# Patient Record
Sex: Male | Born: 1940
Health system: Southern US, Community
[De-identification: ages and names within clinical notes are randomized; demographics above are authoritative.]

## PROBLEM LIST (undated history)

## (undated) DIAGNOSIS — I4510 Unspecified right bundle-branch block: Secondary | ICD-10-CM

## (undated) DIAGNOSIS — J189 Pneumonia, unspecified organism: Secondary | ICD-10-CM

## (undated) DIAGNOSIS — K219 Gastro-esophageal reflux disease without esophagitis: Secondary | ICD-10-CM

## (undated) DIAGNOSIS — C9000 Multiple myeloma not having achieved remission: Secondary | ICD-10-CM

## (undated) DIAGNOSIS — Z8719 Personal history of other diseases of the digestive system: Secondary | ICD-10-CM

## (undated) DIAGNOSIS — J449 Chronic obstructive pulmonary disease, unspecified: Secondary | ICD-10-CM

## (undated) DIAGNOSIS — R29898 Other symptoms and signs involving the musculoskeletal system: Secondary | ICD-10-CM

## (undated) DIAGNOSIS — R7303 Prediabetes: Secondary | ICD-10-CM

## (undated) DIAGNOSIS — Z974 Presence of external hearing-aid: Secondary | ICD-10-CM

## (undated) DIAGNOSIS — I10 Essential (primary) hypertension: Secondary | ICD-10-CM

## (undated) DIAGNOSIS — E039 Hypothyroidism, unspecified: Secondary | ICD-10-CM

## (undated) DIAGNOSIS — Z87442 Personal history of urinary calculi: Secondary | ICD-10-CM

## (undated) DIAGNOSIS — W3400XA Accidental discharge from unspecified firearms or gun, initial encounter: Secondary | ICD-10-CM

## (undated) DIAGNOSIS — H409 Unspecified glaucoma: Secondary | ICD-10-CM

## (undated) DIAGNOSIS — I714 Abdominal aortic aneurysm, without rupture, unspecified: Secondary | ICD-10-CM

## (undated) DIAGNOSIS — M1990 Unspecified osteoarthritis, unspecified site: Secondary | ICD-10-CM

## (undated) DIAGNOSIS — R0600 Dyspnea, unspecified: Secondary | ICD-10-CM

## (undated) DIAGNOSIS — M75100 Unspecified rotator cuff tear or rupture of unspecified shoulder, not specified as traumatic: Secondary | ICD-10-CM

## (undated) DIAGNOSIS — I700 Atherosclerosis of aorta: Secondary | ICD-10-CM

## (undated) DIAGNOSIS — Y249XXA Unspecified firearm discharge, undetermined intent, initial encounter: Secondary | ICD-10-CM

## (undated) DIAGNOSIS — E119 Type 2 diabetes mellitus without complications: Secondary | ICD-10-CM

## (undated) HISTORY — PX: THYROIDECTOMY: SHX17

## (undated) HISTORY — PX: CARPAL TUNNEL RELEASE: SHX101

## (undated) HISTORY — PX: BONE MARROW TRANSPLANT: SHX200

## (undated) HISTORY — PX: ABDOMINAL SURGERY: SHX537

## (undated) HISTORY — DX: Multiple myeloma not having achieved remission: C90.00

## (undated) HISTORY — PX: HERNIA REPAIR: SHX51

## (undated) HISTORY — DX: Chronic obstructive pulmonary disease, unspecified: J44.9

## (undated) HISTORY — DX: Abdominal aortic aneurysm, without rupture: I71.4

## (undated) HISTORY — PX: MIDDLE EAR SURGERY: SHX713

## (undated) HISTORY — DX: Unspecified rotator cuff tear or rupture of unspecified shoulder, not specified as traumatic: M75.100

## (undated) HISTORY — DX: Abdominal aortic aneurysm, without rupture, unspecified: I71.40

---

## 1998-03-25 DIAGNOSIS — E890 Postprocedural hypothyroidism: Secondary | ICD-10-CM | POA: Insufficient documentation

## 1998-03-25 DIAGNOSIS — J449 Chronic obstructive pulmonary disease, unspecified: Secondary | ICD-10-CM | POA: Insufficient documentation

## 1998-03-25 DIAGNOSIS — K219 Gastro-esophageal reflux disease without esophagitis: Secondary | ICD-10-CM | POA: Insufficient documentation

## 2003-03-26 DIAGNOSIS — C9001 Multiple myeloma in remission: Secondary | ICD-10-CM | POA: Insufficient documentation

## 2006-03-07 ENCOUNTER — Emergency Department: Payer: Self-pay | Admitting: Unknown Physician Specialty

## 2006-03-25 DIAGNOSIS — Z9481 Bone marrow transplant status: Secondary | ICD-10-CM

## 2006-03-25 HISTORY — DX: Bone marrow transplant status: Z94.81

## 2006-10-24 HISTORY — PX: BONE MARROW TRANSPLANT: SHX200

## 2007-02-23 HISTORY — PX: BONE MARROW TRANSPLANT: SHX200

## 2007-12-24 ENCOUNTER — Ambulatory Visit: Payer: Self-pay | Admitting: Internal Medicine

## 2007-12-25 ENCOUNTER — Inpatient Hospital Stay: Payer: Self-pay | Admitting: Internal Medicine

## 2007-12-25 ENCOUNTER — Other Ambulatory Visit: Payer: Self-pay

## 2007-12-27 DIAGNOSIS — Z86711 Personal history of pulmonary embolism: Secondary | ICD-10-CM | POA: Insufficient documentation

## 2008-01-08 ENCOUNTER — Ambulatory Visit: Payer: Self-pay | Admitting: Internal Medicine

## 2008-01-24 ENCOUNTER — Ambulatory Visit: Payer: Self-pay | Admitting: Internal Medicine

## 2008-02-23 ENCOUNTER — Ambulatory Visit: Payer: Self-pay | Admitting: Internal Medicine

## 2008-03-25 ENCOUNTER — Ambulatory Visit: Payer: Self-pay | Admitting: Internal Medicine

## 2008-03-25 DIAGNOSIS — I2699 Other pulmonary embolism without acute cor pulmonale: Secondary | ICD-10-CM

## 2008-03-25 HISTORY — DX: Other pulmonary embolism without acute cor pulmonale: I26.99

## 2008-03-26 LAB — HM COLONOSCOPY

## 2008-03-31 ENCOUNTER — Ambulatory Visit: Payer: Self-pay | Admitting: Internal Medicine

## 2008-04-20 ENCOUNTER — Ambulatory Visit: Payer: Self-pay | Admitting: Family Medicine

## 2008-04-25 ENCOUNTER — Ambulatory Visit: Payer: Self-pay | Admitting: Internal Medicine

## 2008-05-23 ENCOUNTER — Ambulatory Visit: Payer: Self-pay | Admitting: Internal Medicine

## 2008-06-23 ENCOUNTER — Ambulatory Visit: Payer: Self-pay | Admitting: Internal Medicine

## 2008-07-23 ENCOUNTER — Ambulatory Visit: Payer: Self-pay | Admitting: Internal Medicine

## 2008-08-23 ENCOUNTER — Ambulatory Visit: Payer: Self-pay | Admitting: Internal Medicine

## 2008-09-22 ENCOUNTER — Ambulatory Visit: Payer: Self-pay | Admitting: Internal Medicine

## 2008-10-23 ENCOUNTER — Ambulatory Visit: Payer: Self-pay | Admitting: Internal Medicine

## 2008-11-23 ENCOUNTER — Ambulatory Visit: Payer: Self-pay | Admitting: Internal Medicine

## 2008-12-23 ENCOUNTER — Ambulatory Visit: Payer: Self-pay | Admitting: Internal Medicine

## 2009-01-23 ENCOUNTER — Ambulatory Visit: Payer: Self-pay | Admitting: Internal Medicine

## 2009-02-22 ENCOUNTER — Ambulatory Visit: Payer: Self-pay | Admitting: Internal Medicine

## 2009-03-25 ENCOUNTER — Ambulatory Visit: Payer: Self-pay | Admitting: Internal Medicine

## 2009-05-23 ENCOUNTER — Ambulatory Visit: Payer: Self-pay | Admitting: Internal Medicine

## 2009-05-26 ENCOUNTER — Ambulatory Visit: Payer: Self-pay | Admitting: Internal Medicine

## 2009-06-23 ENCOUNTER — Ambulatory Visit: Payer: Self-pay | Admitting: Internal Medicine

## 2009-07-28 DIAGNOSIS — R5383 Other fatigue: Secondary | ICD-10-CM | POA: Insufficient documentation

## 2009-08-02 ENCOUNTER — Ambulatory Visit: Payer: Self-pay | Admitting: Family Medicine

## 2009-08-17 IMAGING — NM NM LUNG SCAN
2 series · 15 of 15 positions shown · non-contrast
Comparison: none

REASON FOR EXAM: hypoxemia
COMMENTS:

[Series 1000: lung ventilation · 3.30mm/px · 4 acquisitions, 7 frames shown]
[im 1/4]
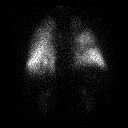
[im 2/4]
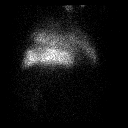
[im 2/4]
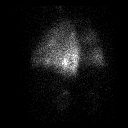
[im 3/4]
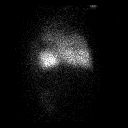
[im 3/4]
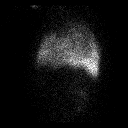
[im 4/4  full-range]
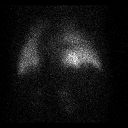
[im 4/4  full-range]
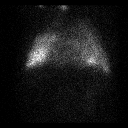

[Series 1000: lung perfusion · 1.65mm/px · 4 acquisitions, 8 frames shown]
[im 1/4]
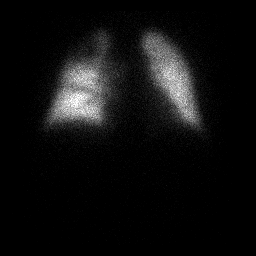
[im 1/4]
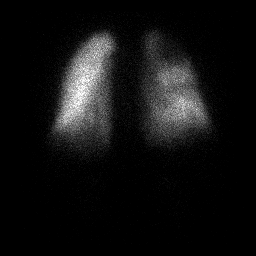
[im 2/4]
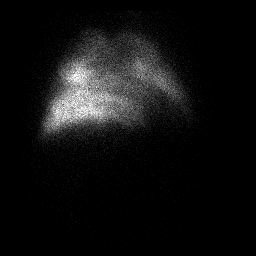
[im 2/4]
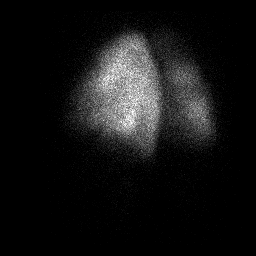
[im 3/4]
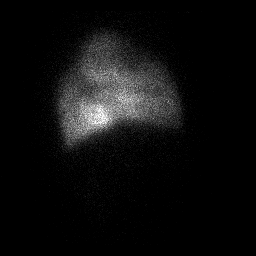
[im 3/4]
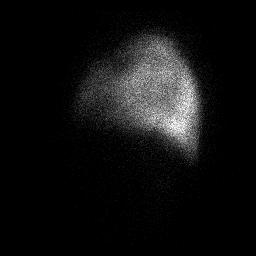
[im 4/4]
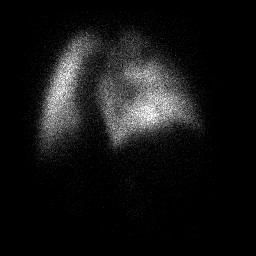
[im 4/4]
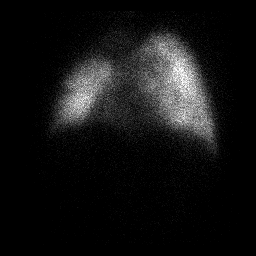

[15 of 15 positions shown; findings below may reference images not displayed]

PROCEDURE:     NM  - NM VQ LUNG SCAN  - [DATE]  [DATE] [DATE]  [DATE]

RESULT:     Ventilation/perfusion lung scan is performed utilizing 37.5 mCi
Tc 99m DTPA in the nebulizer for the ventilation portion and 4.0 mCi of
technetium 99m labeled MAA for the perfusion portion. AP and lateral views
of the chest from 12/26/2007 are available. No current chest x-ray is
available.

The chest x-ray demonstrates increased density in the superior segment of
the RIGHT lower lobe consistent with pneumonia. The lungs otherwise appear
to be grossly clear. The ventilation images show patchy decreased activity
in the superior segment of the RIGHT lower lobe and in the RIGHT upper lobe.
The LEFT lung shows grossly normal tracer localization.

Perfusion images also show abnormal, decreased localization in the RIGHT
upper lobe and superior segment of the RIGHT lower lobe. Correlation with a
current chest x-ray to evaluate for infiltrate in the RIGHT upper lobe would
be helpful.
IMPRESSION: Abnormal ventilation/perfusion lung scan. Decreased
ventilation and perfusion to the RIGHT upper lobe and superior segment of
the RIGHT lower lobe. The probability of pulmonary embolism is intermediate.
Given there is no current chest x-ray for which to compare the RIGHT upper
lobe cannot be evaluated for the possibility of underlying pneumonia.
Activity in the superior segment of the RIGHT lower lobe is a triple matched
defect and likely not accountable or explained because of pulmonary embolism.

## 2009-08-23 ENCOUNTER — Ambulatory Visit: Payer: Self-pay | Admitting: Internal Medicine

## 2009-08-25 ENCOUNTER — Ambulatory Visit: Payer: Self-pay | Admitting: Internal Medicine

## 2009-08-29 DIAGNOSIS — D509 Iron deficiency anemia, unspecified: Secondary | ICD-10-CM | POA: Insufficient documentation

## 2009-09-22 ENCOUNTER — Ambulatory Visit: Payer: Self-pay | Admitting: Internal Medicine

## 2010-01-09 ENCOUNTER — Ambulatory Visit: Payer: Self-pay | Admitting: Internal Medicine

## 2010-01-23 ENCOUNTER — Ambulatory Visit: Payer: Self-pay | Admitting: Internal Medicine

## 2010-05-11 ENCOUNTER — Ambulatory Visit: Payer: Self-pay | Admitting: Internal Medicine

## 2010-05-24 ENCOUNTER — Ambulatory Visit: Payer: Self-pay | Admitting: Internal Medicine

## 2010-08-01 ENCOUNTER — Ambulatory Visit: Payer: Self-pay | Admitting: Family Medicine

## 2010-11-05 ENCOUNTER — Ambulatory Visit: Payer: Self-pay | Admitting: Internal Medicine

## 2010-11-24 ENCOUNTER — Ambulatory Visit: Payer: Self-pay | Admitting: Internal Medicine

## 2010-12-14 ENCOUNTER — Ambulatory Visit: Payer: Self-pay | Admitting: Family Medicine

## 2011-02-25 ENCOUNTER — Ambulatory Visit: Payer: Self-pay | Admitting: Unknown Physician Specialty

## 2011-05-13 ENCOUNTER — Ambulatory Visit: Payer: Self-pay | Admitting: Internal Medicine

## 2011-05-13 LAB — CBC CANCER CENTER
Basophil %: 1.3 %
Eosinophil %: 2.8 %
HCT: 44.9 % (ref 40.0–52.0)
HGB: 15.1 g/dL (ref 13.0–18.0)
Lymphocyte %: 31.6 %
MCH: 31.2 pg (ref 26.0–34.0)
MCV: 93 fL (ref 80–100)
Monocyte #: 0.4 x10 3/mm (ref 0.0–0.7)
Monocyte %: 5.8 %
Neutrophil %: 58.5 %
Platelet: 152 x10 3/mm (ref 150–440)
RBC: 4.84 10*6/uL (ref 4.40–5.90)

## 2011-05-13 LAB — COMPREHENSIVE METABOLIC PANEL
Albumin: 3.6 g/dL (ref 3.4–5.0)
Alkaline Phosphatase: 115 U/L (ref 50–136)
Anion Gap: 5 — ABNORMAL LOW (ref 7–16)
BUN: 14 mg/dL (ref 7–18)
Bilirubin,Total: 0.6 mg/dL (ref 0.2–1.0)
Co2: 31 mmol/L (ref 21–32)
Creatinine: 1.31 mg/dL — ABNORMAL HIGH (ref 0.60–1.30)
EGFR (Non-African Amer.): 57 — ABNORMAL LOW
Glucose: 142 mg/dL — ABNORMAL HIGH (ref 65–99)
Osmolality: 284 (ref 275–301)
SGOT(AST): 19 U/L (ref 15–37)
Sodium: 141 mmol/L (ref 136–145)

## 2011-05-24 ENCOUNTER — Ambulatory Visit: Payer: Self-pay | Admitting: Internal Medicine

## 2011-05-24 ENCOUNTER — Ambulatory Visit: Payer: Self-pay | Admitting: Oncology

## 2011-06-24 ENCOUNTER — Ambulatory Visit: Payer: Self-pay | Admitting: Internal Medicine

## 2011-06-24 ENCOUNTER — Ambulatory Visit: Payer: Self-pay | Admitting: Oncology

## 2011-07-01 ENCOUNTER — Ambulatory Visit: Payer: Self-pay | Admitting: Family Medicine

## 2011-11-29 ENCOUNTER — Ambulatory Visit: Payer: Self-pay | Admitting: Oncology

## 2011-11-29 LAB — CBC CANCER CENTER
Basophil %: 0.8 %
Eosinophil #: 0.1 x10 3/mm (ref 0.0–0.7)
HGB: 14.4 g/dL (ref 13.0–18.0)
Lymphocyte %: 34.1 %
MCHC: 32.5 g/dL (ref 32.0–36.0)
MCV: 93 fL (ref 80–100)
Monocyte #: 0.4 x10 3/mm (ref 0.2–1.0)
Neutrophil #: 3 x10 3/mm (ref 1.4–6.5)
Neutrophil %: 55 %
RBC: 4.75 10*6/uL (ref 4.40–5.90)
WBC: 5.5 x10 3/mm (ref 3.8–10.6)

## 2011-11-29 LAB — BASIC METABOLIC PANEL
Anion Gap: 5 — ABNORMAL LOW (ref 7–16)
BUN: 17 mg/dL (ref 7–18)
Chloride: 105 mmol/L (ref 98–107)
Co2: 30 mmol/L (ref 21–32)
Creatinine: 1.29 mg/dL (ref 0.60–1.30)
EGFR (African American): >60
Potassium: 4.2 mmol/L (ref 3.5–5.1)

## 2011-12-02 LAB — PROT IMMUNOELECTROPHORES(ARMC)

## 2011-12-24 ENCOUNTER — Ambulatory Visit: Payer: Self-pay | Admitting: Oncology

## 2012-07-15 ENCOUNTER — Ambulatory Visit: Payer: Self-pay | Admitting: Ophthalmology

## 2012-08-18 ENCOUNTER — Ambulatory Visit: Payer: Self-pay | Admitting: Family Medicine

## 2014-03-29 ENCOUNTER — Ambulatory Visit: Payer: Self-pay | Admitting: Family Medicine

## 2014-03-29 LAB — BASIC METABOLIC PANEL
BUN: 15 mg/dL (ref 4–21)
GLUCOSE: 102 mg/dL
Potassium: 4.4 mmol/L (ref 3.4–5.3)
SODIUM: 139 mmol/L (ref 137–147)

## 2014-03-29 LAB — CBC AND DIFFERENTIAL
HCT: 45 % (ref 41–53)
HEMOGLOBIN: 14.8 g/dL (ref 13.5–17.5)
Platelets: 152 10*3/uL (ref 150–399)
WBC: 6.8 10^3/mL

## 2014-03-29 LAB — HEPATIC FUNCTION PANEL
ALT: 12 U/L (ref 10–40)
AST: 17 U/L (ref 14–40)

## 2014-03-29 LAB — HEMOGLOBIN A1C: Hgb A1c MFr Bld: 6.3 % — AB (ref 4.0–6.0)

## 2014-06-24 ENCOUNTER — Ambulatory Visit: Payer: Self-pay

## 2014-07-20 LAB — TSH: TSH: 16.7 u[IU]/mL — AB (ref <=5.90)

## 2014-07-29 ENCOUNTER — Telehealth: Payer: Self-pay | Admitting: Family Medicine

## 2014-08-12 NOTE — Telephone Encounter (Signed)
A user error has taken place: encounter opened in error, closed for administrative reasons.

## 2014-09-11 ENCOUNTER — Telehealth: Payer: Self-pay | Admitting: Family Medicine

## 2014-09-11 DIAGNOSIS — E039 Hypothyroidism, unspecified: Secondary | ICD-10-CM

## 2014-09-11 NOTE — Telephone Encounter (Signed)
Please advise patient it is time to recheck thyroid functions. Please enter order for TSH for hypothyroid and have patient pick up rx at front desk. Thanks.

## 2014-09-13 NOTE — Telephone Encounter (Signed)
Left message on vm. Order entered and req at front desk.

## 2014-09-14 LAB — TSH: TSH: 3.54 u[IU]/mL (ref 0.450–4.500)

## 2014-09-15 ENCOUNTER — Telehealth: Payer: Self-pay | Admitting: *Deleted

## 2014-09-15 NOTE — Telephone Encounter (Signed)
Patient's wife Ryan Ali requested refill for pt's Levothyroxine 125 mcg. Requesting 90 day supply sent to [facility] in [place].

## 2014-09-16 MED ORDER — LEVOTHYROXINE SODIUM 125 MCG PO TABS: 125.0000 ug | ORAL_TABLET | Freq: Every day | ORAL | Status: DC

## 2014-12-16 ENCOUNTER — Encounter: Payer: Self-pay | Admitting: Family Medicine

## 2014-12-16 ENCOUNTER — Ambulatory Visit (INDEPENDENT_AMBULATORY_CARE_PROVIDER_SITE_OTHER): Payer: PPO | Admitting: Family Medicine

## 2014-12-16 VITALS — BP 102/68 | HR 90 | Temp 98.1°F | Resp 16 | Ht 67.0 in | Wt 221.0 lb

## 2014-12-16 DIAGNOSIS — I714 Abdominal aortic aneurysm, without rupture, unspecified: Secondary | ICD-10-CM | POA: Insufficient documentation

## 2014-12-16 DIAGNOSIS — E119 Type 2 diabetes mellitus without complications: Secondary | ICD-10-CM | POA: Insufficient documentation

## 2014-12-16 DIAGNOSIS — T8600 Unspecified complication of bone marrow transplant: Secondary | ICD-10-CM | POA: Insufficient documentation

## 2014-12-16 DIAGNOSIS — J441 Chronic obstructive pulmonary disease with (acute) exacerbation: Secondary | ICD-10-CM | POA: Insufficient documentation

## 2014-12-16 DIAGNOSIS — J449 Chronic obstructive pulmonary disease, unspecified: Secondary | ICD-10-CM | POA: Diagnosis not present

## 2014-12-16 DIAGNOSIS — G4710 Hypersomnia, unspecified: Secondary | ICD-10-CM | POA: Insufficient documentation

## 2014-12-16 DIAGNOSIS — C9001 Multiple myeloma in remission: Secondary | ICD-10-CM | POA: Diagnosis not present

## 2014-12-16 DIAGNOSIS — R7303 Prediabetes: Secondary | ICD-10-CM | POA: Insufficient documentation

## 2014-12-16 DIAGNOSIS — M542 Cervicalgia: Secondary | ICD-10-CM | POA: Insufficient documentation

## 2014-12-16 DIAGNOSIS — K2270 Barrett's esophagus without dysplasia: Secondary | ICD-10-CM | POA: Insufficient documentation

## 2014-12-16 DIAGNOSIS — M1990 Unspecified osteoarthritis, unspecified site: Secondary | ICD-10-CM | POA: Insufficient documentation

## 2014-12-16 MED ORDER — AZITHROMYCIN 250 MG PO TABS: ORAL_TABLET | ORAL | Status: AC

## 2014-12-16 MED ORDER — PREDNISONE 10 MG PO TABS: ORAL_TABLET | ORAL | Status: AC

## 2014-12-16 NOTE — Progress Notes (Signed)
Patient ID: Ryan Ali, male   DOB: 1940/09/13, 74 y.o.   MRN: [id number]       Patient: Ryan Ali Male    DOB: August 30, 1940   74 y.o.   MRN: [id number] Visit Date: 12/16/2014  Today's Provider: [name], MD   Chief Complaint  Patient presents with  . Cough    X 5 days.    Subjective:    Cough This is a new problem. The current episode started in the past 7 days. The problem has been gradually worsening. The problem occurs constantly. The cough is productive of sputum. Associated symptoms include postnasal drip and shortness of breath. Pertinent negatives include no fever, headaches or sore throat. Nothing aggravates the symptoms. He has tried OTC cough suppressant for the symptoms. The treatment provided mild relief. His past medical history is significant for COPD.  He had been traveling last week and flew home last week-end. Cough and wheezing started the next day. Has had no allergy or other URI symptoms. Is using Symbicort prescribed by the [facility] consistently. Occasionally using Proventil.      Allergies no known allergies Previous Medications   ALBUTEROL (PROVENTIL HFA) 108 (90 BASE) MCG/ACT INHALER    PROVENTIL HFA, 108 (90 Base)MCG/ACT (Inhalation Aerosol Solution)  for 0 days  Quantity: 0.00;  Refills: 0   Ordered :16-Jan-2010  [name] ;  Started 13-Jan-2008 Active   ASPIRIN 81 MG TABLET    Take 1 tablet by mouth daily.   CALCIUM CARBONATE-VIT D-MIN (CALCIUM 600+D PLUS MINERALS) 600-400 MG-UNIT CHEW    Chew 3 tablets by mouth daily.   GLUCOSE BLOOD VI    Freestyle test strips. Check blood sugar daily.   HYDROCODONE-ACETAMINOPHEN (VICODIN) 5-500 MG PER TABLET    Take 2 tablets by mouth Nightly.   LEVOTHYROXINE (SYNTHROID, LEVOTHROID) 125 MCG TABLET    Take 1 tablet (125 mcg total) by mouth daily before breakfast.   Symbicort Inhaler    Prescribed at [place]. Patient does not recall dose.    OMEPRAZOLE 20 MG TBEC    Take 2 tablets by  mouth daily.   TIOTROPIUM (SPIRIVA HANDIHALER) [place], 18MCG (Inhalation Capsule)  for 0 days  Quantity: 0.00;  Refills: 0   Ordered :16-Jan-2010  [name] ;  Started 13-Jan-2008 Active    Review of Systems  Constitutional: Positive for activity change and appetite change. Negative for fever.  HENT: Positive for postnasal drip. Negative for sore throat.   Respiratory: Positive for cough and shortness of breath.   Cardiovascular: Negative.   Gastrointestinal: Positive for nausea.  Neurological: Negative for headaches.    Social History  Substance Use Topics  . Smoking status: Former Smoker    Quit date: 03/25/2005  . Smokeless tobacco: Not on file  . Alcohol Use: No   Objective:   BP 102/68 mmHg  Pulse 90  Temp(Src) 98.1 F (36.7 C)  Resp 16  Ht _0  (1.702 m)  Wt 221 lb (100.245 kg)  BMI 34.61 kg/m2  SpO2 95%  Physical Exam   General Appearance:    Alert, cooperative, no distress  HEENT:   Clear. No congestion or drainage, No LAD  Eyes:    PERRL, conjunctiva/corneas clear, EOM's intact       Lungs:     Diminished breath sounds. Mild diffuse expiratory wheezes respirations unlabored  Heart:    Regular rate and rhythm, distant heart sounds.   Neurologic:  Awake, alert, oriented x 3. No apparent focal neurological           defect.            Assessment & Plan:     1. COPD with exacerbation  - predniSONE (DELTASONE) 10 MG tablet; 6 tablets for 2 days, then 5 for 2 days, then 4 for 2 days, then 3 for 2 days, then 2 for 2 days, then 1 for 2 days.  Dispense: 42 tablet; Refill: 0 - azithromycin (ZITHROMAX) 250 MG tablet; 2 by mouth today, then 1 daily for 4 days  Dispense: 6 tablet; Refill: 0  2. Multiple myeloma in remission Return for flu vaccine when finished with prednisone  3. Chronic obstructive pulmonary disease, unspecified COPD, unspecified chronic bronchitis type Continue current maintenance  inhalers.     Call if symptoms change or if not rapidly improving.          [name], MD  [facility]

## 2015-05-12 ENCOUNTER — Ambulatory Visit (INDEPENDENT_AMBULATORY_CARE_PROVIDER_SITE_OTHER): Payer: PPO | Admitting: Family Medicine

## 2015-05-12 ENCOUNTER — Encounter: Payer: Self-pay | Admitting: Family Medicine

## 2015-05-12 VITALS — BP 130/86 | HR 88 | Temp 98.0°F | Resp 18 | Wt 227.0 lb

## 2015-05-12 DIAGNOSIS — E890 Postprocedural hypothyroidism: Secondary | ICD-10-CM | POA: Diagnosis not present

## 2015-05-12 DIAGNOSIS — J449 Chronic obstructive pulmonary disease, unspecified: Secondary | ICD-10-CM | POA: Diagnosis not present

## 2015-05-12 DIAGNOSIS — C9001 Multiple myeloma in remission: Secondary | ICD-10-CM | POA: Diagnosis not present

## 2015-05-12 DIAGNOSIS — E119 Type 2 diabetes mellitus without complications: Secondary | ICD-10-CM | POA: Diagnosis not present

## 2015-05-12 DIAGNOSIS — Z125 Encounter for screening for malignant neoplasm of prostate: Secondary | ICD-10-CM | POA: Diagnosis not present

## 2015-05-12 DIAGNOSIS — D509 Iron deficiency anemia, unspecified: Secondary | ICD-10-CM

## 2015-05-12 LAB — POCT UA - MICROALBUMIN: Microalbumin Ur, POC: 20 mg/L

## 2015-05-12 NOTE — Progress Notes (Signed)
Patient: Ryan Ali Male    DOB: 1940/06/15   75 y.o.   MRN: [id number] Visit Date: 05/12/2015  Today's Provider: [name], MD   Chief Complaint  Patient presents with  . Diabetes    follow up  . Hypothyroidism    follow up   Subjective:    HPI  Diabetes Mellitus Type II, Follow-up:   Lab Results  Component Value Date   HGBA1C 6.3* 03/29/2014    Last seen for diabetes 1 years ago.  Management since then includes no changes. He reports good compliance with treatment. He is not having side effects.  Current symptoms include paresthesia of the feet and have been stable. Home blood sugar records: fasting range: not being checked  Episodes of hypoglycemia? no   Current Insulin Regimen: none Most Recent Eye Exam:  1 year ago Weight trend: increasing steadily Prior visit with dietician: no Current diet: in general, an "unhealthy" diet Current exercise: none  Pertinent Labs:    Component Value Date/Time   CREATININE 1.29 11/29/2011 0851    Wt Readings from Last 3 Encounters:  05/12/15 227 lb (102.967 kg)  12/16/14 221 lb (100.245 kg)  03/29/14 218 lb (98.884 kg)    ------------------------------------------------------------------------  Follow up Hypothyroidism:  Last office visit was 1 year ago. Labs were last checked on 09/13/2014. TSH was 3.540 and no changes were made. Patent reports good compliance with treatment and good tolerance. He states he has been much more fatigued lately. Is followed by Oncology at [facility] and states hemoglobin is in the 14-15 range.   COPD Still gets short of breath on exertion, but no worse over the last few year. PCP at [facility] has added Symbicort which helps. Occasionally uses albuterol.   Elevated BP States BP was very high at oncologist and when getting PT for shoulder.      No Known Allergies Previous Medications   ALBUTEROL (PROVENTIL HFA) 108 (90 BASE) MCG/ACT INHALER    PROVENTIL HFA, 108 (90 Base)MCG/ACT  (Inhalation Aerosol Solution)  for 0 days  Quantity: 0.00;  Refills: 0   Ordered :16-Jan-2010  [name] ;  Started 13-Jan-2008 Active   ASPIRIN 81 MG TABLET    Take 1 tablet by mouth daily. Reported on 05/12/2015   CALCIUM CARBONATE-VIT D-MIN (CALCIUM 600+D PLUS MINERALS) 600-400 MG-UNIT CHEW    Chew 3 tablets by mouth daily.   GLUCOSE BLOOD VI    Freestyle test strips. Check blood sugar daily.   HYDROCODONE-ACETAMINOPHEN (VICODIN) 5-500 MG PER TABLET    Take 2 tablets by mouth Nightly.   LEVOTHYROXINE (SYNTHROID, LEVOTHROID) 125 MCG TABLET    Take 1 tablet (125 mcg total) by mouth daily before breakfast.   OMEPRAZOLE 20 MG TBEC    Take 2 tablets by mouth daily.   TIOTROPIUM (SPIRIVA HANDIHALER) [place], 18MCG (Inhalation Capsule)  for 0 days  Quantity: 0.00;  Refills: 0   Ordered :16-Jan-2010  [name] ;  Started 13-Jan-2008 Active    Review of Systems  Constitutional: Negative for fever, chills and appetite change.  Respiratory: Negative for chest tightness, shortness of breath and wheezing.   Cardiovascular: Negative for chest pain and palpitations.  Gastrointestinal: Negative for nausea, vomiting and abdominal pain.  Endocrine: Negative for cold intolerance, heat intolerance, polydipsia and polyuria.  Musculoskeletal:       Joints lock up in hands occasionally  Neurological: Positive for headaches. Negative for dizziness and light-headedness.  Social History  Substance Use Topics  . Smoking status: Former Smoker    Quit date: 03/25/2005  . Smokeless tobacco: Not on file  . Alcohol Use: No   Objective:   BP 138/80 mmHg  Pulse 88  Temp(Src) 98 F (36.7 C) (Oral)  Resp 18  Wt 227 lb (102.967 kg)  Physical Exam  General Appearance:    Alert, cooperative, no distress, obese  Eyes:    PERRL, conjunctiva/corneas clear, EOM's intact       Lungs:     Clear to auscultation bilaterally, respirations unlabored    Heart:    Regular rate and rhythm  Neurologic:   Awake, alert, oriented x 3. No apparent focal neurological           defect.         Results for orders placed or performed in visit on 05/12/15  POCT UA - Microalbumin  Result Value Ref Range   Microalbumin Ur, POC 20 mg/L   Creatinine, POC n/a mg/dL   Albumin/Creatinine Ratio, Urine, POC n/a        Assessment & Plan:     1. Controlled type 2 diabetes mellitus without complication, without long-term current use of insulin (HCC)  - POCT UA - Microalbumin - Basic metabolic panel - Hemoglobin A1c - Lipid panel  2. Multiple myeloma in remission [facility]) Continue routine follow up at [facility]  3. Hypothyroidism, postop  - TSH  4. Chronic obstructive pulmonary disease, unspecified COPD type ([place]) Stable on current inhalers.   5. Prostate cancer screening  - PSA       [name], MD  [facility]

## 2015-05-16 DIAGNOSIS — E119 Type 2 diabetes mellitus without complications: Secondary | ICD-10-CM | POA: Diagnosis not present

## 2015-05-16 DIAGNOSIS — Z125 Encounter for screening for malignant neoplasm of prostate: Secondary | ICD-10-CM | POA: Diagnosis not present

## 2015-05-16 DIAGNOSIS — E890 Postprocedural hypothyroidism: Secondary | ICD-10-CM | POA: Diagnosis not present

## 2015-05-17 LAB — BASIC METABOLIC PANEL
BUN / CREAT RATIO: 13 (ref 10–22)
BUN: 17 mg/dL (ref 8–27)
CALCIUM: 8.9 mg/dL (ref 8.6–10.2)
CO2: 25 mmol/L (ref 18–29)
CREATININE: 1.29 mg/dL — AB (ref 0.76–1.27)
Chloride: 101 mmol/L (ref 96–106)
GFR calc non Af Amer: 54 mL/min/{1.73_m2} — ABNORMAL LOW (ref >59)
GFR, EST AFRICAN AMERICAN: 62 mL/min/{1.73_m2} (ref >59)
GLUCOSE: 98 mg/dL (ref 65–99)
Potassium: 4.6 mmol/L (ref 3.5–5.2)
SODIUM: 142 mmol/L (ref 134–144)

## 2015-05-17 LAB — LIPID PANEL
CHOLESTEROL TOTAL: 184 mg/dL (ref 100–199)
Chol/HDL Ratio: 4.2 ratio units (ref 0.0–5.0)
HDL: 44 mg/dL (ref >39)
LDL CALC: 120 mg/dL — AB (ref 0–99)
TRIGLYCERIDES: 102 mg/dL (ref 0–149)
VLDL Cholesterol Cal: 20 mg/dL (ref 5–40)

## 2015-05-17 LAB — TSH: TSH: 36.22 u[IU]/mL — AB (ref 0.450–4.500)

## 2015-05-17 LAB — HEMOGLOBIN A1C
Est. average glucose Bld gHb Est-mCnc: 137 mg/dL
HEMOGLOBIN A1C: 6.4 % — AB (ref 4.8–5.6)

## 2015-05-17 LAB — PSA: PROSTATE SPECIFIC AG, SERUM: 0.5 ng/mL (ref 0.0–4.0)

## 2015-05-18 ENCOUNTER — Telehealth: Payer: Self-pay

## 2015-05-18 DIAGNOSIS — E039 Hypothyroidism, unspecified: Secondary | ICD-10-CM

## 2015-05-18 MED ORDER — LEVOTHYROXINE SODIUM 150 MCG PO TABS: 150.0000 ug | ORAL_TABLET | Freq: Every day | ORAL | Status: DC

## 2015-05-18 NOTE — Telephone Encounter (Signed)
-----   Message from Birdie Sons, MD sent at 05/17/2015  7:42 AM EST ----- Increase levothyroxine to 128mcg daily, #30, rf x 3. A1c is good at 6.4. Cholesterol is good at 184. Follow up to check TSH in 6 weeks.

## 2015-05-18 NOTE — Telephone Encounter (Signed)
Patient wife Ryan Ali advised. Prescription sent into pharmacy. Patient will call back to schedule 6 week follow up.

## 2015-05-24 DIAGNOSIS — H401131 Primary open-angle glaucoma, bilateral, mild stage: Secondary | ICD-10-CM | POA: Diagnosis not present

## 2015-05-29 DIAGNOSIS — H401131 Primary open-angle glaucoma, bilateral, mild stage: Secondary | ICD-10-CM | POA: Diagnosis not present

## 2015-06-19 DIAGNOSIS — H7012 Chronic mastoiditis, left ear: Secondary | ICD-10-CM | POA: Diagnosis not present

## 2015-06-19 DIAGNOSIS — H903 Sensorineural hearing loss, bilateral: Secondary | ICD-10-CM | POA: Diagnosis not present

## 2015-07-12 ENCOUNTER — Telehealth: Payer: Self-pay

## 2015-07-12 DIAGNOSIS — E890 Postprocedural hypothyroidism: Secondary | ICD-10-CM

## 2015-07-12 NOTE — Telephone Encounter (Signed)
Please advise 

## 2015-07-12 NOTE — Telephone Encounter (Signed)
Patient's wife is requesting a lab slip to have patient's thyroid level check. She is requesting to have this ready by Friday. Thanks!

## 2015-07-13 NOTE — Telephone Encounter (Signed)
Lab slip ready and placed up front for pick up. Tried calling patient. Left detailed message on voice message system that Lab slip is ready for pick up.

## 2015-07-17 DIAGNOSIS — E890 Postprocedural hypothyroidism: Secondary | ICD-10-CM | POA: Diagnosis not present

## 2015-07-18 ENCOUNTER — Other Ambulatory Visit: Payer: Self-pay | Admitting: Family Medicine

## 2015-07-18 DIAGNOSIS — E039 Hypothyroidism, unspecified: Secondary | ICD-10-CM

## 2015-07-18 LAB — T4 AND TSH
T4 TOTAL: 9.1 ug/dL (ref 4.5–12.0)
TSH: 1.78 u[IU]/mL (ref 0.450–4.500)

## 2015-07-18 MED ORDER — LEVOTHYROXINE SODIUM 150 MCG PO TABS
150.0000 ug | ORAL_TABLET | Freq: Every day | ORAL | Status: DC
Start: 2015-07-18 — End: 2016-07-18

## 2015-07-18 NOTE — Telephone Encounter (Signed)
Requesting 90 day supply.

## 2015-07-18 NOTE — Telephone Encounter (Signed)
Pt contacted office for refill request on the following medications: levothyroxine (SYNTHROID, LEVOTHROID) 150 MCG tablet. To [facility] in [place]. 90 day supply since pt's blood work came back. Thanks TNP

## 2015-11-28 DIAGNOSIS — H401131 Primary open-angle glaucoma, bilateral, mild stage: Secondary | ICD-10-CM | POA: Diagnosis not present

## 2016-01-11 DIAGNOSIS — H7012 Chronic mastoiditis, left ear: Secondary | ICD-10-CM | POA: Diagnosis not present

## 2016-01-11 DIAGNOSIS — H903 Sensorineural hearing loss, bilateral: Secondary | ICD-10-CM | POA: Diagnosis not present

## 2016-02-19 ENCOUNTER — Ambulatory Visit (INDEPENDENT_AMBULATORY_CARE_PROVIDER_SITE_OTHER): Payer: PPO | Admitting: Family Medicine

## 2016-02-19 ENCOUNTER — Encounter: Payer: Self-pay | Admitting: Family Medicine

## 2016-02-19 VITALS — BP 108/70 | HR 64 | Temp 97.6°F | Resp 16 | Wt 221.0 lb

## 2016-02-19 DIAGNOSIS — R42 Dizziness and giddiness: Secondary | ICD-10-CM

## 2016-02-19 MED ORDER — MECLIZINE HCL 25 MG PO TABS
ORAL_TABLET | ORAL | 0 refills | Status: DC
Start: 2016-02-19 — End: 2019-11-11

## 2016-02-19 NOTE — Progress Notes (Signed)
Subjective:     Patient ID: Ryan Ali, male   DOB: 05-May-1940, 75 y.o.   MRN: [id number]  HPI  Chief Complaint  Patient presents with  . Dizziness    x 2 weeks. Wife has been checking BP at home, which has been elevated (ranging from 148/89- 154/90). Wife gave pt Atenolol 25 mg this morning so he wouldn't have a "stroke". BP is 108/70 in the office today. Pt reports the dizziness is NOT exacerbated by sudden movement. Denies otalgia/ear pressure. Has been coughing for a couple weeks, per pt.  States he has experienced both vertigo and non-vertigo dizziness lasting from three to 20 minutes. Reports mild allergy like symptoms with clear nasal drainage and sneezing. Wife reports he has been raking a lot of leaves. States he does see ENT.Dr. Tami Ribas, occasionally when he develops fluid behind his eardrum. Has tried Claritin for his sx. He is accompanied by his wife today.   Review of Systems     Objective:   Physical Exam  Constitutional: He appears well-developed and well-nourished. No distress ( hard of hearing).  HENT:  Right Ear: Ear canal normal.  Left Ear: Tympanic membrane is scarred.  Eyes: EOM are normal. Pupils are equal, round, and reactive to light.  Neck: Carotid bruit is not present.  Cardiovascular: Normal rate and regular rhythm.   Pulmonary/Chest: Breath sounds normal.  Musculoskeletal: He exhibits no edema (of lower extremities).  Grip strength 5/5  Neurological: Abnormal coordination:  Romberg negative,  Can not perform heel to toe.       Assessment:    1. Dizziness - CBC with Differential/Platelet - Renal function panel - meclizine (ANTIVERT) 25 MG tablet; 1/2 to one pill 3 x day as needed for vertigo  Dispense: 12 tablet; Refill: 0    Plan:    Further f/u pending lab work. Nurse bp checks over the next week or two.

## 2016-02-19 NOTE — Patient Instructions (Addendum)
We will call you with the lab results. Consider nurse bp checks over the next week or two.

## 2016-02-20 ENCOUNTER — Telehealth: Payer: Self-pay

## 2016-02-20 LAB — CBC WITH DIFFERENTIAL/PLATELET
BASOS ABS: 0 10*3/uL (ref 0.0–0.2)
BASOS: 0 %
EOS (ABSOLUTE): 0.1 10*3/uL (ref 0.0–0.4)
Eos: 2 %
Hematocrit: 42.7 % (ref 37.5–51.0)
Hemoglobin: 13.5 g/dL (ref 12.6–17.7)
IMMATURE GRANS (ABS): 0 10*3/uL (ref 0.0–0.1)
IMMATURE GRANULOCYTES: 0 %
LYMPHS: 25 %
Lymphocytes Absolute: 1.9 10*3/uL (ref 0.7–3.1)
MCH: 26.5 pg — ABNORMAL LOW (ref 26.6–33.0)
MCHC: 31.6 g/dL (ref 31.5–35.7)
MCV: 84 fL (ref 79–97)
MONOS ABS: 0.5 10*3/uL (ref 0.1–0.9)
Monocytes: 7 %
NEUTROS PCT: 66 %
Neutrophils Absolute: 5.1 10*3/uL (ref 1.4–7.0)
PLATELETS: 116 10*3/uL — AB (ref 150–379)
RBC: 5.1 x10E6/uL (ref 4.14–5.80)
RDW: 16.1 % — AB (ref 12.3–15.4)
WBC: 7.6 10*3/uL (ref 3.4–10.8)

## 2016-02-20 LAB — RENAL FUNCTION PANEL
Albumin: 4 g/dL (ref 3.5–4.8)
BUN / CREAT RATIO: 13 (ref 10–24)
BUN: 17 mg/dL (ref 8–27)
CALCIUM: 8.9 mg/dL (ref 8.6–10.2)
CHLORIDE: 103 mmol/L (ref 96–106)
CO2: 24 mmol/L (ref 18–29)
Creatinine, Ser: 1.26 mg/dL (ref 0.76–1.27)
GFR calc non Af Amer: 55 mL/min/{1.73_m2} — ABNORMAL LOW (ref >59)
GFR, EST AFRICAN AMERICAN: 64 mL/min/{1.73_m2} (ref >59)
GLUCOSE: 111 mg/dL — AB (ref 65–99)
Phosphorus: 3.6 mg/dL (ref 2.5–4.5)
Potassium: 4.5 mmol/L (ref 3.5–5.2)
SODIUM: 142 mmol/L (ref 134–144)

## 2016-02-20 NOTE — Telephone Encounter (Signed)
-----   Message from Carmon Ginsberg, [place] sent at 02/20/2016  7:16 AM EST ----- Labs are stable. Do come in for a nurse bp check this week.

## 2016-02-20 NOTE — Telephone Encounter (Signed)
Pt's wife advised. Pt has appointment with Dr. Caryn Section tomorrow. Advised wife we will FU on his BP at that time. Ryan Ali, CMA

## 2016-02-21 ENCOUNTER — Ambulatory Visit: Payer: PPO | Admitting: Family Medicine

## 2016-02-21 NOTE — Progress Notes (Signed)
Have him f/u with Dr. Caryn Section sometime over the next 7days

## 2016-02-21 NOTE — Progress Notes (Signed)
[id number] 

## 2016-02-23 NOTE — Progress Notes (Signed)
Wife advised-aa 

## 2016-02-23 NOTE — Progress Notes (Signed)
[id number] 

## 2016-03-27 ENCOUNTER — Telehealth: Payer: Self-pay | Admitting: Family Medicine

## 2016-05-02 ENCOUNTER — Ambulatory Visit (INDEPENDENT_AMBULATORY_CARE_PROVIDER_SITE_OTHER): Payer: PPO | Admitting: Family Medicine

## 2016-05-02 ENCOUNTER — Encounter: Payer: Self-pay | Admitting: Family Medicine

## 2016-05-02 VITALS — BP 138/78 | HR 58 | Temp 97.7°F | Resp 20 | Ht 67.0 in | Wt 230.0 lb

## 2016-05-02 DIAGNOSIS — Z0000 Encounter for general adult medical examination without abnormal findings: Secondary | ICD-10-CM

## 2016-05-02 DIAGNOSIS — R0609 Other forms of dyspnea: Secondary | ICD-10-CM

## 2016-05-02 DIAGNOSIS — R0600 Dyspnea, unspecified: Secondary | ICD-10-CM

## 2016-05-02 DIAGNOSIS — J449 Chronic obstructive pulmonary disease, unspecified: Secondary | ICD-10-CM

## 2016-05-02 DIAGNOSIS — Z125 Encounter for screening for malignant neoplasm of prostate: Secondary | ICD-10-CM | POA: Diagnosis not present

## 2016-05-02 NOTE — Progress Notes (Signed)
Patient: Ryan Ali, Male    DOB: May 09, 1940, 76 y.o.   MRN: [id number] Visit Date: 05/02/2016  Today's Provider: [name], MD   Chief Complaint  Patient presents with  . Annual Exam  . COPD  . Hypothyroidism   Subjective:    Annual wellness visit Ryan Ali is a 76 y.o. male. He feels well. He reports exercising occasionally. He reports he is sleeping fairly well.  Colonoscopy- 03/26/2008. Done in [place] per patient.  Tdap- 12/04/2010.  COPD, follow up: Patient was last seen on 05/12/2015. No changes were made in his medications. Patient is currently taking Symbicort and Spiriva and reports that symptoms have been controlled.   Hypothyroidism, follow up: Patient was last seen on 05/12/2015. Since last OV, patient was advised to increase levothyroxine to 166mg daily. TSH was checked in 06/2015 and thyroid levels were normal. Patient has been tolerating med changes well.    Review of Systems  Constitutional: Negative.   HENT: Positive for hearing loss.   Eyes: Negative.   Respiratory: Positive for shortness of breath and wheezing.        Has COPD  Cardiovascular: Negative.   Gastrointestinal: Negative.   Endocrine: Negative.   Genitourinary: Negative.   Musculoskeletal: Positive for arthralgias.  Skin: Negative.   Allergic/Immunologic: Positive for immunocompromised state.       2 bone marrow transplants 9 years ago.   Neurological: Negative.   Hematological: Bruises/bleeds easily.  Psychiatric/Behavioral: Negative.     Social History   Social History  . Marital status: Married    Spouse name: N/A  . Number of children: N/A  . Years of education: 153  Occupational History  . Not on file.   Social History Main Topics  . Smoking status: Former Smoker    Quit date: 03/25/2005  . Smokeless tobacco: Never Used  . Alcohol use No  . Drug use: No  . Sexual activity: Not on file   Other Topics Concern  . Not on file   Social History  Narrative  . No narrative on file    No past medical history on file.   Patient Active Problem List   Diagnosis Date Noted  . Abdominal aneurysm ([facility] 12/16/2014  . Arthritis 12/16/2014  . Barrett's esophagus 12/16/2014  . Bone marrow transplant complication ([facility] 087/68/1157 . Diabetes ([facility] 12/16/2014  . Difficulty staying awake 12/16/2014  . Cervical pain 12/16/2014  . Fatigue 07/28/2009  . Personal history of pulmonary embolism 12/27/2007  . Multiple myeloma in remission ([facility] 03/26/2003  . COPD (chronic obstructive pulmonary disease) ([facility] [place] 03/25/1998  . Acid reflux 03/25/1998  . Hypothyroidism, postop 03/25/1998    No past surgical history on file.  His family history includes Cancer in his father; Diabetes in his brother, brother, brother, and brother; Heart disease in his mother; Stroke in his mother.      Current Outpatient Prescriptions:  .  albuterol (PROVENTIL HFA) 108 (90 BASE) MCG/ACT inhaler, PROVENTIL HFA, 108 (90 Base)MCG/ACT (Inhalation Aerosol Solution)  for 0 days  Quantity: 0.00;  Refills: 0   Ordered :16-Jan-2010  Ryan Ali;  Started 13-Jan-2008 Active, Disp: , Rfl:  .  aspirin 81 MG tablet, Take 1 tablet by mouth daily. Reported on 05/12/2015, Disp: , Rfl:  .  budesonide-formoterol (SYMBICORT) 160-4.5 MCG/ACT inhaler, Inhale 2 puffs into the lungs 2 (two) times daily., Disp: , Rfl:  .  Calcium Carbonate-Vit D-Min (CALCIUM 600+D PLUS MINERALS) 600-400 MG-UNIT CHEW, Chew  3 tablets by mouth daily., Disp: , Rfl:  .  GLUCOSE BLOOD VI, Freestyle test strips. Check blood sugar daily., Disp: , Rfl:  .  HYDROcodone-acetaminophen (VICODIN) 5-500 MG per tablet, Take 2 tablets by mouth Nightly., Disp: , Rfl:  .  levothyroxine (SYNTHROID, LEVOTHROID) 150 MCG tablet, Take 1 tablet (150 mcg total) by mouth daily., Disp: 90 tablet, Rfl: 4 .  meclizine (ANTIVERT) 25 MG tablet, 1/2 to one pill 3 x day as needed for vertigo, Disp: 12 tablet, Rfl: 0 .  Omeprazole 20  MG TBEC, Take 2 tablets by mouth daily., Disp: , Rfl:  .  tiotropium (SPIRIVA HANDIHALER) 18 MCG inhalation capsule, SPIRIVA HANDIHALER, 18MCG (Inhalation Capsule)  for 0 days  Quantity: 0.00;  Refills: 0   Ordered :16-Jan-2010  Edmonia Ali ;  Started 13-Jan-2008 Active, Disp: , Rfl:  .  baclofen (LIORESAL) 10 MG tablet, Take 10 mg by mouth 3 (three) times daily., Disp: , Rfl:   Patient Care Team: Birdie Sons, MD as PCP - General (Family Medicine)     Objective:   Vitals: BP 138/78 (BP Location: Left Arm, Patient Position: Sitting, Cuff Size: Large)   Pulse (!) 58   Temp 97.7 F (36.5 C)   Resp 20   Ht _0  (1.702 m)   Wt 230 lb (104.3 kg)   SpO2 98%   BMI 36.02 kg/m   Physical Exam   General Appearance:    Alert, cooperative, no distress, appears stated age  Head:    Normocephalic, without obvious abnormality, atraumatic  Eyes:    PERRL, conjunctiva/corneas clear, EOM's intact, fundi    benign, both eyes       Ears:    Normal TM's and external ear canals, both ears  Nose:   Nares normal, septum midline, mucosa normal, no drainage   or sinus tenderness  Throat:   Lips, mucosa, and tongue normal; teeth and gums normal  Neck:   Supple, symmetrical, trachea midline, no adenopathy;       thyroid:  No enlargement/tenderness/nodules; no carotid   bruit or JVD  Back:     Symmetric, no curvature, ROM normal, no CVA tenderness  Lungs:     Clear to auscultation bilaterally, respirations unlabored  Chest wall:    No tenderness or deformity  Heart:    Regular rate and rhythm, S1 and S2 normal, no murmur, rub   or gallop  Abdomen:     Soft, non-tender, bowel sounds active all four quadrants,    no masses, no organomegaly  Genitalia:    deferred  Rectal:    deferred  Extremities:   Extremities normal, atraumatic, no cyanosis or edema  Pulses:   2+ and symmetric all extremities  Skin:   Skin color, texture, turgor normal, no rashes or lesions  Lymph nodes:   Cervical,  supraclavicular, and axillary nodes normal  Neurologic:   CNII-XII intact. Normal strength, sensation and reflexes      throughout    Activities of Daily Living In your present state of health, do you have any difficulty performing the following activities: 05/02/2016  Hearing? Y  Vision? N  Difficulty concentrating or making decisions? N  Walking or climbing stairs? N  Dressing or bathing? N  Doing errands, shopping? N  Some recent data might be hidden    Fall Risk Assessment Fall Risk  05/02/2016  Falls in the past year? No     Depression Screen PHQ 2/9 Scores 05/02/2016  PHQ - 2 Score  0    Cognitive Testing - 6-CIT  Correct? Score   What year is it? no 2 0 or 4  What month is it? yes 0 0 or 3  Memorize:    Pia Mau,  41,  [place],      What time is it? (within 1 hour) yes 0 0 or 3  Count backwards from 20 yes 0 0, 2, or 4  Name the months of the year no 4 0, 2, or 4  Repeat name & address above no 4 0, 2, 4, 6, 8, or 10       TOTAL SCORE  10/28   Interpretation:  Abnormal- 10  Normal (0-7) Abnormal (8-28)    Spirometry: Moderate airway obstruction   Assessment & Plan:    Annual Physical Reviewed patient's Family Medical History Reviewed and updated list of patient's medical providers Assessment of cognitive impairment was done Assessed patient's functional ability Established a written schedule for health screening [place] Completed and Reviewed  Exercise Activities and Dietary recommendations Goals    None      Immunization History  Administered Date(s) Administered  . Pneumococcal Polysaccharide-23 01/13/2008  . Tdap 12/04/2010    Health Maintenance  Topic Date Due  . FOOT EXAM  08/07/1949  . OPHTHALMOLOGY EXAM  08/07/1949  . ZOSTAVAX  08/08/1999  . PNA vac Low Risk Adult (2 of 2 - PCV13) 01/12/2009  . INFLUENZA VACCINE  10/24/2015  . HEMOGLOBIN A1C  11/13/2015  . URINE MICROALBUMIN  05/11/2016  .  TETANUS/TDAP  12/03/2020     Discussed health benefits of physical activity, and encouraged him to engage in regular exercise appropriate for his age and condition.    1. Annual physical exam   2. Prostate cancer screening  - PSA  3. Dyspnea on exertion  - Brain natriuretic peptide - CBC - Spirometry with graph; Future - Spirometry with graph  Considering family history of CAD, will consider cardiology referral after reviewing labs .  4. Chronic obstructive pulmonary disease, unspecified COPD type ([place]) Continue current inhalers.  - Spirometry with graph; Future - Spirometry with graph     [name], MD  [facility]

## 2016-05-02 NOTE — Patient Instructions (Signed)
Screening for lung cancer is recommended for people between 55 and 77 years of age who have smoked at 1 pack per day or more for at least 30 years. Please call our office at [phone] to schedule a low dose CT lung scan for lung cancer screening.   

## 2016-05-03 LAB — CBC
HEMATOCRIT: 43.8 % (ref 37.5–51.0)
HEMOGLOBIN: 13 g/dL (ref 13.0–17.7)
MCH: 25.9 pg — AB (ref 26.6–33.0)
MCHC: 29.7 g/dL — ABNORMAL LOW (ref 31.5–35.7)
MCV: 87 fL (ref 79–97)
RBC: 5.01 x10E6/uL (ref 4.14–5.80)
RDW: 16.5 % — ABNORMAL HIGH (ref 12.3–15.4)
WBC: 8.4 10*3/uL (ref 3.4–10.8)

## 2016-05-03 LAB — PSA: PROSTATE SPECIFIC AG, SERUM: 0.4 ng/mL (ref 0.0–4.0)

## 2016-05-03 LAB — BRAIN NATRIURETIC PEPTIDE: BNP: 53.7 pg/mL (ref 0.0–100.0)

## 2016-05-27 DIAGNOSIS — H401131 Primary open-angle glaucoma, bilateral, mild stage: Secondary | ICD-10-CM | POA: Diagnosis not present

## 2016-06-14 DIAGNOSIS — H401131 Primary open-angle glaucoma, bilateral, mild stage: Secondary | ICD-10-CM | POA: Diagnosis not present

## 2016-07-18 ENCOUNTER — Other Ambulatory Visit: Payer: Self-pay | Admitting: Family Medicine

## 2016-07-18 DIAGNOSIS — E039 Hypothyroidism, unspecified: Secondary | ICD-10-CM

## 2016-07-29 ENCOUNTER — Encounter (INDEPENDENT_AMBULATORY_CARE_PROVIDER_SITE_OTHER): Payer: Self-pay | Admitting: Vascular Surgery

## 2016-07-29 ENCOUNTER — Ambulatory Visit (INDEPENDENT_AMBULATORY_CARE_PROVIDER_SITE_OTHER): Payer: PPO | Admitting: Vascular Surgery

## 2016-07-29 VITALS — BP 137/78 | HR 79 | Resp 16 | Ht 66.0 in | Wt 224.8 lb

## 2016-07-29 DIAGNOSIS — I714 Abdominal aortic aneurysm, without rupture, unspecified: Secondary | ICD-10-CM

## 2016-07-29 DIAGNOSIS — E119 Type 2 diabetes mellitus without complications: Secondary | ICD-10-CM | POA: Diagnosis not present

## 2016-07-29 DIAGNOSIS — J449 Chronic obstructive pulmonary disease, unspecified: Secondary | ICD-10-CM

## 2016-07-29 DIAGNOSIS — K2100 Gastro-esophageal reflux disease with esophagitis, without bleeding: Secondary | ICD-10-CM

## 2016-07-29 NOTE — Progress Notes (Signed)
MRN : [id number]  Ryan Ali is a 76 y.o. (02-Jun-1940) male who presents with chief complaint of  Chief Complaint  Patient presents with  . Follow-up  .  History of Present Illness: The patient returns to the office for surveillance of a known abdominal aortic aneurysm. Patient denies abdominal pain or back pain, no other abdominal complaints. No changes suggesting embolic episodes.   There have been no interval changes in the patient's overall health care since his last visit.  Patient denies amaurosis fugax or TIA symptoms. There is no history of claudication or rest pain symptoms of the lower extremities. The patient denies angina or shortness of breath.   Duplex US of the aorta and iliac arteries shows an AAA measured 4.2 cm aneurysm.  Current Meds  Medication Sig  . albuterol (PROVENTIL HFA) 108 (90 BASE) MCG/ACT inhaler PROVENTIL HFA, 108 (90 Base)MCG/ACT (Inhalation Aerosol Solution)  for 0 days  Quantity: 0.00;  Refills: 0   Ordered :16-Jan-2010  [name] ;  Started 13-Jan-2008 Active  . aspirin 81 MG tablet Take 1 tablet by mouth daily. Reported on 05/12/2015  . baclofen (LIORESAL) 10 MG tablet Take 10 mg by mouth 3 (three) times daily.  . budesonide-formoterol (SYMBICORT) 160-4.5 MCG/ACT inhaler Inhale 2 puffs into the lungs 2 (two) times daily.  . Calcium Carbonate-Vit D-Min (CALCIUM 600+D PLUS MINERALS) 600-400 MG-UNIT CHEW Chew 3 tablets by mouth daily.  Marland Kitchen GLUCOSE BLOOD VI Freestyle test strips. Check blood sugar daily.  Marland Kitchen HYDROcodone-acetaminophen (VICODIN) 5-500 MG per tablet Take 2 tablets by mouth Nightly.  . levothyroxine (SYNTHROID, LEVOTHROID) 150 MCG tablet TAKE 1 TABLET(150 MCG) BY MOUTH DAILY  . Omeprazole 20 MG TBEC Take 2 tablets by mouth daily.  Marland Kitchen tiotropium (SPIRIVA HANDIHALER) 18 MCG inhalation capsule SPIRIVA HANDIHALER, 18MCG (Inhalation Capsule)  for 0 days  Quantity: 0.00;  Refills: 0   Ordered :16-Jan-2010  [name] ;  Started 13-Jan-2008 Active    Past Medical History:  Diagnosis Date  . AAA (abdominal aortic aneurysm) ([place])   . COPD (chronic obstructive pulmonary disease) ([place])   . Multiple myeloma [facility])     Past Surgical History:  Procedure Laterality Date  . BONE MARROW TRANSPLANT    . HERNIA REPAIR    . THYROIDECTOMY      Social History Social History  Substance Use Topics  . Smoking status: Former Smoker    Quit date: 03/25/2005  . Smokeless tobacco: Never Used  . Alcohol use No    Family History Family History  Problem Relation Age of Onset  . Heart disease Mother   . Stroke Mother   . Cancer Father     Kidney cancer  . Diabetes Brother   . Diabetes Brother   . Diabetes Brother   . Diabetes Brother     No Known Allergies   REVIEW OF SYSTEMS (Negative unless checked)  Constitutional: [] Weight loss  [] Fever  [] Chills Cardiac: [] Chest pain   [] Chest pressure   [] Palpitations   [] Shortness of breath when laying flat   [x] Shortness of breath with exertion. Vascular:  [] Pain in legs with walking   [] Pain in legs at rest  [] History of DVT   [] Phlebitis   [x] Swelling in legs   [] Varicose veins   [] Non-healing ulcers Pulmonary:   [] Uses home oxygen   [] Productive cough   [] Hemoptysis   [] Wheeze  [x] COPD   [] Asthma Neurologic:  [] Dizziness   [] Seizures   [] History of stroke   [] History of  TIA  [] Aphasia   [] Vissual changes   [] Weakness or numbness in arm   [] Weakness or numbness in leg Musculoskeletal:   [] Joint swelling   [] Joint pain   [] Low back pain Hematologic:  [] Easy bruising  [] Easy bleeding   [] Hypercoagulable state   [] Anemic Gastrointestinal:  [] Diarrhea   [] Vomiting  [] Gastroesophageal reflux/heartburn   [] Difficulty swallowing. Genitourinary:  [] Chronic kidney disease   [] Difficult urination  [] Frequent urination   [] Blood in urine Skin:  [] Rashes   [] Ulcers  Psychological:  [] History of anxiety   []  History of major depression.  Physical  Examination  Vitals:   07/29/16 1537  BP: 137/78  Pulse: 79  Resp: 16  Weight: 224 lb 12.8 oz (102 kg)  Height: 5' 6"  (1.676 m)   Body mass index is 36.28 kg/m. Gen: WD/WN, NAD Head: [place]/AT, No temporalis wasting.  Ear/Nose/Throat: Hearing grossly intact, nares w/o erythema or drainage Eyes: PER, EOMI, sclera nonicteric.  Neck: Supple, no large masses.   Pulmonary:  Good air movement, no audible wheezing bilaterally, no use of accessory muscles.  Cardiac: RRR, no JVD Vascular:  Vessel Right Left  Radial Palpable Palpable  Ulnar Palpable Palpable  Brachial Palpable Palpable  Carotid Palpable Palpable  Femoral Palpable Palpable  Popliteal Palpable Palpable  PT Palpable Palpable  DP Palpable Palpable  Gastrointestinal: Non-distended. No guarding/no peritoneal signs.  Musculoskeletal: M/S 5/5 throughout.  No deformity or atrophy.  Neurologic: CN 2-12 intact. Symmetrical.  Speech is fluent. Motor exam as listed above. Psychiatric: Judgment intact, Mood & affect appropriate for pt's clinical situation. Dermatologic: No rashes or ulcers noted.  No changes consistent with cellulitis. Lymph : No lichenification or skin changes of chronic lymphedema.  CBC Lab Results  Component Value Date   WBC 8.4 05/02/2016   HGB 14.8 03/29/2014   HCT 43.8 05/02/2016   MCV 87 05/02/2016   PLT CANCELED 05/02/2016    BMET    Component Value Date/Time   NA 142 02/19/2016 1505   NA 140 11/29/2011 0851   K 4.5 02/19/2016 1505   K 4.2 11/29/2011 0851   CL 103 02/19/2016 1505   CL 105 11/29/2011 0851   CO2 24 02/19/2016 1505   CO2 30 11/29/2011 0851   GLUCOSE 111 (H) 02/19/2016 1505   GLUCOSE 115 (H) 11/29/2011 0851   BUN 17 02/19/2016 1505   BUN 17 11/29/2011 0851   CREATININE 1.26 02/19/2016 1505   CREATININE 1.29 11/29/2011 0851   CALCIUM 8.9 02/19/2016 1505   CALCIUM 8.6 11/29/2011 0851   GFRNONAA 55 (L) 02/19/2016 1505   GFRNONAA 55 (L) 11/29/2011 0851   GFRAA 64 02/19/2016  1505   GFRAA >60 11/29/2011 0851   CrCl cannot be calculated (Patient's most recent lab result is older than the maximum 21 days allowed.).  COAG No results found for: INR, PROTIME  Radiology No results found.  Assessment/Plan 1. Abdominal aneurysm ([place]) No surgery or intervention at this time. The patient has an asymptomatic abdominal aortic aneurysm that is greater than 4 cm but less than 5 cm in maximal diameter.  I have discussed the natural history of abdominal aortic aneurysm and the small risk of rupture for aneurysm less than 5 cm in size.  However, as these small aneurysms tend to enlarge over time, continued surveillance with ultrasound or CT scan is mandatory.  I have also discussed optimizing medical management with hypertension and lipid control and the importance of abstinence from tobacco.  The patient is also encouraged to exercise a  minimum of 30 minutes 4 times a week.  Should the patient develop new onset abdominal or back pain or signs of peripheral embolization they are instructed to seek medical attention immediately and to alert the physician providing care that they have an aneurysm.  The patient voices their understanding. I have scheduled the patient to return in 6 months with an aortic duplex. - VAS US AORTA/IVC/ILIACS; Future  2. Chronic obstructive pulmonary disease, unspecified COPD type ([place]) Continue pulmonary medications and aerosols as already ordered, these medications have been reviewed and there are no changes at this time.    3. Type 2 diabetes mellitus without complication, without long-term current use of insulin (HCC) Continue hypoglycemic medications as already ordered, these medications have been reviewed and there are no changes at this time.  Hgb A1C to be monitored as already arranged by primary service   4. Gastroesophageal reflux disease with esophagitis Continue PPI as ordered and reviewed, no changes at this time     [name], MD  07/29/2016 4:44 PM

## 2016-08-05 DIAGNOSIS — H903 Sensorineural hearing loss, bilateral: Secondary | ICD-10-CM | POA: Diagnosis not present

## 2016-08-05 DIAGNOSIS — H7012 Chronic mastoiditis, left ear: Secondary | ICD-10-CM | POA: Diagnosis not present

## 2016-08-14 ENCOUNTER — Encounter (INDEPENDENT_AMBULATORY_CARE_PROVIDER_SITE_OTHER): Payer: Self-pay

## 2016-08-20 ENCOUNTER — Encounter: Payer: Self-pay | Admitting: Family Medicine

## 2016-08-20 ENCOUNTER — Ambulatory Visit (INDEPENDENT_AMBULATORY_CARE_PROVIDER_SITE_OTHER): Payer: PPO | Admitting: Family Medicine

## 2016-08-20 VITALS — BP 130/64 | HR 93 | Temp 98.4°F | Resp 18 | Wt 226.0 lb

## 2016-08-20 DIAGNOSIS — R05 Cough: Secondary | ICD-10-CM | POA: Diagnosis not present

## 2016-08-20 DIAGNOSIS — J40 Bronchitis, not specified as acute or chronic: Secondary | ICD-10-CM

## 2016-08-20 DIAGNOSIS — R059 Cough, unspecified: Secondary | ICD-10-CM

## 2016-08-20 MED ORDER — PREDNISONE 20 MG PO TABS: 20.0000 mg | ORAL_TABLET | Freq: Two times a day (BID) | ORAL | 0 refills | Status: AC

## 2016-08-20 MED ORDER — LEVOFLOXACIN 750 MG PO TABS: 750.0000 mg | ORAL_TABLET | Freq: Every day | ORAL | 0 refills | Status: AC

## 2016-08-20 NOTE — Progress Notes (Signed)
Patient: Ryan Ali Male    DOB: 15-Nov-1940   76 y.o.   MRN: [id number] Visit Date: 08/20/2016  Today's Provider: [name], MD   Chief Complaint  Patient presents with  . Cough   Subjective:    Patient has had a cough for 1 week. Patient has symptoms of cough, sob, wheezing, some ear pain, and chest pain. Cough is productive. Patient has been using his prescribed inhalers and otc cough medication with no relief.    Cough  This is a new problem. The current episode started in the past 7 days (1 week). The problem has been gradually worsening. The problem occurs every few minutes. The cough is productive of sputum. Associated symptoms include chest pain, chills, ear congestion, ear pain, headaches, shortness of breath and wheezing. Pertinent negatives include no fever, heartburn, hemoptysis, myalgias, nasal congestion, postnasal drip, rash, rhinorrhea, sore throat, sweats or weight loss. The symptoms are aggravated by lying down and exercise. He has tried steroid inhaler (otc cough medication) for the symptoms. The treatment provided no relief. His past medical history is significant for bronchitis, COPD and pneumonia.       No Known Allergies   Current Outpatient Prescriptions:  .  albuterol (PROVENTIL HFA) 108 (90 BASE) MCG/ACT inhaler, PROVENTIL HFA, 108 (90 Base)MCG/ACT (Inhalation Aerosol Solution)  for 0 days  Quantity: 0.00;  Refills: 0   Ordered :16-Jan-2010  [name] ;  Started 13-Jan-2008 Active, Disp: , Rfl:  .  aspirin 81 MG tablet, Take 1 tablet by mouth daily. Reported on 05/12/2015, Disp: , Rfl:  .  baclofen (LIORESAL) 10 MG tablet, Take 10 mg by mouth 3 (three) times daily., Disp: , Rfl:  .  budesonide-formoterol (SYMBICORT) 160-4.5 MCG/ACT inhaler, Inhale 2 puffs into the lungs 2 (two) times daily., Disp: , Rfl:  .  Calcium Carbonate-Vit D-Min (CALCIUM 600+D PLUS MINERALS) 600-400 MG-UNIT CHEW, Chew 3 tablets by mouth daily., Disp: , Rfl:    .  Cholecalciferol (VITAMIN D PO), Take by mouth., Disp: , Rfl:  .  GLUCOSE BLOOD VI, Freestyle test strips. Check blood sugar daily., Disp: , Rfl:  .  HYDROcodone-acetaminophen (VICODIN) 5-500 MG per tablet, Take 2 tablets by mouth Nightly., Disp: , Rfl:  .  levothyroxine (SYNTHROID, LEVOTHROID) 150 MCG tablet, TAKE 1 TABLET(150 MCG) BY MOUTH DAILY, Disp: 90 tablet, Rfl: 4 .  meclizine (ANTIVERT) 25 MG tablet, 1/2 to one pill 3 x day as needed for vertigo, Disp: 12 tablet, Rfl: 0 .  Omeprazole 20 MG TBEC, Take 2 tablets by mouth daily., Disp: , Rfl:  .  tiotropium (SPIRIVA HANDIHALER) 18 MCG inhalation capsule, SPIRIVA HANDIHALER, 18MCG (Inhalation Capsule)  for 0 days  Quantity: 0.00;  Refills: 0   Ordered :16-Jan-2010  [name] ;  Started 13-Jan-2008 Active, Disp: , Rfl:   Review of Systems  Constitutional: Positive for chills. Negative for appetite change, fever and weight loss.  HENT: Positive for congestion and ear pain. Negative for postnasal drip, rhinorrhea and sore throat.   Respiratory: Positive for cough, shortness of breath and wheezing. Negative for hemoptysis and chest tightness.   Cardiovascular: Positive for chest pain. Negative for palpitations.  Gastrointestinal: Negative for abdominal pain, heartburn, nausea and vomiting.  Musculoskeletal: Negative for myalgias.  Skin: Negative for rash.  Neurological: Positive for headaches.    Social History  Substance Use Topics  . Smoking status: Former Smoker    Quit date: 03/25/2005  . Smokeless tobacco: Never Used  .  Alcohol use No   Objective:   BP 130/64 (BP Location: Right Arm, Patient Position: Sitting, Cuff Size: Large)   Pulse 93   Temp 98.4 F (36.9 C) (Oral)   Resp 18   Wt 226 lb (102.5 kg)   SpO2 (!) 89%   BMI 36.48 kg/m  Vitals:   08/20/16 1415  BP: 130/64  Pulse: 93  Resp: 18  Temp: 98.4 F (36.9 C)  TempSrc: Oral  SpO2: (!) 89%  Weight: 226 lb (102.5 kg)     Physical Exam   General  Appearance:    Alert, cooperative, no distress  Eyes:    PERRL, conjunctiva/corneas clear, EOM's intact       Lungs:     Occasional expiratory wheeze, no rales, respirations unlabored  Heart:    Regular rate and rhythm  Neurologic:   Awake, alert, oriented x 3. No apparent focal neurological           defect.           Assessment & Plan:     1. Bronchitis  - levofloxacin (LEVAQUIN) 750 MG tablet; Take 1 tablet (750 mg total) by mouth daily.  Dispense: 10 tablet; Refill: 0 - predniSONE (DELTASONE) 20 MG tablet; Take 1 tablet (20 mg total) by mouth 2 (two) times daily with a meal.  Dispense: 10 tablet; Refill: 0  2. Cough   Call if symptoms change or if not rapidly improving.          [name], MD  [facility]

## 2016-08-20 NOTE — Patient Instructions (Signed)

## 2016-10-23 ENCOUNTER — Telehealth: Payer: Self-pay | Admitting: Family Medicine

## 2016-10-23 NOTE — Telephone Encounter (Signed)
Left message about need to schedule AWV-ab °

## 2016-12-04 ENCOUNTER — Telehealth: Payer: Self-pay | Admitting: Family Medicine

## 2016-12-10 ENCOUNTER — Other Ambulatory Visit: Payer: Self-pay

## 2016-12-10 DIAGNOSIS — Z1211 Encounter for screening for malignant neoplasm of colon: Secondary | ICD-10-CM

## 2016-12-10 DIAGNOSIS — Z1212 Encounter for screening for malignant neoplasm of rectum: Principal | ICD-10-CM

## 2017-01-02 DIAGNOSIS — H401131 Primary open-angle glaucoma, bilateral, mild stage: Secondary | ICD-10-CM | POA: Diagnosis not present

## 2017-01-09 ENCOUNTER — Ambulatory Visit: Payer: Non-veteran care | Admitting: Certified Registered"

## 2017-01-09 ENCOUNTER — Encounter
Admission: RE | Disposition: A | Discharge status: Home / Self Care | Payer: Self-pay | Source: Ambulatory Visit | Attending: Gastroenterology

## 2017-01-09 ENCOUNTER — Ambulatory Visit
Admission: RE | Admit: 2017-01-09 | Discharge: 2017-01-09 | Disposition: A | Discharge status: Home / Self Care | Payer: Non-veteran care | Source: Ambulatory Visit | Attending: Gastroenterology | Admitting: Gastroenterology

## 2017-01-09 DIAGNOSIS — Z79891 Long term (current) use of opiate analgesic: Secondary | ICD-10-CM | POA: Insufficient documentation

## 2017-01-09 DIAGNOSIS — Z86010 Personal history of colonic polyps: Secondary | ICD-10-CM | POA: Diagnosis not present

## 2017-01-09 DIAGNOSIS — I714 Abdominal aortic aneurysm, without rupture: Secondary | ICD-10-CM | POA: Diagnosis not present

## 2017-01-09 DIAGNOSIS — E1151 Type 2 diabetes mellitus with diabetic peripheral angiopathy without gangrene: Secondary | ICD-10-CM | POA: Insufficient documentation

## 2017-01-09 DIAGNOSIS — Z7951 Long term (current) use of inhaled steroids: Secondary | ICD-10-CM | POA: Diagnosis not present

## 2017-01-09 DIAGNOSIS — Z79899 Other long term (current) drug therapy: Secondary | ICD-10-CM | POA: Diagnosis not present

## 2017-01-09 DIAGNOSIS — M1990 Unspecified osteoarthritis, unspecified site: Secondary | ICD-10-CM | POA: Diagnosis not present

## 2017-01-09 DIAGNOSIS — K640 First degree hemorrhoids: Secondary | ICD-10-CM

## 2017-01-09 DIAGNOSIS — Z1212 Encounter for screening for malignant neoplasm of rectum: Secondary | ICD-10-CM

## 2017-01-09 DIAGNOSIS — E039 Hypothyroidism, unspecified: Secondary | ICD-10-CM | POA: Diagnosis not present

## 2017-01-09 DIAGNOSIS — K5730 Diverticulosis of large intestine without perforation or abscess without bleeding: Secondary | ICD-10-CM | POA: Diagnosis not present

## 2017-01-09 DIAGNOSIS — Z87891 Personal history of nicotine dependence: Secondary | ICD-10-CM | POA: Insufficient documentation

## 2017-01-09 DIAGNOSIS — Z1211 Encounter for screening for malignant neoplasm of colon: Secondary | ICD-10-CM | POA: Insufficient documentation

## 2017-01-09 DIAGNOSIS — E119 Type 2 diabetes mellitus without complications: Secondary | ICD-10-CM | POA: Diagnosis not present

## 2017-01-09 DIAGNOSIS — Z8579 Personal history of other malignant neoplasms of lymphoid, hematopoietic and related tissues: Secondary | ICD-10-CM | POA: Diagnosis not present

## 2017-01-09 DIAGNOSIS — Z7982 Long term (current) use of aspirin: Secondary | ICD-10-CM | POA: Insufficient documentation

## 2017-01-09 DIAGNOSIS — J449 Chronic obstructive pulmonary disease, unspecified: Secondary | ICD-10-CM | POA: Diagnosis not present

## 2017-01-09 DIAGNOSIS — K219 Gastro-esophageal reflux disease without esophagitis: Secondary | ICD-10-CM | POA: Diagnosis not present

## 2017-01-09 DIAGNOSIS — D123 Benign neoplasm of transverse colon: Secondary | ICD-10-CM | POA: Diagnosis not present

## 2017-01-09 DIAGNOSIS — K635 Polyp of colon: Secondary | ICD-10-CM | POA: Diagnosis not present

## 2017-01-09 DIAGNOSIS — E890 Postprocedural hypothyroidism: Secondary | ICD-10-CM | POA: Insufficient documentation

## 2017-01-09 DIAGNOSIS — K5790 Diverticulosis of intestine, part unspecified, without perforation or abscess without bleeding: Secondary | ICD-10-CM | POA: Diagnosis not present

## 2017-01-09 DIAGNOSIS — K648 Other hemorrhoids: Secondary | ICD-10-CM | POA: Diagnosis not present

## 2017-01-09 HISTORY — PX: COLONOSCOPY WITH PROPOFOL: SHX5780

## 2017-01-09 HISTORY — DX: Unspecified firearm discharge, undetermined intent, initial encounter: Y24.9XXA

## 2017-01-09 HISTORY — DX: Accidental discharge from unspecified firearms or gun, initial encounter: W34.00XA

## 2017-01-09 HISTORY — DX: Hypothyroidism, unspecified: E03.9

## 2017-01-09 SURGERY — COLONOSCOPY WITH PROPOFOL
Anesthesia: General

## 2017-01-09 MED ORDER — PROPOFOL 10 MG/ML IV BOLUS
INTRAVENOUS | Status: DC | PRN
  Administered 2017-01-09: 10 mg via INTRAVENOUS
  Administered 2017-01-09: 50 mg via INTRAVENOUS

## 2017-01-09 MED ORDER — PROPOFOL 500 MG/50ML IV EMUL
INTRAVENOUS | Status: DC | PRN
  Administered 2017-01-09: 125 ug/kg/min via INTRAVENOUS

## 2017-01-09 MED ORDER — LIDOCAINE HCL (CARDIAC) 20 MG/ML IV SOLN
INTRAVENOUS | Status: DC | PRN
  Administered 2017-01-09: 50 mg via INTRAVENOUS

## 2017-01-09 MED ORDER — SODIUM CHLORIDE 0.9 % IV SOLN
INTRAVENOUS | Status: DC
  Administered 2017-01-09: 08:00:00 via INTRAVENOUS

## 2017-01-09 MED ORDER — PHENYLEPHRINE HCL 10 MG/ML IJ SOLN
INTRAMUSCULAR | Status: DC | PRN
  Administered 2017-01-09: 100 ug via INTRAVENOUS

## 2017-01-09 MED ORDER — PROPOFOL 500 MG/50ML IV EMUL
INTRAVENOUS | Status: AC
  Filled 2017-01-09: qty 50

## 2017-01-09 NOTE — Op Note (Signed)
[facility] Gastroenterology Patient Name: Ryan Ali Procedure Date: 01/09/2017 8:11 AM MRN: [id number] Account #: [id number] Date of Birth: Jun 08, 1940 Admit Type: Outpatient Age: 76 Room: [facility] ENDO ROOM 3 Gender: Male Note Status: Finalized Procedure:            Colonoscopy Indications:          High risk colon cancer surveillance: Personal history                        of colonic polyps Providers:            Jonathon Bellows MD, MD Referring MD:         Kirstie Peri. Caryn Section, MD (Referring MD) Medicines:            Monitored Anesthesia Care Complications:        No immediate complications. Procedure:            Pre-Anesthesia Assessment:                       - Prior to the procedure, a History and Physical was                        performed, and patient medications, allergies and                        sensitivities were reviewed. The patient's tolerance of                        previous anesthesia was reviewed.                       - The risks and benefits of the procedure and the                        sedation options and risks were discussed with the                        patient. All questions were answered and informed                        consent was obtained.                       - ASA Grade Assessment: III - A patient with severe                        systemic disease.                       After obtaining informed consent, the colonoscope was                        passed under direct vision. Throughout the procedure,                        the patient's blood pressure, pulse, and oxygen                        saturations were monitored continuously. The  Colonoscope was introduced through the anus and                        advanced to the the cecum, identified by the                        appendiceal orifice, IC valve and transillumination.                        The colonoscopy was performed with ease. The patient                         tolerated the procedure well. The quality of the bowel                        preparation was good. Findings:      The perianal and digital rectal examinations were normal.      A 3 mm polyp was found in the transverse colon. The polyp was sessile.       The polyp was removed with a cold biopsy forceps. Resection and       retrieval were complete.      Internal hemorrhoids were found during retroflexion. The hemorrhoids       were medium-sized and Grade I (internal hemorrhoids that do not       prolapse).      Multiple medium-mouthed diverticula were found in the sigmoid colon.      The exam was otherwise without abnormality on direct and retroflexion       views. Impression:           - One 3 mm polyp in the transverse colon, removed with                        a cold biopsy forceps. Resected and retrieved.                       - Internal hemorrhoids.                       - Diverticulosis in the sigmoid colon.                       - The examination was otherwise normal on direct and                        retroflexion views. Recommendation:       - Discharge patient to home (with escort).                       - Resume previous diet.                       - Continue present medications.                       - Await pathology results.                       - Would not recommend a repeat colonopscopy due to age                        for surveillance purposes Procedure  Code(s):    --- Professional ---                       [phone], Colonoscopy, flexible; with biopsy, single or                        multiple Diagnosis Code(s):    --- Professional ---                       Z86.010, Personal history of colonic polyps                       D12.3, Benign neoplasm of transverse colon (hepatic                        flexure or splenic flexure)                       K64.0, First degree hemorrhoids                       K57.30, Diverticulosis of large intestine without                         perforation or abscess without bleeding CPT copyright 2016 American Medical Association. All rights reserved. The codes documented in this report are preliminary and upon coder review may  be revised to meet current compliance requirements. Jonathon Bellows, MD Jonathon Bellows MD, MD 01/09/2017 8:37:36 AM This report has been signed electronically. Number of Addenda: 0 Note Initiated On: 01/09/2017 8:11 AM Scope Withdrawal Time: 0 hours 9 minutes 46 seconds  Total Procedure Duration: 0 hours 15 minutes 19 seconds       [facility]

## 2017-01-09 NOTE — Anesthesia Procedure Notes (Signed)
Performed by: Dlynn Ranes Pre-anesthesia Checklist: Patient identified, Emergency Drugs available, Suction available, Patient being monitored and Timeout performed Patient Re-evaluated:Patient Re-evaluated prior to induction Oxygen Delivery Method: Nasal cannula Induction Type: IV induction       

## 2017-01-09 NOTE — Anesthesia Post-op Follow-up Note (Signed)
Anesthesia QCDR form completed.        
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
## 2017-01-09 NOTE — Transfer of Care (Signed)
Immediate Anesthesia Transfer of Care Note  Patient: Ryan Ali  Procedure(s) Performed: COLONOSCOPY WITH PROPOFOL (N/A )  Patient Location: PACU  Anesthesia Type:General  Level of Consciousness: awake and responds to stimulation  Airway & Oxygen Therapy: Patient Spontanous Breathing and Patient connected to nasal cannula oxygen  Post-op Assessment: Report given to RN and Post -op Vital signs reviewed and stable  Post vital signs: Reviewed and stable  Last Vitals:  Vitals:   01/09/17 0838 01/09/17 0842  BP: 106/62 106/62  Pulse: 71   Resp: 18   Temp: 36.6 C   SpO2: 100% 100%    Last Pain:  Vitals:   01/09/17 0838  TempSrc: Tympanic         Complications: No apparent anesthesia complications
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
## 2017-01-09 NOTE — Anesthesia Postprocedure Evaluation (Signed)
Anesthesia Post Note  Patient: Ryan Ali  Procedure(s) Performed: COLONOSCOPY WITH PROPOFOL (N/A )  Patient location during evaluation: Endoscopy Anesthesia Type: General Level of consciousness: awake and alert Pain management: pain level controlled Vital Signs Assessment: post-procedure vital signs reviewed and stable Respiratory status: spontaneous breathing, nonlabored ventilation, respiratory function stable and patient connected to nasal cannula oxygen Cardiovascular status: blood pressure returned to baseline and stable Postop Assessment: no apparent nausea or vomiting Anesthetic complications: no     Last Vitals:  Vitals:   01/09/17 0908 01/09/17 0918  BP: 134/81 132/81  Pulse: 67 68  Resp: 18 18  Temp:    SpO2:      Last Pain:  Vitals:   01/09/17 0838  TempSrc: Tympanic                 Precious Haws Piscitello

## 2017-01-10 LAB — SURGICAL PATHOLOGY

## 2017-01-13 ENCOUNTER — Encounter: Payer: Self-pay | Admitting: Family Medicine

## 2017-01-13 DIAGNOSIS — Z860101 Personal history of adenomatous and serrated colon polyps: Secondary | ICD-10-CM | POA: Insufficient documentation

## 2017-01-13 DIAGNOSIS — Z86010 Personal history of colonic polyps: Secondary | ICD-10-CM | POA: Insufficient documentation

## 2017-01-14 ENCOUNTER — Encounter: Payer: Self-pay | Admitting: Gastroenterology

## 2017-01-28 DIAGNOSIS — H903 Sensorineural hearing loss, bilateral: Secondary | ICD-10-CM | POA: Diagnosis not present

## 2017-01-28 DIAGNOSIS — H7012 Chronic mastoiditis, left ear: Secondary | ICD-10-CM | POA: Diagnosis not present

## 2017-01-30 ENCOUNTER — Ambulatory Visit (INDEPENDENT_AMBULATORY_CARE_PROVIDER_SITE_OTHER): Payer: PPO | Admitting: Vascular Surgery

## 2017-01-30 ENCOUNTER — Other Ambulatory Visit (INDEPENDENT_AMBULATORY_CARE_PROVIDER_SITE_OTHER): Payer: PPO

## 2017-02-14 ENCOUNTER — Telehealth: Payer: Self-pay

## 2017-02-14 MED ORDER — LEVOFLOXACIN 750 MG PO TABS: 750.0000 mg | ORAL_TABLET | Freq: Every day | ORAL | 0 refills | Status: AC

## 2017-02-14 MED ORDER — PREDNISONE 10 MG PO TABS: ORAL_TABLET | ORAL | 0 refills | Status: AC

## 2017-02-14 NOTE — Telephone Encounter (Signed)
Patient's wife is calling saying that the patient has had a severe cough and congestion since Monday (about 4 days). She reports that he has felt feverish on the first day, but denies any fever or chills currently. She reports that the patient gets Bronchitis frequently and you normally prescribe an abx with prednisone. She was requesting for him to be seen today, but we currently do not have any appts due to being open half a day. She reports that the patient has a hard time getting a deep breath due to coughing. Per the patient's wife, he his cough is worse more at night. Would you be willing to send in medication for the patient? The patient uses [facility] in [place] as his pharmacy. Please advise. Thanks!

## 2017-02-14 NOTE — Telephone Encounter (Signed)
Advised patient's wife as below.  

## 2017-02-14 NOTE — Telephone Encounter (Signed)
Have sent prescription levoquin and prednisone to [facility]. Needs to go to ER if fever over 101, shortness of breath or chest pains.

## 2017-02-27 ENCOUNTER — Encounter (INDEPENDENT_AMBULATORY_CARE_PROVIDER_SITE_OTHER): Payer: Self-pay | Admitting: Vascular Surgery

## 2017-02-27 ENCOUNTER — Ambulatory Visit (INDEPENDENT_AMBULATORY_CARE_PROVIDER_SITE_OTHER): Payer: PPO | Admitting: Vascular Surgery

## 2017-02-27 ENCOUNTER — Ambulatory Visit (INDEPENDENT_AMBULATORY_CARE_PROVIDER_SITE_OTHER): Payer: PPO

## 2017-02-27 VITALS — BP 150/87 | HR 69 | Resp 18 | Ht 66.0 in | Wt 232.0 lb

## 2017-02-27 DIAGNOSIS — E119 Type 2 diabetes mellitus without complications: Secondary | ICD-10-CM

## 2017-02-27 DIAGNOSIS — I714 Abdominal aortic aneurysm, without rupture, unspecified: Secondary | ICD-10-CM

## 2017-02-27 DIAGNOSIS — J449 Chronic obstructive pulmonary disease, unspecified: Secondary | ICD-10-CM
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
## 2017-03-04 ENCOUNTER — Encounter (INDEPENDENT_AMBULATORY_CARE_PROVIDER_SITE_OTHER): Payer: Self-pay

## 2017-04-10 ENCOUNTER — Telehealth: Payer: Self-pay | Admitting: Family Medicine

## 2017-04-10 NOTE — Telephone Encounter (Signed)
No appt scheduled to date

## 2017-06-26 NOTE — Telephone Encounter (Signed)
complete

## 2017-07-21 ENCOUNTER — Other Ambulatory Visit: Payer: Self-pay | Admitting: Family Medicine

## 2017-07-21 DIAGNOSIS — E039 Hypothyroidism, unspecified: Secondary | ICD-10-CM

## 2017-08-28 ENCOUNTER — Ambulatory Visit (INDEPENDENT_AMBULATORY_CARE_PROVIDER_SITE_OTHER): Payer: PPO | Admitting: Vascular Surgery

## 2017-08-28 ENCOUNTER — Encounter (INDEPENDENT_AMBULATORY_CARE_PROVIDER_SITE_OTHER): Payer: PPO

## 2017-11-07 DIAGNOSIS — H401111 Primary open-angle glaucoma, right eye, mild stage: Secondary | ICD-10-CM | POA: Diagnosis not present

## 2017-12-11 ENCOUNTER — Ambulatory Visit (INDEPENDENT_AMBULATORY_CARE_PROVIDER_SITE_OTHER): Payer: PPO

## 2017-12-11 ENCOUNTER — Ambulatory Visit (INDEPENDENT_AMBULATORY_CARE_PROVIDER_SITE_OTHER): Payer: PPO | Admitting: Vascular Surgery

## 2017-12-11 ENCOUNTER — Encounter (INDEPENDENT_AMBULATORY_CARE_PROVIDER_SITE_OTHER): Payer: Self-pay | Admitting: Vascular Surgery

## 2017-12-11 ENCOUNTER — Encounter (INDEPENDENT_AMBULATORY_CARE_PROVIDER_SITE_OTHER): Payer: Self-pay

## 2017-12-11 ENCOUNTER — Other Ambulatory Visit (INDEPENDENT_AMBULATORY_CARE_PROVIDER_SITE_OTHER): Payer: Self-pay | Admitting: Vascular Surgery

## 2017-12-11 ENCOUNTER — Encounter

## 2017-12-11 VITALS — BP 182/89 | HR 66 | Resp 16 | Ht 66.0 in | Wt 234.0 lb

## 2017-12-11 DIAGNOSIS — I714 Abdominal aortic aneurysm, without rupture, unspecified: Secondary | ICD-10-CM

## 2017-12-11 DIAGNOSIS — E119 Type 2 diabetes mellitus without complications: Secondary | ICD-10-CM
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
## 2017-12-12 ENCOUNTER — Telehealth (INDEPENDENT_AMBULATORY_CARE_PROVIDER_SITE_OTHER): Payer: Self-pay | Admitting: Vascular Surgery

## 2017-12-12 NOTE — Telephone Encounter (Signed)
I called the patient to inform him and his wife that after speaking with Dr. Delana Meyer a yearly AAA duplex follow up is appropriate. I called the patients mobile phone and was unable to leave a message as the voicemail was not set up. I then tried the home phone. The patients wife answered and stated she "doesn't answer this phone" and hung up before I was able to relay the information. I will ask the office to send a letter to inform them.

## 2018-02-25 ENCOUNTER — Encounter: Payer: Self-pay | Admitting: Family Medicine

## 2018-02-25 ENCOUNTER — Ambulatory Visit
Admission: RE | Admit: 2018-02-25 | Discharge: 2018-02-25 | Disposition: A | Discharge status: Home / Self Care | Payer: PPO | Source: Ambulatory Visit | Attending: Family Medicine | Admitting: Family Medicine

## 2018-02-25 ENCOUNTER — Ambulatory Visit (INDEPENDENT_AMBULATORY_CARE_PROVIDER_SITE_OTHER): Payer: PPO | Admitting: Family Medicine

## 2018-02-25 VITALS — BP 118/62 | HR 64 | Temp 97.8°F | Resp 16 | Wt 238.0 lb

## 2018-02-25 DIAGNOSIS — M5031 Other cervical disc degeneration,  high cervical region: Secondary | ICD-10-CM | POA: Insufficient documentation

## 2018-02-25 DIAGNOSIS — M542 Cervicalgia: Secondary | ICD-10-CM | POA: Diagnosis not present

## 2018-02-25 DIAGNOSIS — M5030 Other cervical disc degeneration, unspecified cervical region: Secondary | ICD-10-CM | POA: Diagnosis not present

## 2018-02-25 DIAGNOSIS — M4802 Spinal stenosis, cervical region: Secondary | ICD-10-CM | POA: Diagnosis not present

## 2018-02-25 MED ORDER — BACLOFEN 10 MG PO TABS: 10.0000 mg | ORAL_TABLET | Freq: Two times a day (BID) | ORAL | 0 refills | Status: DC

## 2018-02-25 NOTE — Patient Instructions (Addendum)
   Please bring a copy of your labs from the [place], including thyroid functions, so we can update your medication records   Go to the [place] on [facility] for neck Xray   You can try taking one baclofen every 12 hours for the next couple days to see if stops then neck spasms

## 2018-02-25 NOTE — Progress Notes (Signed)
Patient: Ryan Ali Male    DOB: [birth year]   77 y.o.   MRN: [id number] Visit Date: 02/25/2018  Today's Provider: [name], MD   Chief Complaint  Patient presents with  . Neck Pain    Started about a year ago.  Pt states it feels like a muscle cramp.   Subjective:    Neck Pain   This is a chronic problem. The current episode started more than 1 year ago. The problem has been gradually worsening. The pain is present in the left side, midline and right side. The quality of the pain is described as cramping. Nothing aggravates the symptoms. Pertinent negatives include no chest pain, fever, headaches, leg pain, numbness, pain with swallowing, paresis, photophobia, syncope, tingling, trouble swallowing, visual change, weakness or weight loss. He has tried nothing for the symptoms.  Pain is describe as cramp which can occur an either side of neck and lasts anywhere from 1/2 minute to a couple of minutes. No pain between episodes. Episodes have been occurring more frequently over the last few months and are no occurring 2-3 times a week. No specific triggers. Pain does not radiate. Not taken any OTC medications. No other unusual cramping. He does have history of DDD of neck, with last x-ray c-spine in 2014.  Findings:  The cervical spine is imaged from the skull base through C6. C7 is seen on  the swimmer's view. There is mild degenerative disc disease in the mid and  lower cervical spine. There is normal alignment. Prevertebral soft tissues  are within normal limits. Uncovertebral hypertrophy is seen in the lower  cervical spine.   IMPRESSION:  Multilevel degenerative disc disease in the mid and lower cervical spine.   He does have prescription for baclofen that was originally prescribed for shoulder pain which has resolved. He states he tolerating it well and had no adverse effects when he was taking it.     No Known Allergies   Current Outpatient Medications:  .   albuterol (PROVENTIL HFA) 108 (90 BASE) MCG/ACT inhaler, PROVENTIL HFA, 108 (90 Base)MCG/ACT (Inhalation Aerosol Solution)  for 0 days  Quantity: 0.00;  Refills: 0   Ordered :16-Jan-2010  [name] ;  Started 13-Jan-2008 Active, Disp: , Rfl:  .  aspirin 81 MG tablet, Take 1 tablet by mouth daily. Reported on 05/12/2015, Disp: , Rfl:  .  atenolol (TENORMIN) 25 MG tablet, TK 1 T PO QD, Disp: , Rfl: 3 .  brimonidine (ALPHAGAN) 0.2 % ophthalmic solution, INT 1 GTT IN OD BID, Disp: , Rfl: 5 .  Calcium Carb-Cholecalciferol 600-800 MG-UNIT CHEW, Chew by mouth., Disp: , Rfl:  .  Cholecalciferol (VITAMIN D PO), Take by mouth., Disp: , Rfl:  .  HYDROcodone-acetaminophen (VICODIN) 5-500 MG per tablet, Take 2 tablets by mouth Nightly., Disp: , Rfl:  .  levothyroxine (SYNTHROID, LEVOTHROID) 150 MCG tablet, TAKE 1 TABLET(150 MCG) BY MOUTH DAILY, Disp: 90 tablet, Rfl: 4 .  Omeprazole 20 MG TBEC, Take 2 tablets by mouth daily., Disp: , Rfl:  .  tiotropium (SPIRIVA HANDIHALER) 18 MCG inhalation capsule, SPIRIVA HANDIHALER, 18MCG (Inhalation Capsule)  for 0 days  Quantity: 0.00;  Refills: 0   Ordered :16-Jan-2010  [name] ;  Started 13-Jan-2008 Active, Disp: , Rfl:  .  baclofen (LIORESAL) 10 MG tablet, Take 10 mg by mouth 3 (three) times daily., Disp: , Rfl:  .  budesonide-formoterol (SYMBICORT) 160-4.5 MCG/ACT inhaler, Inhale 2 puffs into the lungs 2 (  two) times daily., Disp: , Rfl:  .  Calcium Carbonate-Vit D-Min (CALCIUM 600+D PLUS MINERALS) 600-400 MG-UNIT CHEW, Chew 3 tablets by mouth daily., Disp: , Rfl:  .  etodolac (LODINE) 500 MG tablet, Take by mouth., Disp: , Rfl:  .  GLUCOSE BLOOD VI, Freestyle test strips. Check blood sugar daily., Disp: , Rfl:  .  meclizine (ANTIVERT) 25 MG tablet, 1/2 to one pill 3 x day as needed for vertigo (Patient not taking: Reported on 02/25/2018), Disp: 12 tablet, Rfl: 0  Review of Systems  Constitutional: Negative.  Negative for fever and weight loss.    HENT: Negative for trouble swallowing.   Eyes: Negative for photophobia.  Cardiovascular: Negative for chest pain and syncope.  Musculoskeletal: Positive for neck pain and neck stiffness. Negative for arthralgias, back pain, gait problem, joint swelling and myalgias.  Neurological: Negative for dizziness, tingling, weakness, light-headedness, numbness and headaches.    Social History   Tobacco Use  . Smoking status: Former Smoker    Last attempt to quit: 03/25/2005    Years since quitting: 12.9  . Smokeless tobacco: Never Used  Substance Use Topics  . Alcohol use: No    Alcohol/week: 0.0 standard drinks   Objective:   BP 118/62 (BP Location: Right Arm, Patient Position: Sitting, Cuff Size: Large)   Pulse 64   Temp 97.8 F (36.6 C) (Oral)   Resp 16   Wt 238 lb (108 kg)   BMI 38.41 kg/m  Vitals:   02/25/18 1352  BP: 118/62  Pulse: 64  Resp: 16  Temp: 97.8 F (36.6 C)  TempSrc: Oral  Weight: 238 lb (108 kg)     Physical Exam  General appearance: alert, well developed, well nourished, cooperative and in no distress Head: Normocephalic, without obvious abnormality, atraumatic Respiratory: Respirations even and unlabored, normal respiratory rate Extremities: No gross deformities MS: Mild stiffness at limits of ROM. No tenderness of c-spine or paracervical muscles.      Assessment & Plan:     1. Neck pain, bilateral Brief spasms that are getting increasingly frequent, he does have prescription baclofen which he can start. Need updated xray x-spine due to DDD history as below.  - DG Cervical Spine Complete; Future - baclofen (LIORESAL) 10 MG tablet; Take 1 tablet (10 mg total) by mouth every 12 (twelve) hours.  Dispense: 1 each; Refill: 0  2. DDD (degenerative disc disease), cervical  - DG Cervical Spine Complete; Future  Consider PT or orthopedic referral if baclofen not effective.        [name], MD  [facility]

## 2018-02-26 DIAGNOSIS — M5030 Other cervical disc degeneration, unspecified cervical region: Secondary | ICD-10-CM | POA: Insufficient documentation

## 2018-03-19 DIAGNOSIS — H7012 Chronic mastoiditis, left ear: Secondary | ICD-10-CM | POA: Diagnosis not present

## 2018-03-19 DIAGNOSIS — H903 Sensorineural hearing loss, bilateral: Secondary | ICD-10-CM | POA: Diagnosis not present

## 2018-06-11 ENCOUNTER — Ambulatory Visit (INDEPENDENT_AMBULATORY_CARE_PROVIDER_SITE_OTHER): Payer: PPO | Admitting: Vascular Surgery

## 2018-06-11 ENCOUNTER — Other Ambulatory Visit (INDEPENDENT_AMBULATORY_CARE_PROVIDER_SITE_OTHER): Payer: PPO

## 2018-09-18 ENCOUNTER — Other Ambulatory Visit: Payer: Self-pay | Admitting: Family Medicine

## 2018-09-18 DIAGNOSIS — E039 Hypothyroidism, unspecified: Secondary | ICD-10-CM

## 2018-10-02 ENCOUNTER — Ambulatory Visit (INDEPENDENT_AMBULATORY_CARE_PROVIDER_SITE_OTHER): Payer: PPO | Admitting: Family Medicine

## 2018-10-02 ENCOUNTER — Other Ambulatory Visit: Payer: Self-pay | Admitting: Family Medicine

## 2018-10-02 ENCOUNTER — Other Ambulatory Visit: Payer: Self-pay

## 2018-10-02 ENCOUNTER — Encounter: Payer: Self-pay | Admitting: Family Medicine

## 2018-10-02 VITALS — BP 126/74 | HR 68 | Resp 20 | Wt 238.6 lb

## 2018-10-02 DIAGNOSIS — R0609 Other forms of dyspnea: Secondary | ICD-10-CM

## 2018-10-02 DIAGNOSIS — E890 Postprocedural hypothyroidism: Secondary | ICD-10-CM | POA: Diagnosis not present

## 2018-10-02 DIAGNOSIS — I83893 Varicose veins of bilateral lower extremities with other complications: Secondary | ICD-10-CM

## 2018-10-02 DIAGNOSIS — R0600 Dyspnea, unspecified: Secondary | ICD-10-CM

## 2018-10-02 DIAGNOSIS — R609 Edema, unspecified: Secondary | ICD-10-CM

## 2018-10-02 MED ORDER — FUROSEMIDE 20 MG PO TABS: 20.0000 mg | ORAL_TABLET | Freq: Every day | ORAL | 0 refills | Status: DC | PRN

## 2018-10-02 NOTE — Progress Notes (Signed)
Patient: Ryan Ali Male    DOB: July 02, 1940   78 y.o.   MRN: [id number] Visit Date: 10/02/2018  Today's Provider: [name], MD   Chief Complaint  Patient presents with  . Edema   Subjective:     HPI  Edema  Patient presents in office today with complaints of swelling in his lower extremities for the past 7 days. Patient reports history of hypertension and reports good compliance on Atenolol. Patient states that he has numbness but also has a history of neuropathy, he has been trying to elevate his legs at night to help reduce swelling with no improvement. Has been a bit more short of breath the last few weeks. No chest pains or palpations. Using albuterol fairly consistently.   No Known Allergies   Current Outpatient Medications:  .  albuterol (PROVENTIL HFA) 108 (90 BASE) MCG/ACT inhaler, PROVENTIL HFA, 108 (90 Base)MCG/ACT (Inhalation Aerosol Solution)  for 0 days  Quantity: 0.00;  Refills: 0   Ordered :16-Jan-2010  [name] ;  Started 13-Jan-2008 Active, Disp: , Rfl:  .  Ascorbic Acid (VITAMIN C PO), Take 1,400 mg by mouth., Disp: , Rfl:  .  aspirin 81 MG tablet, Take 1 tablet by mouth daily. Reported on 05/12/2015, Disp: , Rfl:  .  ASTAXANTHIN PO, Take 12 mg by mouth., Disp: , Rfl:  .  atenolol (TENORMIN) 25 MG tablet, TK 1 T PO QD, Disp: , Rfl: 3 .  baclofen (LIORESAL) 10 MG tablet, Take 1 tablet (10 mg total) by mouth every 12 (twelve) hours., Disp: 1 each, Rfl: 0 .  brimonidine (ALPHAGAN) 0.2 % ophthalmic solution, INT 1 GTT IN OD BID, Disp: , Rfl: 5 .  budesonide-formoterol (SYMBICORT) 160-4.5 MCG/ACT inhaler, Inhale 2 puffs into the lungs 2 (two) times daily., Disp: , Rfl:  .  Calcium Carb-Cholecalciferol 600-800 MG-UNIT CHEW, Chew by mouth., Disp: , Rfl:  .  Calcium Carbonate-Vit D-Min (CALCIUM 600+D PLUS MINERALS) 600-400 MG-UNIT CHEW, Chew 3 tablets by mouth daily., Disp: , Rfl:  .  Cholecalciferol (VITAMIN D PO), Take by mouth., Disp: , Rfl:   .  etodolac (LODINE) 500 MG tablet, Take by mouth., Disp: , Rfl:  .  GLUCOSE BLOOD VI, Freestyle test strips. Check blood sugar daily., Disp: , Rfl:  .  HYDROcodone-acetaminophen (VICODIN) 5-500 MG per tablet, Take 2 tablets by mouth Nightly., Disp: , Rfl:  .  lactobacillus acidophilus (BACID) TABS tablet, Take 1 tablet by mouth 1 day or 1 dose., Disp: , Rfl:  .  levothyroxine (SYNTHROID) 150 MCG tablet, TAKE 1 TABLET(150 MCG) BY MOUTH DAILY, Disp: 90 tablet, Rfl: 1 .  meclizine (ANTIVERT) 25 MG tablet, 1/2 to one pill 3 x day as needed for vertigo, Disp: 12 tablet, Rfl: 0 .  Omeprazole 20 MG TBEC, Take 2 tablets by mouth daily., Disp: , Rfl:  .  QUERCETIN PO, Take 800 mg by mouth daily., Disp: , Rfl:  .  tiotropium (SPIRIVA HANDIHALER) 18 MCG inhalation capsule, SPIRIVA HANDIHALER, 18MCG (Inhalation Capsule)  for 0 days  Quantity: 0.00;  Refills: 0   Ordered :16-Jan-2010  [name] ;  Started 13-Jan-2008 Active, Disp: , Rfl:   Review of Systems  Constitutional: Negative.   HENT: Negative.   Respiratory: Negative.   Gastrointestinal: Negative.   Genitourinary: Negative.   Musculoskeletal: Positive for arthralgias, back pain and joint swelling. Negative for myalgias, neck pain and neck stiffness.  Neurological: Positive for numbness. Negative for dizziness, light-headedness and headaches.  Social History   Tobacco Use  . Smoking status: Former Smoker    Quit date: 03/25/2005    Years since quitting: 13.5  . Smokeless tobacco: Never Used  Substance Use Topics  . Alcohol use: No    Alcohol/week: 0.0 standard drinks      Objective:   BP 126/74   Pulse 68   Resp 20   Wt 238 lb 9.6 oz (108.2 kg)   SpO2 91%   BMI 38.51 kg/m  Vitals:   10/02/18 1455  BP: 126/74  Pulse: 68  Resp: 20  SpO2: 91%  Weight: 238 lb 9.6 oz (108.2 kg)     Physical Exam    General Appearance:    Alert, cooperative, no distress  Eyes:    PERRL, conjunctiva/corneas clear, EOM's intact        Lungs:     Clear to auscultation bilaterally, respirations unlabored  Heart:    Regular rate and rhythm  Neurologic:   Awake, alert, oriented x 3. No apparent focal neurological           defect.   Ext:   3+ ankle edema with extensive varicosities. No calf and thigh tenderness. No cords. Negative Homan's sign.         Assessment & Plan    1. Edema, unspecified type Extensive differential. Need to rule out CHF with labs. Can start furosemide.  - Comprehensive metabolic panel - CBC - Brain natriuretic peptide - furosemide (LASIX) 20 MG tablet; Take 1 tablet (20 mg total) by mouth daily as needed for edema.  Dispense: 30 tablet; Refill: 0  2. Hypothyroidism, postop - TSH  3. Varicose veins of both legs with edema May be responsible for swelling. Should wear compression stockings.   4. Dyspnea on exertion  - EKG 12-Lead - Comprehensive metabolic panel - TSH - CBC - Brain natriuretic peptide      [name], MD  [facility]  The entirety of the information documented in the History of Present Illness, Review of Systems and Physical Exam were personally obtained by me. Portions of this information were initially documented by Minette Headland, CMA and reviewed by me for thoroughness and accuracy.

## 2018-10-03 LAB — CBC
Hematocrit: 39.6 % (ref 37.5–51.0)
Hemoglobin: 11.7 g/dL — ABNORMAL LOW (ref 13.0–17.7)
MCH: 24.9 pg — ABNORMAL LOW (ref 26.6–33.0)
MCHC: 29.5 g/dL — ABNORMAL LOW (ref 31.5–35.7)
MCV: 84 fL (ref 79–97)
Platelets: 150 10*3/uL (ref 150–450)
RBC: 4.69 x10E6/uL (ref 4.14–5.80)
RDW: 16.5 % — ABNORMAL HIGH (ref 11.6–15.4)
WBC: 7.3 10*3/uL (ref 3.4–10.8)

## 2018-10-03 LAB — COMPREHENSIVE METABOLIC PANEL
ALT: 9 IU/L (ref 0–44)
AST: 14 IU/L (ref 0–40)
Albumin/Globulin Ratio: 1.8 (ref 1.2–2.2)
Albumin: 3.9 g/dL (ref 3.7–4.7)
Alkaline Phosphatase: 89 IU/L (ref 39–117)
BUN/Creatinine Ratio: 19 (ref 10–24)
BUN: 19 mg/dL (ref 8–27)
Bilirubin Total: 0.3 mg/dL (ref 0.0–1.2)
CO2: 25 mmol/L (ref 20–29)
Calcium: 9.1 mg/dL (ref 8.6–10.2)
Chloride: 104 mmol/L (ref 96–106)
Creatinine, Ser: 0.98 mg/dL (ref 0.76–1.27)
GFR calc Af Amer: 84 mL/min/{1.73_m2} (ref >59)
GFR calc non Af Amer: 73 mL/min/{1.73_m2} (ref >59)
Globulin, Total: 2.2 g/dL (ref 1.5–4.5)
Glucose: 119 mg/dL — ABNORMAL HIGH (ref 65–99)
Potassium: 5 mmol/L (ref 3.5–5.2)
Sodium: 144 mmol/L (ref 134–144)
Total Protein: 6.1 g/dL (ref 6.0–8.5)

## 2018-10-03 LAB — BRAIN NATRIURETIC PEPTIDE: BNP: 108.9 pg/mL — ABNORMAL HIGH (ref 0.0–100.0)

## 2018-10-03 LAB — TSH: TSH: 0.357 u[IU]/mL — ABNORMAL LOW (ref 0.450–4.500)

## 2018-10-05 ENCOUNTER — Telehealth: Payer: Self-pay

## 2018-10-05 NOTE — Telephone Encounter (Signed)
Patients wife has been advised. KW 

## 2018-10-05 NOTE — Telephone Encounter (Signed)
-----   Message from Birdie Sons, MD sent at 10/04/2018  7:45 PM EDT ----- Labs are are normal. No sign of heart failure. Can take the furosemide as needed for swelling.

## 2018-10-08 ENCOUNTER — Telehealth: Payer: Self-pay | Admitting: Family Medicine

## 2018-10-08 NOTE — Telephone Encounter (Signed)
Pt wife called saying she looked in her husbands chart and his labs were off.  She said they were called and told they were all ok.  She would like someone to call her back  [phone]  [facility]

## 2018-10-08 NOTE — Telephone Encounter (Signed)
There was nothing that was significantly out of normal range. Heart failure typically causes the BNP to go up to 200 to 2000, his was only slightly above normal range at 108 which is pretty typical for healthy men his age.   He had a slightly low hemoglobin, but considering RBC and hematocrit were normal, he is not considered to be anemic.   His TSH was slightly low indicating he may be getting slightly more thyroid hormone than he needs. But its common for TSH to fluctuate and I would not recommend cutting dose which would probably cause more fatigue.   It might worthwhile to return in a 3-4 weeks to recheck levels to make sure these labs are not trending away from normal. He can take the furosemide 3-4 days a week as needed for swelling.

## 2018-10-09 NOTE — Telephone Encounter (Signed)
Mrs. Cervone advised.   Thanks,   -Mickel Baas

## 2018-10-12 NOTE — Patient Instructions (Signed)
.   Please review the attached list of medications and notify my office if there are any errors.   . Please bring all of your medications to every appointment so we can make sure that our medication list is the same as yours.   . We will have flu vaccines available after Labor Day. Please go to your pharmacy or call the office in early September to schedule you flu shot.   

## 2018-11-11 DIAGNOSIS — H401131 Primary open-angle glaucoma, bilateral, mild stage: Secondary | ICD-10-CM | POA: Diagnosis not present

## 2018-12-07 ENCOUNTER — Telehealth: Payer: Self-pay | Admitting: Family Medicine

## 2018-12-07 DIAGNOSIS — D649 Anemia, unspecified: Secondary | ICD-10-CM

## 2018-12-07 DIAGNOSIS — E890 Postprocedural hypothyroidism: Secondary | ICD-10-CM

## 2018-12-07 DIAGNOSIS — R0609 Other forms of dyspnea: Secondary | ICD-10-CM

## 2018-12-07 DIAGNOSIS — R0600 Dyspnea, unspecified: Secondary | ICD-10-CM

## 2018-12-07 DIAGNOSIS — R609 Edema, unspecified: Secondary | ICD-10-CM

## 2018-12-07 NOTE — Telephone Encounter (Signed)
Pt's wife calling regarding pt needing to have the labs drawn on Mon 21st at 9 am. He is requesting the same labs to be done and needing the orders put up front by 9 am on Mon. 21st.  Thanks, [facility]

## 2018-12-07 NOTE — Telephone Encounter (Signed)
Patient's wife called requesting lab slip. Patient plans to get labs done on 12/14/2018. Please advise.

## 2018-12-08 NOTE — Telephone Encounter (Signed)
Please print and leave order at leave and advise patient order has been placed.

## 2018-12-09 NOTE — Telephone Encounter (Signed)
LMTCB

## 2018-12-14 ENCOUNTER — Ambulatory Visit (INDEPENDENT_AMBULATORY_CARE_PROVIDER_SITE_OTHER): Payer: PPO

## 2018-12-14 ENCOUNTER — Ambulatory Visit (INDEPENDENT_AMBULATORY_CARE_PROVIDER_SITE_OTHER): Payer: PPO | Admitting: Nurse Practitioner

## 2018-12-14 ENCOUNTER — Other Ambulatory Visit: Payer: Self-pay

## 2018-12-14 ENCOUNTER — Encounter (INDEPENDENT_AMBULATORY_CARE_PROVIDER_SITE_OTHER): Payer: Self-pay | Admitting: Nurse Practitioner

## 2018-12-14 VITALS — BP 126/69 | HR 77 | Resp 16 | Wt 239.0 lb

## 2018-12-14 DIAGNOSIS — J449 Chronic obstructive pulmonary disease, unspecified: Secondary | ICD-10-CM | POA: Diagnosis not present

## 2018-12-14 DIAGNOSIS — E890 Postprocedural hypothyroidism: Secondary | ICD-10-CM | POA: Diagnosis not present

## 2018-12-14 DIAGNOSIS — R609 Edema, unspecified: Secondary | ICD-10-CM | POA: Diagnosis not present

## 2018-12-14 DIAGNOSIS — E119 Type 2 diabetes mellitus without complications: Secondary | ICD-10-CM | POA: Diagnosis not present

## 2018-12-14 DIAGNOSIS — I714 Abdominal aortic aneurysm, without rupture, unspecified: Secondary | ICD-10-CM

## 2018-12-14 DIAGNOSIS — M1990 Unspecified osteoarthritis, unspecified site: Secondary | ICD-10-CM | POA: Diagnosis not present

## 2018-12-14 DIAGNOSIS — D649 Anemia, unspecified: Secondary | ICD-10-CM | POA: Diagnosis not present

## 2018-12-14 DIAGNOSIS — R0609 Other forms of dyspnea: Secondary | ICD-10-CM | POA: Diagnosis not present
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
## 2018-12-15 LAB — BRAIN NATRIURETIC PEPTIDE: BNP: 110.6 pg/mL — ABNORMAL HIGH (ref 0.0–100.0)

## 2018-12-15 LAB — HEMOGLOBIN: Hemoglobin: 12.1 g/dL — ABNORMAL LOW (ref 13.0–17.7)

## 2018-12-15 LAB — T4, FREE: Free T4: 1.34 ng/dL (ref 0.82–1.77)

## 2018-12-15 LAB — TSH: TSH: 1.27 u[IU]/mL (ref 0.450–4.500)

## 2018-12-18 ENCOUNTER — Telehealth: Payer: Self-pay

## 2018-12-18 NOTE — Telephone Encounter (Signed)
-----   Message from Birdie Sons, MD sent at 12/17/2018  4:26 PM EDT ----- Thyroid functions are normal. Hemoglobin is better, nearly back to normal. Slightly elevated BNP, could indicate very mild CHF. If still feeling short of breath with exertion then we can order echocardiogram to assess cardiac function.

## 2018-12-18 NOTE — Telephone Encounter (Signed)
Wife has been advised, states that she has to contact insurance first to see how much she would have to pay out of pocket for echocardiogram and will give [place] a call back

## 2019-01-06 ENCOUNTER — Ambulatory Visit (INDEPENDENT_AMBULATORY_CARE_PROVIDER_SITE_OTHER): Payer: PPO

## 2019-01-06 ENCOUNTER — Other Ambulatory Visit: Payer: Self-pay

## 2019-01-06 DIAGNOSIS — Z23 Encounter for immunization: Secondary | ICD-10-CM | POA: Diagnosis not present

## 2019-01-12 DIAGNOSIS — H401111 Primary open-angle glaucoma, right eye, mild stage: Secondary | ICD-10-CM | POA: Diagnosis not present

## 2019-02-17 ENCOUNTER — Other Ambulatory Visit: Payer: Self-pay

## 2019-07-04 ENCOUNTER — Other Ambulatory Visit: Payer: Self-pay | Admitting: Family Medicine

## 2019-07-04 DIAGNOSIS — E039 Hypothyroidism, unspecified: Secondary | ICD-10-CM

## 2019-07-04 NOTE — Telephone Encounter (Signed)
Requested Prescriptions  Pending Prescriptions Disp Refills  . levothyroxine (SYNTHROID) 150 MCG tablet [Pharmacy Med Name: LEVOTHYROXINE 0.150MG  (150MCG) TAB] 90 tablet 1    Sig: TAKE 1 TABLET(150 MCG) BY MOUTH DAILY     Endocrinology:  Hypothyroid Agents Failed - 07/04/2019  1:54 PM      Failed - TSH needs to be rechecked within 3 months after an abnormal result. Refill until TSH is due.      Passed - TSH in normal range and within 360 days    TSH  Date Value Ref Range Status  12/14/2018 1.270 0.450 - 4.500 uIU/mL Final         Passed - Valid encounter within last 12 months    Recent Outpatient Visits          9 months ago Edema, unspecified type   [facility] Birdie Sons, MD   1 year ago Neck pain, bilateral   [facility] Birdie Sons, MD   2 years ago [place], Donald E, MD   3 years ago Annual physical exam   [facility] Birdie Sons, MD   3 years ago [place], [place]

## 2019-08-12 DIAGNOSIS — M12811 Other specific arthropathies, not elsewhere classified, right shoulder: Secondary | ICD-10-CM | POA: Diagnosis not present

## 2019-08-12 DIAGNOSIS — M75101 Unspecified rotator cuff tear or rupture of right shoulder, not specified as traumatic: Secondary | ICD-10-CM | POA: Diagnosis not present

## 2019-08-12 DIAGNOSIS — M25511 Pain in right shoulder: Secondary | ICD-10-CM | POA: Diagnosis not present

## 2019-08-16 ENCOUNTER — Ambulatory Visit
Admission: RE | Admit: 2019-08-16 | Discharge: 2019-08-16 | Disposition: A | Discharge status: Home / Self Care | Payer: PPO | Source: Ambulatory Visit | Attending: Family Medicine | Admitting: Family Medicine

## 2019-08-16 ENCOUNTER — Ambulatory Visit: Payer: Self-pay

## 2019-08-16 ENCOUNTER — Encounter: Payer: Self-pay | Admitting: Family Medicine

## 2019-08-16 ENCOUNTER — Ambulatory Visit (INDEPENDENT_AMBULATORY_CARE_PROVIDER_SITE_OTHER): Payer: PPO | Admitting: Family Medicine

## 2019-08-16 ENCOUNTER — Other Ambulatory Visit: Payer: Self-pay

## 2019-08-16 VITALS — BP 120/76 | HR 69 | Temp 97.3°F | Resp 18 | Wt 232.0 lb

## 2019-08-16 DIAGNOSIS — S299XXA Unspecified injury of thorax, initial encounter: Secondary | ICD-10-CM | POA: Diagnosis not present

## 2019-08-16 DIAGNOSIS — J449 Chronic obstructive pulmonary disease, unspecified: Secondary | ICD-10-CM | POA: Diagnosis not present

## 2019-08-16 DIAGNOSIS — M25532 Pain in left wrist: Secondary | ICD-10-CM | POA: Insufficient documentation

## 2019-08-16 DIAGNOSIS — R0781 Pleurodynia: Secondary | ICD-10-CM | POA: Insufficient documentation

## 2019-08-16 DIAGNOSIS — S52502A Unspecified fracture of the lower end of left radius, initial encounter for closed fracture: Secondary | ICD-10-CM | POA: Diagnosis not present

## 2019-08-16 NOTE — Progress Notes (Signed)
Ryan Ali,acting as a scribe for Ryan Huh, MD.,have documented all relevant documentation on the behalf of Ryan Huh, MD,as directed by  Ryan Huh, MD while in the presence of Ryan Huh, MD.  Established patient visit   Patient: Ryan Ali   DOB: [birth year]   79 y.o. Male  MRN: [id number] Visit Date: 08/16/2019  Today's healthcare provider: [name], MD   Chief Complaint  Patient presents with  . Wrist Pain   Subjective    Wrist Injury  Incident onset: Saturday; 2 days ago. The incident occurred in the yard (triped over a step in the garden). The injury mechanism was a fall. The pain is present in the left wrist (also left ribs). Pertinent negatives include no chest pain, numbness or tingling. He has tried ice (wrist brace) for the symptoms. The treatment provided mild relief.     Medications: Outpatient Medications Prior to Visit  Medication Sig  . albuterol (PROVENTIL HFA) 108 (90 BASE) MCG/ACT inhaler PROVENTIL HFA, 108 (90 Base)MCG/ACT (Inhalation Aerosol Solution)  for 0 days  Quantity: 0.00;  Refills: 0   Ordered :16-Jan-2010  Ryan Ali ;  Started 13-Jan-2008 Active  . Ascorbic Acid (VITAMIN C PO) Take 1,400 mg by mouth.  Marland Kitchen aspirin 81 MG tablet Take 1 tablet by mouth daily. Reported on 05/12/2015  . ASTAXANTHIN PO Take 12 mg by mouth.  Marland Kitchen atenolol (TENORMIN) 25 MG tablet TK 1 T PO QD  . baclofen (LIORESAL) 10 MG tablet Take 1 tablet (10 mg total) by mouth every 12 (twelve) hours.  . brimonidine (ALPHAGAN) 0.2 % ophthalmic solution INT 1 GTT IN OD BID  . budesonide-formoterol (SYMBICORT) 160-4.5 MCG/ACT inhaler Inhale 2 puffs into the lungs 2 (two) times daily.  . Calcium Carb-Cholecalciferol 600-800 MG-UNIT CHEW Chew by mouth.  . Calcium Carbonate-Vit D-Min (CALCIUM 600+D PLUS MINERALS) 600-400 MG-UNIT CHEW Chew 3 tablets by mouth daily.  . Cholecalciferol (VITAMIN D PO) Take by mouth.  . etodolac (LODINE) 500 MG  tablet Take by mouth.  . furosemide (LASIX) 20 MG tablet Take 1 tablet (20 mg total) by mouth daily as needed for edema.  Marland Kitchen GLUCOSE BLOOD VI Freestyle test strips. Check blood sugar daily.  Marland Kitchen HYDROcodone-acetaminophen (VICODIN) 5-500 MG per tablet Take 2 tablets by mouth Nightly.  . lactobacillus acidophilus (BACID) TABS tablet Take 1 tablet by mouth 1 day or 1 dose.  . levothyroxine (SYNTHROID) 150 MCG tablet TAKE 1 TABLET(150 MCG) BY MOUTH DAILY  . meclizine (ANTIVERT) 25 MG tablet 1/2 to one pill 3 x day as needed for vertigo  . Omeprazole 20 MG TBEC Take 2 tablets by mouth daily.  Marland Kitchen QUERCETIN PO Take 800 mg by mouth daily.  Marland Kitchen tiotropium (SPIRIVA HANDIHALER) 18 MCG inhalation capsule SPIRIVA HANDIHALER, 18MCG (Inhalation Capsule)  for 0 days  Quantity: 0.00;  Refills: 0   Ordered :16-Jan-2010  Ryan Ali ;  Started 13-Jan-2008 Active   No facility-administered medications prior to visit.    Review of Systems  Constitutional: Negative for appetite change, chills and fever.  Respiratory: Negative for chest tightness, shortness of breath and wheezing.   Cardiovascular: Negative for chest pain and palpitations.  Gastrointestinal: Negative for abdominal pain, nausea and vomiting.  Musculoskeletal: Positive for arthralgias and joint swelling.       Pain on the Left side of ribs  Neurological: Positive for dizziness (in the mornings). Negative for tingling and numbness.  Hematological: Bruises/bleeds easily.      Objective  BP 120/76 (BP Location: Right Arm, Patient Position: Sitting, Cuff Size: Large)   Pulse 69   Temp (!) 97.3 F (36.3 C) (Temporal)   Resp 18   Wt 232 lb (105.2 kg)   SpO2 96% Comment: room air  BMI 37.45 kg/m    Physical Exam  Moderately swollen left wrist. From all digits but severe restricted ROM of wrist. Tender over distal wrist, no gross deformities. Tender over left ribs, slightly swollen. No gross deformities.  Lungs with diminished  breath sounds. No wheezing. Respirations even and unlabored.      Assessment & Plan     1. Left wrist pain Continue wearing commercial wrist brace and 2-3 /day ice application for 1 more day.  - DG Wrist Complete Left; Future  2. Rib pain on left side  - DG Ribs Unilateral Left; Future  3. COPD Stable lung exam today. Continue current inhalers.        The entirety of the information documented in the History of Present Illness, Review of Systems and Physical Exam were personally obtained by me. Portions of this information were initially documented by the CMA and reviewed by me for thoroughness and accuracy.      Ryan Huh, MD  [facility] [phone] (phone) [phone] (fax)  [place]

## 2019-08-16 NOTE — Telephone Encounter (Signed)
Wife reports pt. Fell at home Saturday in the garden. Injured left wrist. Swollen and bruised. Can wiggle fingers. Appointment made for today. Reason for Disposition . SEVERE joint swelling (e.g., can barely bend or move wrist joint)  Answer Assessment - Initial Assessment Questions 1. ONSET: "When did the swelling start?" (e.g., minutes, hours, days, weeks)     Started Saturday 2. LOCATION: "What part of the wrist is swollen?"  "Are both wrists swollen or just one wrist?"     Left 3. SEVERITY: "How bad is the swelling?"    - BALL OR LUMP: small ball or lump   - SKIN ONLY: localized; puffy or swollen area or patch of skin   - MILD JOINT SWELLING: joint feels or looks mildly swollen or puffy   - MODERATE JOINT SWELLING: moderate joint swelling; looks swollen   - SEVERE JOINT SWELLING:  severe joint swelling; can barely bend or move joint     Mild 4. RECURRENT SYMPTOM: "Have you had wrist swelling before?" If so, ask: "When was the last time?" "What happened that time?"     No 5. CAUSE: "What do you think is causing the wrist swelling?" (e.g., arthritis, ganglion cyst, insect bite, recent injury)     Fell 6. OTHER SYMPTOMS: "Do you have any other symptoms?" (e.g., fever, hand pain)     No 7. PREGNANCY: "Is there any chance you are pregnant?" "When was your last menstrual period?"     n/a  Protocols used: WRIST [facility]

## 2019-08-16 NOTE — Patient Instructions (Signed)
.   Please review the attached list of medications and notify my office if there are any errors.   . Please bring all of your medications to every appointment so we can make sure that our medication list is the same as yours.   

## 2019-08-17 ENCOUNTER — Telehealth: Payer: Self-pay

## 2019-08-17 ENCOUNTER — Other Ambulatory Visit: Payer: Self-pay | Admitting: Family Medicine

## 2019-08-17 DIAGNOSIS — S62102A Fracture of unspecified carpal bone, left wrist, initial encounter for closed fracture: Secondary | ICD-10-CM

## 2019-08-17 NOTE — Telephone Encounter (Signed)
-----   Message from Birdie Sons, MD sent at 08/17/2019 10:00 AM EDT ----- Odette Horns shows 2 fracture in his wrist. No new rib fractures. Needs referral to Orthopedics this week to follow wrist fracture. In the meantime just keep wearing wrist brace

## 2019-08-17 NOTE — Telephone Encounter (Signed)
Pt's wife given result per Dr Caryn Section; she verbalized understanding, and is awaiting call from ortho; will route to office for notification.

## 2019-08-17 NOTE — Telephone Encounter (Signed)
Attempted to contact patient, no answer left a  voicemail. Okay for [facility] to advise patient and route back to the office.

## 2019-08-18 DIAGNOSIS — S2232XA Fracture of one rib, left side, initial encounter for closed fracture: Secondary | ICD-10-CM | POA: Diagnosis not present

## 2019-08-18 DIAGNOSIS — S52502A Unspecified fracture of the lower end of left radius, initial encounter for closed fracture: Secondary | ICD-10-CM | POA: Diagnosis not present

## 2019-08-18 DIAGNOSIS — E669 Obesity, unspecified: Secondary | ICD-10-CM | POA: Diagnosis not present

## 2019-08-18 DIAGNOSIS — R0781 Pleurodynia: Secondary | ICD-10-CM | POA: Diagnosis not present

## 2019-08-18 DIAGNOSIS — W010XXA Fall on same level from slipping, tripping and stumbling without subsequent striking against object, initial encounter: Secondary | ICD-10-CM | POA: Diagnosis not present

## 2019-09-07 DIAGNOSIS — S52502D Unspecified fracture of the lower end of left radius, subsequent encounter for closed fracture with routine healing: Secondary | ICD-10-CM | POA: Diagnosis not present

## 2019-09-07 DIAGNOSIS — S2232XD Fracture of one rib, left side, subsequent encounter for fracture with routine healing: Secondary | ICD-10-CM | POA: Diagnosis not present

## 2019-10-12 DIAGNOSIS — M75101 Unspecified rotator cuff tear or rupture of right shoulder, not specified as traumatic: Secondary | ICD-10-CM | POA: Diagnosis not present

## 2019-10-12 DIAGNOSIS — S2232XD Fracture of one rib, left side, subsequent encounter for fracture with routine healing: Secondary | ICD-10-CM | POA: Diagnosis not present

## 2019-10-12 DIAGNOSIS — E669 Obesity, unspecified: Secondary | ICD-10-CM | POA: Diagnosis not present

## 2019-10-12 DIAGNOSIS — S52502D Unspecified fracture of the lower end of left radius, subsequent encounter for closed fracture with routine healing: Secondary | ICD-10-CM | POA: Diagnosis not present

## 2019-10-12 DIAGNOSIS — M12811 Other specific arthropathies, not elsewhere classified, right shoulder: Secondary | ICD-10-CM | POA: Diagnosis not present

## 2019-10-15 DIAGNOSIS — H401131 Primary open-angle glaucoma, bilateral, mild stage: Secondary | ICD-10-CM | POA: Diagnosis not present

## 2019-11-01 ENCOUNTER — Other Ambulatory Visit: Payer: Self-pay

## 2019-11-01 ENCOUNTER — Encounter: Payer: Self-pay | Admitting: Family Medicine

## 2019-11-01 ENCOUNTER — Ambulatory Visit (INDEPENDENT_AMBULATORY_CARE_PROVIDER_SITE_OTHER): Payer: PPO | Admitting: Family Medicine

## 2019-11-01 VITALS — BP 121/66 | HR 69 | Temp 97.6°F | Ht 66.0 in | Wt 230.8 lb

## 2019-11-01 DIAGNOSIS — Z125 Encounter for screening for malignant neoplasm of prostate: Secondary | ICD-10-CM | POA: Diagnosis not present

## 2019-11-01 DIAGNOSIS — R351 Nocturia: Secondary | ICD-10-CM | POA: Diagnosis not present

## 2019-11-01 DIAGNOSIS — D696 Thrombocytopenia, unspecified: Secondary | ICD-10-CM | POA: Diagnosis not present

## 2019-11-01 DIAGNOSIS — C9001 Multiple myeloma in remission: Secondary | ICD-10-CM

## 2019-11-01 DIAGNOSIS — I714 Abdominal aortic aneurysm, without rupture, unspecified: Secondary | ICD-10-CM

## 2019-11-01 DIAGNOSIS — R609 Edema, unspecified: Secondary | ICD-10-CM | POA: Diagnosis not present

## 2019-11-01 DIAGNOSIS — R0600 Dyspnea, unspecified: Secondary | ICD-10-CM

## 2019-11-01 DIAGNOSIS — E890 Postprocedural hypothyroidism: Secondary | ICD-10-CM | POA: Diagnosis not present

## 2019-11-01 DIAGNOSIS — R350 Frequency of micturition: Secondary | ICD-10-CM

## 2019-11-01 DIAGNOSIS — M25511 Pain in right shoulder: Secondary | ICD-10-CM

## 2019-11-01 DIAGNOSIS — R5383 Other fatigue: Secondary | ICD-10-CM

## 2019-11-01 DIAGNOSIS — R0609 Other forms of dyspnea: Secondary | ICD-10-CM

## 2019-11-01 DIAGNOSIS — R7303 Prediabetes: Secondary | ICD-10-CM | POA: Diagnosis not present

## 2019-11-01 DIAGNOSIS — G8929 Other chronic pain: Secondary | ICD-10-CM

## 2019-11-01 MED ORDER — CELECOXIB 200 MG PO CAPS: 200.0000 mg | ORAL_CAPSULE | Freq: Two times a day (BID) | ORAL | 5 refills | Status: DC | PRN

## 2019-11-01 MED ORDER — TAMSULOSIN HCL 0.4 MG PO CAPS: 0.4000 mg | ORAL_CAPSULE | Freq: Every day | ORAL | 3 refills | Status: DC

## 2019-11-01 NOTE — Progress Notes (Signed)
I,[name],acting as a scribe for [name], MD.,have documented all relevant documentation on the behalf of [name], MD,as directed by  [name], MD while in the presence of [name], MD.  Established patient visit   Patient: Ryan Ali   DOB: Mar 06, 1941   79 y.o. Male  MRN: [id number] Visit Date: 11/01/2019  Today's healthcare provider: [name], MD   Chief Complaint  Patient presents with  . Hypothyroidism  . Hyperglycemia   Subjective    HPI  Hypothyroid, follow-up  Lab Results  Component Value Date   TSH 1.270 12/14/2018   TSH 0.357 (L) 10/02/2018   TSH 1.780 07/17/2015   FREET4 1.34 12/14/2018   T4TOTAL 9.1 07/17/2015   Wt Readings from Last 3 Encounters:  11/01/19 230 lb 12.8 oz (104.7 kg)  08/16/19 232 lb (105.2 kg)  12/14/18 239 lb (108.4 kg)    He was last seen for hypothyroid 1 year ago.  Management since that visit includes continuing same medication. He reports good compliance with treatment. He is not having side effects.   Symptoms: No change in energy level No constipation  No diarrhea No heat / cold intolerance  No nervousness No palpitations  No weight changes    -----------------------------------------------------------------------------------------  Prediabetes, Follow-up  Lab Results  Component Value Date   HGBA1C 6.4 (H) 05/16/2015   HGBA1C 6.3 (A) 03/29/2014   GLUCOSE 119 (H) 10/02/2018   GLUCOSE 111 (H) 02/19/2016   GLUCOSE 98 05/16/2015    Last seen for for this more than 1 year ago.  Current symptoms include none and have been stable.  Prior visit with dietician: no Current diet: in general, an "unhealthy" diet Current exercise: yard work  Pertinent Labs:    Component Value Date/Time   CHOL 184 05/16/2015 0918   TRIG 102 05/16/2015 0918   CHOLHDL 4.2 05/16/2015 0918   CREATININE 0.98 10/02/2018 1548   CREATININE 1.29 11/29/2011 0851    Wt Readings from Last 3  Encounters:  11/01/19 230 lb 12.8 oz (104.7 kg)  08/16/19 232 lb (105.2 kg)  12/14/18 239 lb (108.4 kg)    -----------------------------------------------------------------------------------------  Follow up for COPD:  The patient was last seen for this more than 2 months ago.   Changes made at last visit include none; continue current inhalers.  He reports good compliance with treatment. He feels that condition is stable. He is not having side effects.   -----------------------------------------------------------------------------------------   Medications: Outpatient Medications Prior to Visit  Medication Sig  . HYDROcodone-acetaminophen (VICODIN) 5-500 MG per tablet Take 2 tablets by mouth Nightly.  Marland Kitchen albuterol (PROVENTIL HFA) 108 (90 BASE) MCG/ACT inhaler PROVENTIL HFA, 108 (90 Base)MCG/ACT (Inhalation Aerosol Solution)  for 0 days  Quantity: 0.00;  Refills: 0   Ordered :16-Jan-2010  [name] ;  Started 13-Jan-2008 Active  . Ascorbic Acid (VITAMIN C PO) Take 1,400 mg by mouth.  Marland Kitchen aspirin 81 MG tablet Take 1 tablet by mouth daily. Reported on 05/12/2015  . ASTAXANTHIN PO Take 12 mg by mouth.  Marland Kitchen atenolol (TENORMIN) 25 MG tablet TK 1 T PO QD  . baclofen (LIORESAL) 10 MG tablet Take 1 tablet (10 mg total) by mouth every 12 (twelve) hours.  . brimonidine (ALPHAGAN) 0.2 % ophthalmic solution INT 1 GTT IN OD BID  . budesonide-formoterol (SYMBICORT) 160-4.5 MCG/ACT inhaler Inhale 2 puffs into the lungs 2 (two) times daily.  . Calcium Carb-Cholecalciferol 600-800 MG-UNIT CHEW Chew by mouth.  . Calcium Carbonate-Vit D-Min (CALCIUM  600+D PLUS MINERALS) 600-400 MG-UNIT CHEW Chew 3 tablets by mouth daily.  . Cholecalciferol (VITAMIN D PO) Take by mouth.  . etodolac (LODINE) 500 MG tablet Take by mouth.  . furosemide (LASIX) 20 MG tablet Take 1 tablet (20 mg total) by mouth daily as needed for edema.  Marland Kitchen GLUCOSE BLOOD VI Freestyle test strips. Check blood sugar daily.  Marland Kitchen  lactobacillus acidophilus (BACID) TABS tablet Take 1 tablet by mouth 1 day or 1 dose.  . levothyroxine (SYNTHROID) 150 MCG tablet TAKE 1 TABLET(150 MCG) BY MOUTH DAILY  . meclizine (ANTIVERT) 25 MG tablet 1/2 to one pill 3 x day as needed for vertigo  . Omeprazole 20 MG TBEC Take 2 tablets by mouth daily.  Marland Kitchen QUERCETIN PO Take 800 mg by mouth daily.  Marland Kitchen tiotropium (SPIRIVA HANDIHALER) 18 MCG inhalation capsule SPIRIVA HANDIHALER, 18MCG (Inhalation Capsule)  for 0 days  Quantity: 0.00;  Refills: 0   Ordered :16-Jan-2010  [name] ;  Started 13-Jan-2008 Active   No facility-administered medications prior to visit.    Review of Systems  Constitutional: Negative for appetite change, chills, fatigue and fever.  HENT: Negative for congestion, ear pain, hearing loss, nosebleeds and trouble swallowing.   Eyes: Negative for pain and visual disturbance.  Respiratory: Negative for cough, chest tightness and shortness of breath.   Cardiovascular: Positive for leg swelling. Negative for chest pain and palpitations.  Gastrointestinal: Negative for abdominal pain, blood in stool, constipation, diarrhea, nausea and vomiting.  Endocrine: Negative for polydipsia, polyphagia and polyuria.  Genitourinary: Negative for dysuria and flank pain.  Musculoskeletal: Positive for arthralgias (right shoulder pain due to rotator cuff tear). Negative for back pain, joint swelling, myalgias and neck stiffness.  Skin: Negative for color change, rash and wound.  Neurological: Negative for dizziness, tremors, seizures, speech difficulty, weakness, light-headedness and headaches.  Psychiatric/Behavioral: Negative for behavioral problems, confusion, decreased concentration, dysphoric mood and sleep disturbance. The patient is not nervous/anxious.   All other systems reviewed and are negative.     Objective    BP 121/66 (BP Location: Left Arm, Patient Position: Sitting, Cuff Size: Large)   Pulse 69   Temp  97.6 F (36.4 C) (Oral)   Ht 5' 6"  (1.676 m)   Wt 230 lb 12.8 oz (104.7 kg)   BMI 37.25 kg/m   Physical Exam    General: Appearance:    Obese male in no acute distress  Eyes:    PERRL, conjunctiva/corneas clear, EOM's intact       Lungs:     Clear to auscultation bilaterally, respirations unlabored  Heart:    Normal heart rate. Normal rhythm. No murmurs, rubs, or gallops.   MS:   All extremities are intact.   Neurologic:   Awake, alert, oriented x 3. No apparent focal neurological           defect.   Ext:   3+ bipedal edema.       Assessment & Plan     1. Edema, unspecified type He has furosemide prescription which he can take as needed.   2. Dyspnea on exertion Likely secondary to obesity and COPD - Brain natriuretic peptide  3. Chronic right shoulder pain try- celecoxib (CELEBREX) 200 MG capsule; Take 1 capsule (200 mg total) by mouth 2 (two) times daily as needed.  Dispense: 30 capsule; Refill: 5  4. Other fatigue  - Comprehensive metabolic panel - CBC  5. Urinary frequency Likely BPH, check PSA, try- tamsulosin (FLOMAX) 0.4 MG CAPS  capsule; Take 1 capsule (0.4 mg total) by mouth daily.  Dispense: 30 capsule; Refill: 3  6. Prediabetes  - Hemoglobin A1c  7. Nocturia   8. Hypothyroidism, postop  - TSH  9. Multiple myeloma in remission ([place]) Continue routine hem/onc follow up .   10. Abdominal aneurysm ([place]) Follow up with Dr. Delana Meyer scheduled in September.   11. Prostate cancer screening  - PSA  12. Thrombocytopenia (HCC) -CBC      The entirety of the information documented in the History of Present Illness, Review of Systems and Physical Exam were personally obtained by me. Portions of this information were initially documented by the CMA and reviewed by me for thoroughness and accuracy.      [name], MD  [facility] [phone] (phone) [phone] (fax)  [place]

## 2019-11-02 LAB — TSH: TSH: 3.98 u[IU]/mL (ref 0.450–4.500)

## 2019-11-02 LAB — COMPREHENSIVE METABOLIC PANEL
ALT: 14 IU/L (ref 0–44)
AST: 15 IU/L (ref 0–40)
Albumin/Globulin Ratio: 1.8 (ref 1.2–2.2)
Albumin: 3.6 g/dL — ABNORMAL LOW (ref 3.7–4.7)
Alkaline Phosphatase: 76 IU/L (ref 48–121)
BUN/Creatinine Ratio: 23 (ref 10–24)
BUN: 24 mg/dL (ref 8–27)
Bilirubin Total: 1.2 mg/dL (ref 0.0–1.2)
CO2: 26 mmol/L (ref 20–29)
Calcium: 9 mg/dL (ref 8.6–10.2)
Chloride: 103 mmol/L (ref 96–106)
Creatinine, Ser: 1.05 mg/dL (ref 0.76–1.27)
GFR calc Af Amer: 77 mL/min/{1.73_m2} (ref >59)
GFR calc non Af Amer: 67 mL/min/{1.73_m2} (ref >59)
Globulin, Total: 2 g/dL (ref 1.5–4.5)
Glucose: 103 mg/dL — ABNORMAL HIGH (ref 65–99)
Potassium: 4.6 mmol/L (ref 3.5–5.2)
Sodium: 139 mmol/L (ref 134–144)
Total Protein: 5.6 g/dL — ABNORMAL LOW (ref 6.0–8.5)

## 2019-11-02 LAB — PSA: Prostate Specific Ag, Serum: 0.3 ng/mL (ref 0.0–4.0)

## 2019-11-02 LAB — BRAIN NATRIURETIC PEPTIDE: BNP: 56.1 pg/mL (ref 0.0–100.0)

## 2019-11-02 LAB — CBC
Hematocrit: 41.8 % (ref 37.5–51.0)
Hemoglobin: 13.2 g/dL (ref 13.0–17.7)
MCH: 27.7 pg (ref 26.6–33.0)
MCHC: 31.6 g/dL (ref 31.5–35.7)
MCV: 88 fL (ref 79–97)
Platelets: 113 10*3/uL — ABNORMAL LOW (ref 150–450)
RBC: 4.77 x10E6/uL (ref 4.14–5.80)
RDW: 17 % — ABNORMAL HIGH (ref 11.6–15.4)
WBC: 9.4 10*3/uL (ref 3.4–10.8)

## 2019-11-02 LAB — HEMOGLOBIN A1C
Est. average glucose Bld gHb Est-mCnc: 154 mg/dL
Hgb A1c MFr Bld: 7 % — ABNORMAL HIGH (ref 4.8–5.6)

## 2019-11-03 ENCOUNTER — Encounter: Payer: Self-pay | Admitting: Family Medicine

## 2019-11-03 DIAGNOSIS — D696 Thrombocytopenia, unspecified: Secondary | ICD-10-CM | POA: Insufficient documentation

## 2019-12-13 ENCOUNTER — Other Ambulatory Visit (INDEPENDENT_AMBULATORY_CARE_PROVIDER_SITE_OTHER): Payer: Self-pay | Admitting: Nurse Practitioner

## 2019-12-13 DIAGNOSIS — I714 Abdominal aortic aneurysm, without rupture, unspecified: Secondary | ICD-10-CM

## 2019-12-16 ENCOUNTER — Other Ambulatory Visit: Payer: Self-pay

## 2019-12-16 ENCOUNTER — Ambulatory Visit (INDEPENDENT_AMBULATORY_CARE_PROVIDER_SITE_OTHER): Payer: PPO

## 2019-12-16 ENCOUNTER — Encounter (INDEPENDENT_AMBULATORY_CARE_PROVIDER_SITE_OTHER): Payer: Self-pay | Admitting: Vascular Surgery

## 2019-12-16 ENCOUNTER — Ambulatory Visit (INDEPENDENT_AMBULATORY_CARE_PROVIDER_SITE_OTHER): Payer: PPO | Admitting: Vascular Surgery

## 2019-12-16 VITALS — BP 142/82 | HR 63 | Ht 66.0 in | Wt 228.0 lb

## 2019-12-16 DIAGNOSIS — I714 Abdominal aortic aneurysm, without rupture, unspecified: Secondary | ICD-10-CM

## 2019-12-16 DIAGNOSIS — J449 Chronic obstructive pulmonary disease, unspecified: Secondary | ICD-10-CM

## 2019-12-16 DIAGNOSIS — I70213 Atherosclerosis of native arteries of extremities with intermittent claudication, bilateral legs: Secondary | ICD-10-CM

## 2019-12-16 DIAGNOSIS — K219 Gastro-esophageal reflux disease without esophagitis: Secondary | ICD-10-CM | POA: Diagnosis not present

## 2019-12-16 DIAGNOSIS — M1990 Unspecified osteoarthritis, unspecified site: Secondary | ICD-10-CM
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
## 2019-12-24 ENCOUNTER — Encounter (INDEPENDENT_AMBULATORY_CARE_PROVIDER_SITE_OTHER): Payer: Self-pay | Admitting: Vascular Surgery

## 2019-12-24 DIAGNOSIS — I70219 Atherosclerosis of native arteries of extremities with intermittent claudication, unspecified extremity: Secondary | ICD-10-CM | POA: Insufficient documentation

## 2020-01-06 ENCOUNTER — Other Ambulatory Visit: Payer: Self-pay

## 2020-01-06 ENCOUNTER — Ambulatory Visit (INDEPENDENT_AMBULATORY_CARE_PROVIDER_SITE_OTHER): Payer: PPO

## 2020-01-06 ENCOUNTER — Ambulatory Visit (INDEPENDENT_AMBULATORY_CARE_PROVIDER_SITE_OTHER): Payer: PPO | Admitting: Vascular Surgery

## 2020-01-06 ENCOUNTER — Encounter (INDEPENDENT_AMBULATORY_CARE_PROVIDER_SITE_OTHER): Payer: Self-pay | Admitting: Vascular Surgery

## 2020-01-06 VITALS — BP 135/78 | HR 80 | Resp 16 | Wt 227.6 lb

## 2020-01-06 DIAGNOSIS — M1990 Unspecified osteoarthritis, unspecified site: Secondary | ICD-10-CM | POA: Diagnosis not present

## 2020-01-06 DIAGNOSIS — J449 Chronic obstructive pulmonary disease, unspecified: Secondary | ICD-10-CM

## 2020-01-06 DIAGNOSIS — I70213 Atherosclerosis of native arteries of extremities with intermittent claudication, bilateral legs: Secondary | ICD-10-CM

## 2020-01-06 DIAGNOSIS — I714 Abdominal aortic aneurysm, without rupture, unspecified: Secondary | ICD-10-CM

## 2020-01-06 DIAGNOSIS — R252 Cramp and spasm: Secondary | ICD-10-CM | POA: Diagnosis not present

## 2020-01-07 ENCOUNTER — Encounter (INDEPENDENT_AMBULATORY_CARE_PROVIDER_SITE_OTHER): Payer: Self-pay | Admitting: Vascular Surgery

## 2020-01-07 DIAGNOSIS — R252 Cramp and spasm: Secondary | ICD-10-CM | POA: Insufficient documentation
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
## 2020-01-27 ENCOUNTER — Ambulatory Visit: Payer: PPO

## 2020-02-09 DIAGNOSIS — M12811 Other specific arthropathies, not elsewhere classified, right shoulder: Secondary | ICD-10-CM | POA: Insufficient documentation

## 2020-03-20 ENCOUNTER — Other Ambulatory Visit: Payer: Self-pay | Admitting: Family Medicine

## 2020-03-20 ENCOUNTER — Other Ambulatory Visit: Payer: Self-pay | Admitting: Surgery

## 2020-03-20 DIAGNOSIS — E039 Hypothyroidism, unspecified: Secondary | ICD-10-CM

## 2020-03-23 ENCOUNTER — Encounter: Payer: Self-pay | Admitting: Family Medicine

## 2020-03-27 ENCOUNTER — Other Ambulatory Visit: Payer: Self-pay

## 2020-03-27 ENCOUNTER — Encounter
Admission: RE | Admit: 2020-03-27 | Discharge: 2020-03-27 | Disposition: A | Discharge status: Home / Self Care | Payer: PPO | Source: Ambulatory Visit | Attending: Surgery | Admitting: Surgery

## 2020-03-27 DIAGNOSIS — D696 Thrombocytopenia, unspecified: Secondary | ICD-10-CM | POA: Diagnosis not present

## 2020-03-27 DIAGNOSIS — Z01818 Encounter for other preprocedural examination: Secondary | ICD-10-CM | POA: Insufficient documentation

## 2020-03-27 DIAGNOSIS — C9000 Multiple myeloma not having achieved remission: Secondary | ICD-10-CM | POA: Diagnosis not present

## 2020-03-27 HISTORY — DX: Gastro-esophageal reflux disease without esophagitis: K21.9

## 2020-03-27 HISTORY — DX: Type 2 diabetes mellitus without complications: E11.9

## 2020-03-27 HISTORY — DX: Personal history of other diseases of the digestive system: Z87.19

## 2020-03-27 HISTORY — DX: Personal history of urinary calculi: Z87.442

## 2020-03-27 LAB — COMPREHENSIVE METABOLIC PANEL
ALT: 10 U/L (ref 0–44)
AST: 18 U/L (ref 15–41)
Albumin: 3.6 g/dL (ref 3.5–5.0)
Alkaline Phosphatase: 82 U/L (ref 38–126)
Anion gap: 8 (ref 5–15)
BUN: 20 mg/dL (ref 8–23)
CO2: 27 mmol/L (ref 22–32)
Calcium: 8.7 mg/dL — ABNORMAL LOW (ref 8.9–10.3)
Chloride: 103 mmol/L (ref 98–111)
Creatinine, Ser: 1.04 mg/dL (ref 0.61–1.24)
GFR, Estimated: >60 mL/min (ref >60)
Glucose, Bld: 127 mg/dL — ABNORMAL HIGH (ref 70–99)
Potassium: 4.1 mmol/L (ref 3.5–5.1)
Sodium: 138 mmol/L (ref 135–145)
Total Bilirubin: 1 mg/dL (ref 0.3–1.2)
Total Protein: 6.5 g/dL (ref 6.5–8.1)

## 2020-03-27 LAB — URINALYSIS, ROUTINE W REFLEX MICROSCOPIC
Bilirubin Urine: NEGATIVE
Glucose, UA: NEGATIVE mg/dL
Hgb urine dipstick: NEGATIVE
Ketones, ur: NEGATIVE mg/dL
Leukocytes,Ua: NEGATIVE
Nitrite: NEGATIVE
Protein, ur: NEGATIVE mg/dL
Specific Gravity, Urine: 1.021 (ref 1.005–1.030)
pH: 5 (ref 5.0–8.0)

## 2020-03-27 LAB — CBC WITH DIFFERENTIAL/PLATELET
Abs Immature Granulocytes: 0.03 10*3/uL (ref 0.00–0.07)
Basophils Absolute: 0 10*3/uL (ref 0.0–0.1)
Basophils Relative: 1 %
Eosinophils Absolute: 0.1 10*3/uL (ref 0.0–0.5)
Eosinophils Relative: 1 %
HCT: 41.7 % (ref 39.0–52.0)
Hemoglobin: 12.8 g/dL — ABNORMAL LOW (ref 13.0–17.0)
Immature Granulocytes: 1 %
Lymphocytes Relative: 22 %
Lymphs Abs: 1.4 10*3/uL (ref 0.7–4.0)
MCH: 27.4 pg (ref 26.0–34.0)
MCHC: 30.7 g/dL (ref 30.0–36.0)
MCV: 89.1 fL (ref 80.0–100.0)
Monocytes Absolute: 0.4 10*3/uL (ref 0.1–1.0)
Monocytes Relative: 7 %
Neutro Abs: 4.2 10*3/uL (ref 1.7–7.7)
Neutrophils Relative %: 68 %
Platelets: 70 10*3/uL — ABNORMAL LOW (ref 150–400)
RBC: 4.68 MIL/uL (ref 4.22–5.81)
RDW: 14.8 % (ref 11.5–15.5)
WBC: 6.2 10*3/uL (ref 4.0–10.5)
nRBC: 0 % (ref 0.0–0.2)

## 2020-03-27 LAB — TYPE AND SCREEN
ABO/RH(D): O POS
Antibody Screen: NEGATIVE

## 2020-03-27 LAB — SURGICAL PCR SCREEN
MRSA, PCR: NEGATIVE
Staphylococcus aureus: POSITIVE — AB

## 2020-03-27 NOTE — Patient Instructions (Addendum)
Your procedure is scheduled on:04-04-20 TUESDAY Report to the Registration Desk on the 1st floor of the Medical Mall-Then proceed to the 2nd floor Surgery Desk in the Medical Mall To find out your arrival time, please call [phone] between 1PM - 3PM on:04-03-20 MONDAY  REMEMBER: Instructions that are not followed completely may result in serious medical risk, up to and including death; or upon the discretion of your surgeon and anesthesiologist your surgery may need to be rescheduled.  Do not eat food after midnight the night before surgery.  No gum chewing, lozengers or hard candies.  You may however, drink WATER up to 2 hours before you are scheduled to arrive for your surgery. Do not drink anything within 2 hours of your scheduled arrival time.  TYPE 1 AND 2 DIABETICS CAN ONLY DRINK WATER  TAKE THESE MEDICATIONS THE MORNING OF SURGERY WITH A SIP OF WATER: -SYNTHROID (LEVOTHYROXINE) -ATENOLOL (TENORMIN) -PRILOSEC (OMEPRAZOLE)-take one the night before and one on the morning of surgery - helps to prevent nausea after surgery.)  Use inhalers on the day of surgery and bring to the hospital-USE YOUR SPIRIVA AND ALBUTEROL INHALER THE MORNING OF SURGERY-BRING YOUR ALBUTEROL INHALER TO THE HOSPITAL  One week prior to surgery: Stop Anti-inflammatories (NSAIDS) such as Advil, Aleve, Ibuprofen, Motrin, Naproxen, Naprosyn and Aspirin based products such as Excedrin, Goodys Powder, BC Powder-OK TO TAKE TYLENOL/HYDROCODONE IF NEEDED  Stop ANY OVER THE COUNTER supplements until after surgery-STOP VITAMIN C, ASTAXANTHIN, AND QUERCETIN NOW-YOU MAY RESUME AFTER SURGERY (However, you may continue taking Calcium- Vitamin D up until the day before surgery.)  No Alcohol for 24 hours before or after surgery.  No Smoking including e-cigarettes for 24 hours prior to surgery.  No chewable tobacco products for at least 6 hours prior to surgery.  No nicotine patches on the day of surgery.  Do not use any  "recreational" drugs for at least a week prior to your surgery.  Please be advised that the combination of cocaine and anesthesia may have negative outcomes, up to and including death. If you test positive for cocaine, your surgery will be cancelled.  On the morning of surgery brush your teeth with toothpaste and water, you may rinse your mouth with mouthwash if you wish. Do not swallow any toothpaste or mouthwash.  Do not wear jewelry, make-up, hairpins, clips or nail polish.  Do not wear lotions, powders, or perfumes.   Do not shave body from the neck down 48 hours prior to surgery just in case you cut yourself which could leave a site for infection.  Also, freshly shaved skin may become irritated if using the CHG soap.  Contact lenses, hearing aids and dentures may not be worn into surgery.  Do not bring valuables to the hospital. [facility] is not responsible for any missing/lost belongings or valuables.   Use CHG Soap as directed on instruction sheet.  Total Shoulder Arthroplasty:  use Benzolyl Peroxide 5% Gel as directed on instruction sheet.  Notify your doctor if there is any change in your medical condition (cold, fever, infection).  Wear comfortable clothing (specific to your surgery type) to the hospital.  Plan for stool softeners for home use; pain medications have a tendency to cause constipation. You can also help prevent constipation by eating foods high in fiber such as fruits and vegetables and drinking plenty of fluids as your diet allows.  After surgery, you can help prevent lung complications by doing breathing exercises.  Take deep breaths and cough  every 1-2 hours. Your doctor may order a device called an Incentive Spirometer to help you take deep breaths. When coughing or sneezing, hold a pillow firmly against your incision with both hands. This is called "splinting." Doing this helps protect your incision. It also decreases belly discomfort.  If you are being  admitted to the hospital overnight, leave your suitcase in the car. After surgery it may be brought to your room.  If you are being discharged the day of surgery, you will not be allowed to drive home. You will need a responsible adult (18 years or older) to drive you home and stay with you that night.   If you are taking public transportation, you will need to have a responsible adult (18 years or older) with you. Please confirm with your physician that it is acceptable to use public transportation.   Please call the [facility] at [phone] if you have any questions about these instructions.  Visitation Policy:  Patients undergoing a surgery or procedure may have one family member or support person with them as long as that person is not COVID-19 positive or experiencing its symptoms.  That person may remain in the waiting area during the procedure.  Inpatient Visitation:    Visiting hours are 7 a.m. to 8 p.m. Patients will be allowed one visitor. The visitor may change daily. The visitor must pass COVID-19 screenings, use hand sanitizer when entering and exiting the patient's room and wear a mask at all times, including in the patient's room. Patients must also wear a mask when staff or their visitor are in the room. Masking is required regardless of vaccination status. Systemwide, no visitors 17 or younger.

## 2020-03-30 ENCOUNTER — Telehealth: Payer: Self-pay | Admitting: Family Medicine

## 2020-03-30 ENCOUNTER — Encounter: Payer: Self-pay | Admitting: Urgent Care

## 2020-03-30 ENCOUNTER — Other Ambulatory Visit: Payer: Self-pay | Admitting: Family Medicine

## 2020-03-30 DIAGNOSIS — D696 Thrombocytopenia, unspecified: Secondary | ICD-10-CM

## 2020-03-30 MED ORDER — ATENOLOL 25 MG PO TABS: 25.0000 mg | ORAL_TABLET | ORAL | 4 refills | Status: DC

## 2020-03-30 NOTE — Progress Notes (Signed)
  [facility] Perioperative Services: Pre-Admission/Anesthesia Testing  Abnormal Lab Notification   Date: 03/27/2020  Name: DAMONTAY ALRED MRN:   [id number]  Re: Abnormal labs noted during PAT appointment   Provider(s) Notified: Milagros Evener, MD Notification mode: Routed and/or faxed via [facility]   ABNORMAL LAB VALUE(S): Lab Results  Component Value Date   PLT 70 (L) 03/27/2020   Notes:  Patient is scheduled for a REVERSE SHOULDER ARTHROPLASTY (Right Shoulder) on 04/04/2020. Patient has a history of multiple myeloma (IgG lambda light chain specificity), which would account for the noted thrombocytopenia. Previously treated at [facility] in 2007-2008. SCT was discussed, however no heme/onc records available for immediate review beyond 07/2006. PCP's office indicates that patient has been seen by heme/onc at [facility], however again, those records are not immediately available for review. This is a Community education officer; no formal response is required.  Honor Loh, MSN, APRN, FNP-C, CEN [facility]  Peri-operative Services Nurse Practitioner Phone: [phone] Fax: [phone] 03/30/20 12:34 PM

## 2020-03-30 NOTE — Telephone Encounter (Signed)
Can you let Ryan Ali that Dr. Joice Lofts called due to his platelet count being too low for surgery. Platelets are 70 and need to be above 100 for surgery.  We'd like to get him in to see a hematologist to work on this. I think he sees a hematologist at the [place] but it would probably be quicker to see one in [place] if he doesn't mind [place] sending in a referral.   Also recommend getting an abdominal ultrasound to rule out liver and spleen disease which can cause low platelet counts.

## 2020-03-31 ENCOUNTER — Other Ambulatory Visit: Admission: RE | Admit: 2020-03-31 | Payer: PPO | Source: Ambulatory Visit

## 2020-03-31 ENCOUNTER — Other Ambulatory Visit
Admission: RE | Admit: 2020-03-31 | Discharge: 2020-03-31 | Disposition: A | Discharge status: Home / Self Care | Payer: PPO | Source: Ambulatory Visit | Attending: Surgery | Admitting: Surgery

## 2020-03-31 ENCOUNTER — Other Ambulatory Visit: Payer: Self-pay

## 2020-03-31 DIAGNOSIS — Z01810 Encounter for preprocedural cardiovascular examination: Secondary | ICD-10-CM | POA: Diagnosis not present

## 2020-03-31 DIAGNOSIS — Z01812 Encounter for preprocedural laboratory examination: Secondary | ICD-10-CM | POA: Insufficient documentation

## 2020-03-31 DIAGNOSIS — Z20822 Contact with and (suspected) exposure to covid-19: Secondary | ICD-10-CM | POA: Diagnosis not present

## 2020-03-31 LAB — SARS CORONAVIRUS 2 (TAT 6-24 HRS): SARS Coronavirus 2: NEGATIVE

## 2020-03-31 NOTE — Telephone Encounter (Signed)
Advised patient's wife as below. She reports that patient is willing to go to the hematologist in [place]. She also mentions that the patient had a AAA ultrasound about 3 months ago. Would this be sufficient or does he need another ultrasound? Please advise. Thanks!

## 2020-04-03 NOTE — Addendum Note (Signed)
Addended by: Birdie Sons on: 04/03/2020 04:46 PM   Modules accepted: Orders

## 2020-04-03 NOTE — Telephone Encounter (Signed)
Aortic ultrasounds do not look at spleen or liver which we need to see for evaluation of low platelets. Have placed order for hematology referral and ultrasound

## 2020-04-03 NOTE — Telephone Encounter (Signed)
Spoke with wife and advised as below she states that patient actually has a hematologist that he sees at the [place] and has an appt scheduled for 04/05/20. KW

## 2020-04-03 NOTE — Telephone Encounter (Signed)
Patients wife advised and verbalized understanding.  

## 2020-04-03 NOTE — Telephone Encounter (Signed)
OK, have cancelled referral, but she needs to the hematologist know that Dr. Roland Rack needs platelets up to 100K before he can do surgery.

## 2020-04-04 ENCOUNTER — Encounter: Admission: RE | Payer: Self-pay | Source: Home / Self Care

## 2020-04-04 ENCOUNTER — Inpatient Hospital Stay
Admission: RE | Admit: 2020-04-04 | Payer: No Typology Code available for payment source | Source: Home / Self Care | Admitting: Surgery

## 2020-04-04 SURGERY — ARTHROPLASTY, SHOULDER, TOTAL, REVERSE
Anesthesia: Choice | Site: Shoulder | Laterality: Right

## 2020-05-05 ENCOUNTER — Telehealth: Payer: Self-pay | Admitting: *Deleted

## 2020-05-05 NOTE — Telephone Encounter (Signed)
Copied from [place] [phone]. Topic: General - Call Back - No Documentation >> May 05, 2020  2:42 PM Erick Blinks wrote: Reason for CRM: Pt's wife called reporting that the patient has dropped a heavy object on his foot. She would like a call back from the office for assistance with scheduling an Xray please advise    Best contact: [phone]

## 2020-05-05 NOTE — Telephone Encounter (Signed)
LMTCB 05/05/2020.  PEC please advise pt he needs an office visit.  If we don't have anything available this afternoon he may need to be seen at an Urgent Care.   Thanks,   -Mickel Baas

## 2020-05-06 DIAGNOSIS — S9032XA Contusion of left foot, initial encounter: Secondary | ICD-10-CM | POA: Diagnosis not present

## 2020-05-06 DIAGNOSIS — S92325A Nondisplaced fracture of second metatarsal bone, left foot, initial encounter for closed fracture: Secondary | ICD-10-CM | POA: Diagnosis not present

## 2020-05-06 DIAGNOSIS — M7989 Other specified soft tissue disorders: Secondary | ICD-10-CM | POA: Diagnosis not present

## 2020-05-06 DIAGNOSIS — S99922A Unspecified injury of left foot, initial encounter: Secondary | ICD-10-CM | POA: Diagnosis not present

## 2020-05-08 ENCOUNTER — Ambulatory Visit: Payer: PPO | Admitting: Physician Assistant

## 2020-05-08 NOTE — Telephone Encounter (Signed)
It looks like patient had an appointment scheduled today with Carles Collet, PA-C,  but called and cancelled appointment

## 2020-05-11 DIAGNOSIS — S92325A Nondisplaced fracture of second metatarsal bone, left foot, initial encounter for closed fracture: Secondary | ICD-10-CM | POA: Diagnosis not present

## 2020-05-16 ENCOUNTER — Ambulatory Visit: Payer: PPO | Admitting: Family Medicine

## 2020-05-18 ENCOUNTER — Other Ambulatory Visit: Payer: Self-pay | Admitting: Surgery

## 2020-05-25 ENCOUNTER — Encounter
Admission: RE | Admit: 2020-05-25 | Discharge: 2020-05-25 | Disposition: A | Discharge status: Home / Self Care | Payer: PPO | Source: Ambulatory Visit | Attending: Surgery | Admitting: Surgery

## 2020-05-25 ENCOUNTER — Other Ambulatory Visit: Payer: Self-pay

## 2020-05-25 DIAGNOSIS — Z01812 Encounter for preprocedural laboratory examination: Secondary | ICD-10-CM | POA: Insufficient documentation

## 2020-05-25 HISTORY — DX: Pneumonia, unspecified organism: J18.9

## 2020-05-25 HISTORY — DX: Prediabetes: R73.03

## 2020-05-25 HISTORY — DX: Dyspnea, unspecified: R06.00

## 2020-05-25 LAB — TYPE AND SCREEN
ABO/RH(D): O POS
Antibody Screen: NEGATIVE

## 2020-05-25 LAB — URINALYSIS, ROUTINE W REFLEX MICROSCOPIC
Bilirubin Urine: NEGATIVE
Glucose, UA: NEGATIVE mg/dL
Hgb urine dipstick: NEGATIVE
Ketones, ur: NEGATIVE mg/dL
Leukocytes,Ua: NEGATIVE
Nitrite: NEGATIVE
Protein, ur: NEGATIVE mg/dL
Specific Gravity, Urine: 1.026 (ref 1.005–1.030)
pH: 5 (ref 5.0–8.0)

## 2020-05-25 LAB — COMPREHENSIVE METABOLIC PANEL
ALT: 9 U/L (ref 0–44)
AST: 17 U/L (ref 15–41)
Albumin: 3.8 g/dL (ref 3.5–5.0)
Alkaline Phosphatase: 89 U/L (ref 38–126)
Anion gap: 6 (ref 5–15)
BUN: 22 mg/dL (ref 8–23)
CO2: 26 mmol/L (ref 22–32)
Calcium: 8.9 mg/dL (ref 8.9–10.3)
Chloride: 107 mmol/L (ref 98–111)
Creatinine, Ser: 1.13 mg/dL (ref 0.61–1.24)
GFR, Estimated: >60 mL/min (ref >60)
Glucose, Bld: 202 mg/dL — ABNORMAL HIGH (ref 70–99)
Potassium: 4.4 mmol/L (ref 3.5–5.1)
Sodium: 139 mmol/L (ref 135–145)
Total Bilirubin: 1 mg/dL (ref 0.3–1.2)
Total Protein: 6.6 g/dL (ref 6.5–8.1)

## 2020-05-25 LAB — CBC WITH DIFFERENTIAL/PLATELET
Abs Immature Granulocytes: 0.03 10*3/uL (ref 0.00–0.07)
Basophils Absolute: 0 10*3/uL (ref 0.0–0.1)
Basophils Relative: 1 %
Eosinophils Absolute: 0.1 10*3/uL (ref 0.0–0.5)
Eosinophils Relative: 2 %
HCT: 45.6 % (ref 39.0–52.0)
Hemoglobin: 14.4 g/dL (ref 13.0–17.0)
Immature Granulocytes: 0 %
Lymphocytes Relative: 24 %
Lymphs Abs: 1.7 10*3/uL (ref 0.7–4.0)
MCH: 29 pg (ref 26.0–34.0)
MCHC: 31.6 g/dL (ref 30.0–36.0)
MCV: 91.8 fL (ref 80.0–100.0)
Monocytes Absolute: 0.6 10*3/uL (ref 0.1–1.0)
Monocytes Relative: 8 %
Neutro Abs: 4.5 10*3/uL (ref 1.7–7.7)
Neutrophils Relative %: 65 %
Platelets: 151 10*3/uL (ref 150–400)
RBC: 4.97 MIL/uL (ref 4.22–5.81)
RDW: 17 % — ABNORMAL HIGH (ref 11.5–15.5)
WBC: 7 10*3/uL (ref 4.0–10.5)
nRBC: 0 % (ref 0.0–0.2)

## 2020-05-25 LAB — PROTIME-INR
INR: 1 (ref 0.8–1.2)
Prothrombin Time: 12.9 seconds (ref 11.4–15.2)

## 2020-05-25 LAB — APTT: aPTT: 27 seconds (ref 24–36)

## 2020-05-25 NOTE — Patient Instructions (Addendum)
Your procedure is scheduled on: 06/01/2020 Thursday Report to the Registration Desk on the 1st floor of the [facility]. To find out your arrival time, please call [phone] between 1PM - 3PM on: 05/31/2020 - Wednesday  REMEMBER: Instructions that are not followed completely may result in serious medical risk, up to and including death; or upon the discretion of your surgeon and anesthesiologist your surgery may need to be rescheduled.  Do not eat food after midnight the night before surgery.  No gum chewing, lozengers or hard candies.  You may however, drink CLEAR liquids up to 2 hours before you are scheduled to arrive for your surgery. Do not drink anything within 2 hours of your scheduled arrival time.  Clear liquids include: - water  - apple juice without pulp - gatorade (not RED, PURPLE, OR BLUE) - black coffee or tea (Do NOT add milk or creamers to the coffee or tea) Do NOT drink anything that is not on this list.  Type 1 and Type 2 diabetics should only drink water.  In addition, your doctor has ordered for you to drink the provided  Ensure Pre-Surgery Clear Carbohydrate Drink  Drinking this carbohydrate drink up to two hours before surgery helps to reduce insulin resistance and improve patient outcomes. Please complete drinking 2 hours prior to scheduled arrival time.  TAKE THESE MEDICATIONS THE MORNING OF SURGERY WITH A SIP OF WATER:  - atenolol (TENORMIN) 25 MG tablet - brimonidine (ALPHAGAN) 0.2 % ophthalmic solution - budesonide-formoterol (SYMBICORT) 160-4.5 MCG/ACT inhaler - Fluticasone-Salmeterol (ADVAIR) 250-50 MCG/DOSE AEPB - levothyroxine (SYNTHROID) 150 MCG tablet - omeprazole (PRILOSEC) 20 MG capsule, take one the night before and one on the morning of surgery - helps to prevent nausea after surgery. - tiotropium (SPIRIVA) 18 MCG inhalation capsule - HYDROcodone-acetaminophen (NORCO/VICODIN) 5-325 MG tablet  Use inhaler albuterol (VENTOLIN HFA) 108 (90  Base) MCG/ACT inhaler on the day of surgery and bring to the hospital.  One week prior to surgery: Stop Anti-inflammatories (NSAIDS) such as Advil, Aleve, Ibuprofen, Motrin, Naproxen, Naprosyn and Aspirin based products such as Excedrin, Goodys Powder, BC Powder.  Stop ANY OVER THE COUNTER supplements until after surgery.  No Alcohol for 24 hours before or after surgery.  No Smoking including e-cigarettes for 24 hours prior to surgery.  No chewable tobacco products for at least 6 hours prior to surgery.  No nicotine patches on the day of surgery.  Do not use any "recreational" drugs for at least a week prior to your surgery.  Please be advised that the combination of cocaine and anesthesia may have negative outcomes, up to and including death. If you test positive for cocaine, your surgery will be cancelled.  On the morning of surgery brush your teeth with toothpaste and water, you may rinse your mouth with mouthwash if you wish. Do not swallow any toothpaste or mouthwash.  Do not wear jewelry, make-up, hairpins, clips or nail polish.  Do not wear lotions, powders, or perfumes.   Do not shave body from the neck down 48 hours prior to surgery just in case you cut yourself which could leave a site for infection.  Also, freshly shaved skin may become irritated if using the CHG soap.  Contact lenses, hearing aids and dentures may not be worn into surgery.  Do not bring valuables to the hospital. [facility] is not responsible for any missing/lost belongings or valuables.   Use CHG Soap or wipes as directed on instruction sheet.  Total Shoulder Arthroplasty:  use Benzolyl Peroxide 5% Gel as directed on instruction sheet.   Notify your doctor if there is any change in your medical condition (cold, fever, infection).  Wear comfortable clothing (specific to your surgery type) to the hospital.  Plan for stool softeners for home use; pain medications have a tendency to cause  constipation. You can also help prevent constipation by eating foods high in fiber such as fruits and vegetables and drinking plenty of fluids as your diet allows.  After surgery, you can help prevent lung complications by doing breathing exercises.  Take deep breaths and cough every 1-2 hours. Your doctor may order a device called an Incentive Spirometer to help you take deep breaths. When coughing or sneezing, hold a pillow firmly against your incision with both hands. This is called "splinting." Doing this helps protect your incision. It also decreases belly discomfort.  If you are being admitted to the hospital overnight, leave your suitcase in the car. After surgery it may be brought to your room.  If you are being discharged the day of surgery, you will not be allowed to drive home. You will need a responsible adult (18 years or older) to drive you home and stay with you that night.   If you are taking public transportation, you will need to have a responsible adult (18 years or older) with you. Please confirm with your physician that it is acceptable to use public transportation.   Please call the [facility]. at [phone] if you have any questions about these instructions.  Surgery Visitation Policy:  Patients undergoing a surgery or procedure may have one family member or support person with them as long as that person is not COVID-19 positive or experiencing its symptoms.  That person may remain in the waiting area during the procedure.  Inpatient Visitation:    Visiting hours are 7 a.m. to 8 p.m. Inpatients will be allowed two visitors daily. The visitors may change each day during the patient's stay. No visitors under the age of 33. Any visitor under the age of 31 must be accompanied by an adult. The visitor must pass COVID-19 screenings, use hand sanitizer when entering and exiting the patient's room and wear a mask at all times, including in the  patient's room. Patients must also wear a mask when staff or their visitor are in the room. Masking is required regardless of vaccination status.

## 2020-05-30 ENCOUNTER — Other Ambulatory Visit
Admission: RE | Admit: 2020-05-30 | Discharge: 2020-05-30 | Disposition: A | Discharge status: Home / Self Care | Payer: PPO | Source: Ambulatory Visit | Attending: Surgery | Admitting: Surgery

## 2020-05-30 ENCOUNTER — Other Ambulatory Visit: Payer: Self-pay

## 2020-05-30 DIAGNOSIS — Z20822 Contact with and (suspected) exposure to covid-19: Secondary | ICD-10-CM | POA: Insufficient documentation

## 2020-05-30 DIAGNOSIS — Z01812 Encounter for preprocedural laboratory examination: Secondary | ICD-10-CM | POA: Insufficient documentation

## 2020-05-31 LAB — SARS CORONAVIRUS 2 (TAT 6-24 HRS): SARS Coronavirus 2: NEGATIVE

## 2020-06-01 ENCOUNTER — Inpatient Hospital Stay: Payer: No Typology Code available for payment source | Admitting: Certified Registered Nurse Anesthetist

## 2020-06-01 ENCOUNTER — Encounter
Admission: RE | Disposition: A | Discharge status: Home Health | Payer: Self-pay | Source: Home / Self Care | Attending: Surgery

## 2020-06-01 ENCOUNTER — Other Ambulatory Visit: Payer: Self-pay

## 2020-06-01 ENCOUNTER — Inpatient Hospital Stay
Admission: RE | Admit: 2020-06-01 | Discharge: 2020-06-03 | DRG: 483 | Disposition: A | Discharge status: Home Health | Payer: No Typology Code available for payment source | Attending: Surgery | Admitting: Surgery

## 2020-06-01 ENCOUNTER — Encounter: Payer: Self-pay | Admitting: Surgery

## 2020-06-01 ENCOUNTER — Inpatient Hospital Stay: Payer: No Typology Code available for payment source

## 2020-06-01 DIAGNOSIS — M19011 Primary osteoarthritis, right shoulder: Secondary | ICD-10-CM | POA: Diagnosis present

## 2020-06-01 DIAGNOSIS — M75102 Unspecified rotator cuff tear or rupture of left shoulder, not specified as traumatic: Secondary | ICD-10-CM | POA: Diagnosis not present

## 2020-06-01 DIAGNOSIS — Z9481 Bone marrow transplant status: Secondary | ICD-10-CM | POA: Diagnosis not present

## 2020-06-01 DIAGNOSIS — Z7951 Long term (current) use of inhaled steroids: Secondary | ICD-10-CM | POA: Diagnosis not present

## 2020-06-01 DIAGNOSIS — R7303 Prediabetes: Secondary | ICD-10-CM | POA: Diagnosis present

## 2020-06-01 DIAGNOSIS — Z8701 Personal history of pneumonia (recurrent): Secondary | ICD-10-CM | POA: Diagnosis not present

## 2020-06-01 DIAGNOSIS — Z79899 Other long term (current) drug therapy: Secondary | ICD-10-CM | POA: Diagnosis not present

## 2020-06-01 DIAGNOSIS — K219 Gastro-esophageal reflux disease without esophagitis: Secondary | ICD-10-CM | POA: Diagnosis present

## 2020-06-01 DIAGNOSIS — K449 Diaphragmatic hernia without obstruction or gangrene: Secondary | ICD-10-CM | POA: Diagnosis present

## 2020-06-01 DIAGNOSIS — J449 Chronic obstructive pulmonary disease, unspecified: Secondary | ICD-10-CM | POA: Diagnosis present

## 2020-06-01 DIAGNOSIS — M12811 Other specific arthropathies, not elsewhere classified, right shoulder: Secondary | ICD-10-CM | POA: Diagnosis not present

## 2020-06-01 DIAGNOSIS — I714 Abdominal aortic aneurysm, without rupture: Secondary | ICD-10-CM | POA: Diagnosis present

## 2020-06-01 DIAGNOSIS — M75121 Complete rotator cuff tear or rupture of right shoulder, not specified as traumatic: Secondary | ICD-10-CM | POA: Diagnosis not present

## 2020-06-01 DIAGNOSIS — E890 Postprocedural hypothyroidism: Secondary | ICD-10-CM | POA: Diagnosis present

## 2020-06-01 DIAGNOSIS — C9000 Multiple myeloma not having achieved remission: Secondary | ICD-10-CM | POA: Diagnosis present

## 2020-06-01 DIAGNOSIS — Z87891 Personal history of nicotine dependence: Secondary | ICD-10-CM | POA: Diagnosis not present

## 2020-06-01 DIAGNOSIS — J189 Pneumonia, unspecified organism: Secondary | ICD-10-CM | POA: Diagnosis not present

## 2020-06-01 DIAGNOSIS — E119 Type 2 diabetes mellitus without complications: Secondary | ICD-10-CM | POA: Diagnosis not present

## 2020-06-01 DIAGNOSIS — M129 Arthropathy, unspecified: Secondary | ICD-10-CM | POA: Diagnosis not present

## 2020-06-01 DIAGNOSIS — Z86711 Personal history of pulmonary embolism: Secondary | ICD-10-CM | POA: Diagnosis not present

## 2020-06-01 DIAGNOSIS — R2681 Unsteadiness on feet: Secondary | ICD-10-CM | POA: Diagnosis not present

## 2020-06-01 DIAGNOSIS — J440 Chronic obstructive pulmonary disease with acute lower respiratory infection: Secondary | ICD-10-CM | POA: Diagnosis not present

## 2020-06-01 DIAGNOSIS — Z87442 Personal history of urinary calculi: Secondary | ICD-10-CM

## 2020-06-01 DIAGNOSIS — M75101 Unspecified rotator cuff tear or rupture of right shoulder, not specified as traumatic: Principal | ICD-10-CM | POA: Diagnosis present

## 2020-06-01 DIAGNOSIS — H409 Unspecified glaucoma: Secondary | ICD-10-CM | POA: Diagnosis present

## 2020-06-01 DIAGNOSIS — Z471 Aftercare following joint replacement surgery: Secondary | ICD-10-CM | POA: Diagnosis not present

## 2020-06-01 DIAGNOSIS — Z7982 Long term (current) use of aspirin: Secondary | ICD-10-CM

## 2020-06-01 DIAGNOSIS — M47812 Spondylosis without myelopathy or radiculopathy, cervical region: Secondary | ICD-10-CM | POA: Diagnosis present

## 2020-06-01 DIAGNOSIS — Z96611 Presence of right artificial shoulder joint: Secondary | ICD-10-CM

## 2020-06-01 HISTORY — PX: REVERSE SHOULDER ARTHROPLASTY: SHX5054

## 2020-06-01 LAB — GLUCOSE, CAPILLARY: Glucose-Capillary: 130 mg/dL — ABNORMAL HIGH (ref 70–99)

## 2020-06-01 SURGERY — ARTHROPLASTY, SHOULDER, TOTAL, REVERSE
Anesthesia: General | Site: Shoulder | Laterality: Right

## 2020-06-01 MED ORDER — SODIUM CHLORIDE FLUSH 0.9 % IV SOLN
INTRAVENOUS | Status: AC
  Filled 2020-06-01: qty 10

## 2020-06-01 MED ORDER — FENTANYL CITRATE (PF) 100 MCG/2ML IJ SOLN
INTRAMUSCULAR | Status: AC
  Administered 2020-06-01: 25 ug via INTRAVENOUS
  Filled 2020-06-01: qty 2

## 2020-06-01 MED ORDER — KETOROLAC TROMETHAMINE 15 MG/ML IJ SOLN
15.0000 mg | Freq: Once | INTRAMUSCULAR | Status: AC
  Administered 2020-06-01: 15 mg via INTRAVENOUS

## 2020-06-01 MED ORDER — ONDANSETRON HCL 4 MG/2ML IJ SOLN: 4.0000 mg | Freq: Four times a day (QID) | INTRAMUSCULAR | Status: DC | PRN

## 2020-06-01 MED ORDER — TRANEXAMIC ACID 1000 MG/10ML IV SOLN
INTRAVENOUS | Status: AC
  Filled 2020-06-01: qty 10

## 2020-06-01 MED ORDER — SUGAMMADEX SODIUM 500 MG/5ML IV SOLN
INTRAVENOUS | Status: DC | PRN
  Administered 2020-06-01: 250 mg via INTRAVENOUS

## 2020-06-01 MED ORDER — FENTANYL CITRATE (PF) 100 MCG/2ML IJ SOLN
INTRAMUSCULAR | Status: AC
  Filled 2020-06-01: qty 2

## 2020-06-01 MED ORDER — PHENYLEPHRINE HCL (PRESSORS) 10 MG/ML IV SOLN
INTRAVENOUS | Status: AC
  Filled 2020-06-01: qty 1

## 2020-06-01 MED ORDER — ALBUTEROL SULFATE HFA 108 (90 BASE) MCG/ACT IN AERS
INHALATION_SPRAY | RESPIRATORY_TRACT | Status: DC | PRN
  Administered 2020-06-01: 6 via RESPIRATORY_TRACT

## 2020-06-01 MED ORDER — ACETAMINOPHEN 500 MG PO TABS
ORAL_TABLET | ORAL | Status: AC
  Administered 2020-06-01: 1000 mg via ORAL
  Filled 2020-06-01: qty 2

## 2020-06-01 MED ORDER — SODIUM CHLORIDE 0.9 % IV SOLN: INTRAVENOUS | Status: DC

## 2020-06-01 MED ORDER — CEFAZOLIN SODIUM-DEXTROSE 2-4 GM/100ML-% IV SOLN
INTRAVENOUS | Status: AC
  Administered 2020-06-01: 2 g via INTRAVENOUS
  Filled 2020-06-01: qty 100

## 2020-06-01 MED ORDER — BUPIVACAINE-EPINEPHRINE (PF) 0.5% -1:200000 IJ SOLN
INTRAMUSCULAR | Status: AC
  Filled 2020-06-01: qty 90

## 2020-06-01 MED ORDER — ACETAMINOPHEN 10 MG/ML IV SOLN
INTRAVENOUS | Status: DC | PRN
  Administered 2020-06-01: 1000 mg via INTRAVENOUS

## 2020-06-01 MED ORDER — SODIUM CHLORIDE 0.9 % IV SOLN
INTRAVENOUS | Status: DC | PRN
  Administered 2020-06-01: 30 mL

## 2020-06-01 MED ORDER — ACETAMINOPHEN 10 MG/ML IV SOLN
INTRAVENOUS | Status: AC
  Filled 2020-06-01: qty 100

## 2020-06-01 MED ORDER — GLYCOPYRROLATE 0.2 MG/ML IJ SOLN
INTRAMUSCULAR | Status: AC
  Filled 2020-06-01: qty 1

## 2020-06-01 MED ORDER — LIDOCAINE HCL (PF) 2 % IJ SOLN
INTRAMUSCULAR | Status: AC
  Filled 2020-06-01: qty 5

## 2020-06-01 MED ORDER — EPINEPHRINE PF 1 MG/ML IJ SOLN
INTRAMUSCULAR | Status: AC
  Filled 2020-06-01: qty 1

## 2020-06-01 MED ORDER — IPRATROPIUM-ALBUTEROL 0.5-2.5 (3) MG/3ML IN SOLN
RESPIRATORY_TRACT | Status: AC
  Filled 2020-06-01: qty 3

## 2020-06-01 MED ORDER — LIDOCAINE HCL (CARDIAC) PF 100 MG/5ML IV SOSY
PREFILLED_SYRINGE | INTRAVENOUS | Status: DC | PRN
  Administered 2020-06-01: 100 mg via INTRAVENOUS

## 2020-06-01 MED ORDER — CHLORHEXIDINE GLUCONATE 0.12 % MT SOLN: 15.0000 mL | Freq: Once | OROMUCOSAL | Status: AC

## 2020-06-01 MED ORDER — ORAL CARE MOUTH RINSE: 15.0000 mL | Freq: Once | OROMUCOSAL | Status: AC

## 2020-06-01 MED ORDER — OXYCODONE HCL 5 MG PO TABS: 5.0000 mg | ORAL_TABLET | ORAL | 0 refills | Status: DC | PRN

## 2020-06-01 MED ORDER — SUCCINYLCHOLINE CHLORIDE 200 MG/10ML IV SOSY
PREFILLED_SYRINGE | INTRAVENOUS | Status: AC
  Filled 2020-06-01: qty 10

## 2020-06-01 MED ORDER — KETOROLAC TROMETHAMINE 15 MG/ML IJ SOLN
INTRAMUSCULAR | Status: AC
  Filled 2020-06-01: qty 1

## 2020-06-01 MED ORDER — METOCLOPRAMIDE HCL 10 MG PO TABS: 5.0000 mg | ORAL_TABLET | Freq: Three times a day (TID) | ORAL | Status: DC | PRN

## 2020-06-01 MED ORDER — OXYCODONE HCL 5 MG PO TABS
ORAL_TABLET | ORAL | Status: AC
  Administered 2020-06-01: 10 mg via ORAL
  Filled 2020-06-01: qty 2

## 2020-06-01 MED ORDER — KETAMINE HCL 10 MG/ML IJ SOLN
INTRAMUSCULAR | Status: DC | PRN
  Administered 2020-06-01: 20 mg via INTRAVENOUS

## 2020-06-01 MED ORDER — ROCURONIUM BROMIDE 10 MG/ML (PF) SYRINGE
PREFILLED_SYRINGE | INTRAVENOUS | Status: AC
  Filled 2020-06-01: qty 10

## 2020-06-01 MED ORDER — ROCURONIUM BROMIDE 100 MG/10ML IV SOLN
INTRAVENOUS | Status: DC | PRN
  Administered 2020-06-01: 40 mg via INTRAVENOUS
  Administered 2020-06-01: 10 mg via INTRAVENOUS
  Administered 2020-06-01: 20 mg via INTRAVENOUS

## 2020-06-01 MED ORDER — IPRATROPIUM-ALBUTEROL 0.5-2.5 (3) MG/3ML IN SOLN
3.0000 mL | Freq: Once | RESPIRATORY_TRACT | Status: AC
  Administered 2020-06-01: 3 mL via RESPIRATORY_TRACT

## 2020-06-01 MED ORDER — KETAMINE HCL 50 MG/5ML IJ SOSY
PREFILLED_SYRINGE | INTRAMUSCULAR | Status: AC
  Filled 2020-06-01: qty 5

## 2020-06-01 MED ORDER — SUCCINYLCHOLINE CHLORIDE 20 MG/ML IJ SOLN
INTRAMUSCULAR | Status: DC | PRN
  Administered 2020-06-01: 120 mg via INTRAVENOUS

## 2020-06-01 MED ORDER — METOCLOPRAMIDE HCL 5 MG/ML IJ SOLN
5.0000 mg | Freq: Three times a day (TID) | INTRAMUSCULAR | Status: DC | PRN
Start: 2020-06-01 — End: 2020-06-03

## 2020-06-01 MED ORDER — CEFAZOLIN SODIUM-DEXTROSE 2-4 GM/100ML-% IV SOLN
INTRAVENOUS | Status: AC
  Filled 2020-06-01: qty 100

## 2020-06-01 MED ORDER — BUPIVACAINE-EPINEPHRINE (PF) 0.5% -1:200000 IJ SOLN
INTRAMUSCULAR | Status: DC | PRN
  Administered 2020-06-01: 30 mL

## 2020-06-01 MED ORDER — GLYCOPYRROLATE 0.2 MG/ML IJ SOLN
INTRAMUSCULAR | Status: DC | PRN
  Administered 2020-06-01: .2 mg via INTRAVENOUS

## 2020-06-01 MED ORDER — PROPOFOL 10 MG/ML IV BOLUS
INTRAVENOUS | Status: DC | PRN
  Administered 2020-06-01: 20 mg via INTRAVENOUS
  Administered 2020-06-01: 90 mg via INTRAVENOUS

## 2020-06-01 MED ORDER — FENTANYL CITRATE (PF) 100 MCG/2ML IJ SOLN
INTRAMUSCULAR | Status: DC | PRN
  Administered 2020-06-01 (×2): 25 ug via INTRAVENOUS
  Administered 2020-06-01: 50 ug via INTRAVENOUS

## 2020-06-01 MED ORDER — CEFAZOLIN SODIUM-DEXTROSE 2-4 GM/100ML-% IV SOLN
2.0000 g | INTRAVENOUS | Status: AC
  Administered 2020-06-01: 2 g via INTRAVENOUS

## 2020-06-01 MED ORDER — LIDOCAINE HCL (PF) 2 % IJ SOLN
INTRAMUSCULAR | Status: AC
  Filled 2020-06-01: qty 10

## 2020-06-01 MED ORDER — SODIUM CHLORIDE 0.9 % IV SOLN
INTRAVENOUS | Status: DC | PRN
  Administered 2020-06-01: 10 ug/min via INTRAVENOUS

## 2020-06-01 MED ORDER — PROPOFOL 10 MG/ML IV BOLUS
INTRAVENOUS | Status: AC
  Filled 2020-06-01: qty 20

## 2020-06-01 MED ORDER — ONDANSETRON HCL 4 MG/2ML IJ SOLN
INTRAMUSCULAR | Status: DC | PRN
  Administered 2020-06-01: 4 mg via INTRAVENOUS

## 2020-06-01 MED ORDER — ONDANSETRON HCL 4 MG PO TABS: 4.0000 mg | ORAL_TABLET | Freq: Four times a day (QID) | ORAL | Status: DC | PRN

## 2020-06-01 MED ORDER — OXYCODONE HCL 5 MG PO TABS
5.0000 mg | ORAL_TABLET | ORAL | Status: DC | PRN
  Administered 2020-06-01: 5 mg via ORAL
  Administered 2020-06-02 (×4): 10 mg via ORAL
  Filled 2020-06-01 (×2): qty 2
  Filled 2020-06-01: qty 1
  Filled 2020-06-01 (×2): qty 2

## 2020-06-01 MED ORDER — TRANEXAMIC ACID 1000 MG/10ML IV SOLN
INTRAVENOUS | Status: DC | PRN
  Administered 2020-06-01: 1000 mg via TOPICAL

## 2020-06-01 MED ORDER — ACETAMINOPHEN 500 MG PO TABS
1000.0000 mg | ORAL_TABLET | Freq: Four times a day (QID) | ORAL | Status: AC
  Administered 2020-06-01 (×2): 1000 mg via ORAL
  Filled 2020-06-01 (×3): qty 2

## 2020-06-01 MED ORDER — CHLORHEXIDINE GLUCONATE 0.12 % MT SOLN
OROMUCOSAL | Status: AC
  Administered 2020-06-01: 15 mL via OROMUCOSAL
  Filled 2020-06-01: qty 15

## 2020-06-01 MED ORDER — ONDANSETRON HCL 4 MG/2ML IJ SOLN: 4.0000 mg | Freq: Once | INTRAMUSCULAR | Status: DC | PRN

## 2020-06-01 MED ORDER — BUPIVACAINE LIPOSOME 1.3 % IJ SUSP
INTRAMUSCULAR | Status: AC
  Filled 2020-06-01: qty 20

## 2020-06-01 MED ORDER — LACTATED RINGERS IV SOLN: INTRAVENOUS | Status: DC

## 2020-06-01 MED ORDER — DEXAMETHASONE SODIUM PHOSPHATE 10 MG/ML IJ SOLN
INTRAMUSCULAR | Status: DC | PRN
  Administered 2020-06-01: 10 mg via INTRAVENOUS

## 2020-06-01 MED ORDER — DEXAMETHASONE SODIUM PHOSPHATE 10 MG/ML IJ SOLN
INTRAMUSCULAR | Status: AC
  Filled 2020-06-01: qty 1

## 2020-06-01 MED ORDER — CEFAZOLIN SODIUM-DEXTROSE 2-4 GM/100ML-% IV SOLN
2.0000 g | Freq: Four times a day (QID) | INTRAVENOUS | Status: AC
  Administered 2020-06-01 – 2020-06-02 (×2): 2 g via INTRAVENOUS
  Filled 2020-06-01 (×2): qty 100

## 2020-06-01 MED ORDER — BUPIVACAINE HCL (PF) 0.5 % IJ SOLN
INTRAMUSCULAR | Status: AC
  Filled 2020-06-01: qty 30

## 2020-06-01 MED ORDER — FENTANYL CITRATE (PF) 100 MCG/2ML IJ SOLN
25.0000 ug | INTRAMUSCULAR | Status: AC | PRN
  Administered 2020-06-01 (×6): 25 ug via INTRAVENOUS

## 2020-06-01 MED ORDER — ONDANSETRON HCL 4 MG/2ML IJ SOLN
INTRAMUSCULAR | Status: AC
  Filled 2020-06-01: qty 2

## 2020-06-01 SURGICAL SUPPLY — 65 items
BASEPLATE GLENOSPHERE 25 (Plate) ×2 IMPLANT
BEARING HUMERAL +3 40D (Joint) ×2 IMPLANT
BIT DRILL TWIST 2.7 (BIT) ×2 IMPLANT
BLADE SAW SAG 25X90X1.19 (BLADE) ×2 IMPLANT
CANISTER SUCT 1200ML W/VALVE (MISCELLANEOUS) ×2 IMPLANT
CANISTER SUCT 3000ML PPV (MISCELLANEOUS) ×4 IMPLANT
CHLORAPREP W/TINT 26 (MISCELLANEOUS) ×2 IMPLANT
COOLER POLAR GLACIER W/PUMP (MISCELLANEOUS) ×2 IMPLANT
COVER BACK TABLE REUSABLE LG (DRAPES) ×2 IMPLANT
COVER WAND RF STERILE (DRAPES) ×2 IMPLANT
DIAL VERSA SHOULDER 40 STD (Joint) ×2 IMPLANT
DRAPE 3/4 80X56 (DRAPES) ×4 IMPLANT
DRAPE IMP U-DRAPE 54X76 (DRAPES) ×4 IMPLANT
DRAPE INCISE IOBAN 66X45 STRL (DRAPES) ×4 IMPLANT
DRSG OPSITE POSTOP 4X8 (GAUZE/BANDAGES/DRESSINGS) ×2 IMPLANT
ELECT BLADE 6.5 EXT (BLADE) IMPLANT
ELECT CAUTERY BLADE 6.4 (BLADE) ×2 IMPLANT
GLOVE INDICATOR 8.0 STRL GRN (GLOVE) ×2 IMPLANT
GLOVE SRG 8 PF TXTR STRL LF DI (GLOVE) ×1 IMPLANT
GLOVE SURG ENC MOIS LTX SZ7.5 (GLOVE) ×8 IMPLANT
GLOVE SURG ENC MOIS LTX SZ8 (GLOVE) ×8 IMPLANT
GLOVE SURG UNDER POLY LF SZ8 (GLOVE) ×1
GOWN STRL REUS W/ TWL LRG LVL3 (GOWN DISPOSABLE) ×1 IMPLANT
GOWN STRL REUS W/ TWL XL LVL3 (GOWN DISPOSABLE) ×1 IMPLANT
GOWN STRL REUS W/TWL LRG LVL3 (GOWN DISPOSABLE) ×1
GOWN STRL REUS W/TWL XL LVL3 (GOWN DISPOSABLE) ×1
HOOD PEEL AWAY FLYTE STAYCOOL (MISCELLANEOUS) ×8 IMPLANT
ILLUMINATOR WAVEGUIDE N/F (MISCELLANEOUS) IMPLANT
KIT STABILIZATION SHOULDER (MISCELLANEOUS) ×2 IMPLANT
KIT TURNOVER KIT A (KITS) ×2 IMPLANT
MANIFOLD NEPTUNE II (INSTRUMENTS) ×2 IMPLANT
MASK FACE SPIDER DISP (MASK) ×2 IMPLANT
MAT ABSORB  FLUID 56X50 GRAY (MISCELLANEOUS) ×1
MAT ABSORB FLUID 56X50 GRAY (MISCELLANEOUS) ×1 IMPLANT
NDL SAFETY ECLIPSE 18X1.5 (NEEDLE) ×1 IMPLANT
NEEDLE HYPO 18GX1.5 SHARP (NEEDLE) ×1
NEEDLE HYPO 22GX1.5 SAFETY (NEEDLE) ×2 IMPLANT
NEEDLE SPNL 20GX3.5 QUINCKE YW (NEEDLE) ×2 IMPLANT
NS IRRIG 500ML POUR BTL (IV SOLUTION) ×2 IMPLANT
PACK ARTHROSCOPY SHOULDER (MISCELLANEOUS) ×2 IMPLANT
PAD ARMBOARD 7.5X6 YLW CONV (MISCELLANEOUS) ×2 IMPLANT
PAD WRAPON POLAR SHDR UNIV (MISCELLANEOUS) ×1 IMPLANT
PENCIL SMOKE EVACUATOR (MISCELLANEOUS) ×2 IMPLANT
PIN THREADED REVERSE (PIN) ×2 IMPLANT
PULSAVAC PLUS IRRIG FAN TIP (DISPOSABLE) ×2
SCREW BONE LOCKING 4.75X35X3.5 (Screw) ×2 IMPLANT
SCREW BONE STRL 6.5MMX25MM (Screw) ×2 IMPLANT
SCREW LOCKING 4.75MMX15MM (Screw) ×4 IMPLANT
SCREW LOCKING STRL 4.75X25X3.5 (Screw) ×2 IMPLANT
SLING ULTRA II M (MISCELLANEOUS) ×2 IMPLANT
SOL .9 NS 3000ML IRR  AL (IV SOLUTION) ×1
SOL .9 NS 3000ML IRR UROMATIC (IV SOLUTION) ×1 IMPLANT
SPONGE LAP 18X18 RF (DISPOSABLE) ×2 IMPLANT
STAPLER SKIN PROX 35W (STAPLE) ×2 IMPLANT
STEM HUMERAL STRL 18MMX55MM (Stem) ×2 IMPLANT
SUT ETHIBOND 0 MO6 C/R (SUTURE) ×2 IMPLANT
SUT FIBERWIRE #2 38 BLUE 1/2 (SUTURE) ×8
SUT VIC AB 0 CT1 36 (SUTURE) ×2 IMPLANT
SUT VIC AB 2-0 CT1 27 (SUTURE) ×2
SUT VIC AB 2-0 CT1 TAPERPNT 27 (SUTURE) ×2 IMPLANT
SUTURE FIBERWR #2 38 BLUE 1/2 (SUTURE) ×4 IMPLANT
SYR 10ML LL (SYRINGE) ×2 IMPLANT
TIP FAN IRRIG PULSAVAC PLUS (DISPOSABLE) ×1 IMPLANT
TRAY HUM MINI SHOULDER +0 40D (Shoulder) ×2 IMPLANT
WRAPON POLAR PAD SHDR UNIV (MISCELLANEOUS) ×2

## 2020-06-01 NOTE — Op Note (Signed)
06/01/2020  10:21 AM  Patient:   Ryan Ali  Pre-Op Diagnosis:   Massive irreparable rotator cuff tear with cuff arthropathy, right shoulder.  Post-Op Diagnosis:   Same  Procedure:   Reverse right total shoulder arthroplasty.  Surgeon:   Pascal Lux, MD  Assistant:   Waylan Boga, RNFA; Jaynie Bream, PA-S  Anesthesia:   GET  Findings:   As above  Complications:   None  EBL:   600 cc  Fluids:   150 cc crystalloid  UOP:   None  TT:   None  Drains:   None  Closure:   Staples  Implants:   All press-fit Biomet Comprehensive system with a #18 micro-humeral stem, a 40 mm humeral tray with a +3 mm insert, and a mini-base plate with a 40 mm glenosphere.  Brief Clinical Note:   The patient is an 80 year old male with a long history of progressively worsening pain and weakness of his right shoulder. His symptoms have persisted despite medications, activity modification, etc. His history and examination are consistent with a chronic massive rotator cuff tear with cuff arthropathy, all of which were confirmed by MRI scan. The patient presents at this time for a reverse right total shoulder arthroplasty.  Procedure:   The patient was brought into the operating room and lain in the supine position. The patient then underwent general endotracheal intubation and anesthesia before the patient was repositioned in the beach chair position using the beach chair positioner. The right shoulder and upper extremity were prepped with ChloraPrep solution before being draped sterilely. Preoperative antibiotics were administered. A standard anterior approach to the shoulder was made through an approximately 4-5 inch incision. The incision was carried down through the subcutaneous tissues to expose the deltopectoral fascia. The interval between the deltoid and pectoralis muscles was identified and this plane developed, retracting the cephalic vein laterally with the deltoid muscle. The  conjoined tendon was identified. Its lateral margin was dissected and the Kolbel self-retraining retractor inserted. The "three sisters" were identified and cauterized. Bursal tissues were removed to improve visualization. The subscapularis tendon was released from its attachment to the lesser tuberosity 1 cm proximal to its insertion and several tagging sutures placed. The inferior capsule was released with care after identifying and protecting the axillary nerve. The proximal humeral cut was made at approximately 25 of retroversion using the extra-medullary guide.   Attention was redirected to the glenoid. The labrum was debrided circumferentially before the center of the glenoid was marked with electrocautery. The guidewire was drilled into the glenoid neck using the appropriate guide. After verifying its position, it was overreamed with the mini-baseplate reamer to create a flat surface. The permanent mini-baseplate was impacted into place. It was stabilized with a 25 x 6.5 mm central screw and four peripheral locking screws. The permanent 40 mm glenosphere was then impacted into place and its Morse taper locking mechanism verified using manual distraction.  Attention was directed to the humeral side. The humeral canal was reamed sequentially beginning with the end-cutting reamer then progressing from a 4 mm reamer up to a 18 mm reamer. This provided excellent circumferential chatter. The canal was broached beginning with a #16 broach and progressing to a #18 broach. This was left in place and a trial reduction performed using the standard platform with both the +0 mm and and +3 mm trial inserts.  With the +3 mm insert, the arm demonstrated good range of motion as the hand could be brought across  the chest to the opposite shoulder and brought to the top of the patient's head and to the patient's ear. The shoulder appeared stable throughout this range of motion. The joint was dislocated and the trial  components removed. The permanent #18 micro-stem was impacted into place with care taken to maintain the appropriate version. The permanent 40 mm humeral platform with the +3 mm insert was put together on the back table and impacted into place. Again, the [facility] taper locking mechanism was verified using manual distraction. The shoulder was relocated using two finger pressure and again placed through a range of motion with the findings as described above.  The wound was copiously irrigated with sterile saline solution using the jet lavage system before a total of 20 cc of Exparel diluted out to 60 cc with normal saline and 30 cc of 0.5% Sensorcaine with epinephrine was injected into the pericapsular and peri-incisional tissues to help with postoperative analgesia. The subscapularis tendon was reapproximated using #2 FiberWire interrupted sutures. The deltopectoral interval was closed using #0 Vicryl interrupted sutures before the subcutaneous tissues were closed using 2-0 Vicryl interrupted sutures. The skin was closed using staples. Prior to closing the skin, 1 g of transexemic acid in 10 cc of normal saline was injected intra-articularly to help with postoperative bleeding. A sterile occlusive dressing was applied to the wound before the arm was placed into a shoulder immobilizer with an abduction pillow. A Polar Care system also was applied to the shoulder. The patient was then transferred back to a hospital bed before being awakened, extubated, and returned to the recovery room in satisfactory condition after tolerating the procedure well.

## 2020-06-01 NOTE — Progress Notes (Signed)
PT Cancellation Note  Patient Details Name: Ryan Ali MRN: [id number] DOB: 1941-02-29   Cancelled Treatment:    Reason Eval/Treat Not Completed:  (Consult received and chart reviewed.  During interview, patient references recent L foot fracture. Upon further questioning (and more indepth chart review), patient with recent 2nd metatarsal fracture (05/06/20).  Per follow up with podiatry (05/11/20), patient to wear short-leg fracture boot and use RW for an additional month to ensure healing.  Patient endorses inconsistent use of fracture boot ("I've just been walking on my heel and it's done fine") and does not currently have on site for use with evaluation.  Phone call to wife to request boot be brought on site for use with evaluation.  Will re-attempt evaluation once wife arrives and boot available to maintain appropriate care to L foot with transfers/mobility)   Keymani Glynn H. Owens Shark, PT, DPT, NCS 06/01/20, 2:46 PM [phone]
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
## 2020-06-01 NOTE — Evaluation (Signed)
Physical Therapy Evaluation Patient Details Name: Ryan Ali MRN: [id number] DOB: 12/14/1940 Today's Date: 06/01/2020   History of Present Illness  seen in post-op area s/p R reverse TSA (06/01/20), NWB, immobilized post-surgery.  Clinical Impression  Patient seated in recliner upon arrival to session; alert and oriented, follows commands and agreeable to particpiation with session.  Exceptionally HOH (hearing aides not present).  R UE immobilized and well-positioned; rates pain 4/10 (meds received prior to session), does endorse good return of sensory awareness throughout R UE.  L UE, bilat LEs otherwise grossly WFL for strength and ROM, with the exception of L foot/ankle (recent 2nd metatarsal fracture, short-leg boot donned during session).  Currently requiring min/mod assist for sit/stand, standing balance, and gait (40' x2) with SPC.  Demonstrates mixed step to and step through gait pattern; min cuing for sequencing of cane (and reminders that he cannot use in R UE).  Generally unsteady, increased sway in all planes; limited balance reactions (relying on LE step strategy and physical assist from helper for balance recovery).  Desat to 80-81% on RA with gait efforts, requiring 60-90 seconds of seated rest and consistent pursed lip breathing for recovery >90%. Unsafe to attempt transfers/gait without consistent physical assist at this time; would benefit from additional monitoring of O2.  MD informed/aware of performance and mobility/O2 concerns noted during session. Would benefit from skilled PT to address above deficits and promote optimal return to PLOF.; will continue to assess/progress in subsequent sessions and follow surgeon's plans/recommendation for DC and follow-up therapies as medically appropriate.    Follow Up Recommendations Follow surgeons recommendation for DC plan and follow-up therapies (pending progress noted in next session)    Equipment Recommendations        Recommendations for Other Services       Precautions / Restrictions Precautions Precautions: Fall Restrictions Weight Bearing Restrictions: Yes RUE Weight Bearing: Non weight bearing      Mobility  Bed Mobility               General bed mobility comments: seated in recliner beginning/end of treatment session    Transfers Overall transfer level: Needs assistance   Transfers: Sit to/from Stand Sit to Stand: Min assist;Mod assist         General transfer comment: cuing for hand placement, movement transitions; generally unsteady (increased sway, all planes) with initial stance, unsafe to attempt without physical assist +1  Ambulation/Gait Ambulation/Gait assistance: Min assist;+2 safety/equipment Gait Distance (Feet): 50 Feet Assistive device: Straight cane       General Gait Details: mixed step to and step through gait pattern; min cuing for sequencing of cane (and reminders that he cannot use in R UE).  Generally unsteady, increased sway in all planes; limited balance reactions (relying on LE step strategy and physical assist from helper for balance recovery).  Desat to 80-81% on RA with gait efforts, requiring 60-90 seconds of seated rest and consistent pursed lip breathing for recovery >90%.  Stairs Stairs: Yes Stairs assistance: Min assist;+2 safety/equipment Stair Management: One rail Right Number of Stairs: 4 General stair comments: step to gait pattern, completed in 'sideways' fashion while holding R ascending rail with L UE.  Prefers to ascend with R LE, descend with R LE; noted difficulty with L LE placement on step when descending (due to width of short-leg boot)  Wheelchair Mobility    Modified Rankin (Stroke Patients Only)       Balance Overall balance assessment: Needs assistance Sitting-balance support: No upper  extremity supported;Feet supported Sitting balance-Leahy Scale: Good     Standing balance support: Single extremity  supported Standing balance-Leahy Scale: Poor                               Pertinent Vitals/Pain Pain Assessment: 0-10 Pain Score: 4  Pain Location: R shoulder Pain Descriptors / Indicators: Aching;Grimacing;Guarding Pain Intervention(s): Limited activity within patient's tolerance;Premedicated before session;Monitored during session;Repositioned    Home Living Family/patient expects to be discharged to:: Private residence Living Arrangements: Spouse/significant other Available Help at Discharge: Family Type of Home: House Home Access: Stairs to enter Entrance Stairs-Rails: Right Entrance Stairs-Number of Steps: 3 Home Layout: One level Home Equipment: Walker - 2 wheels Additional Comments: Of note, wife dependant on [facility] for mobility herself; will be able to offer very limited/no physical assist to patient for transfer/gait efforts.    Prior Function Level of Independence: Independent         Comments: Indep without assist device for ADLs, household mobilization at baseline.  Has been using RW since recent L foot fracture (05/06/20 per chart); per notes, to wear L short-leg boot with gait efforts. No home O2.     Hand Dominance        Extremity/Trunk Assessment   Upper Extremity Assessment Upper Extremity Assessment:  (R UE immobilized in sling; able to flex, extend fingers and hand and endorses normal awareness to light touch R UE.  L UE grossly WFL)    Lower Extremity Assessment Lower Extremity Assessment:  (bilat LEs grossly 4/5 throughout; L ankle/foot in short-leg boot)       Communication   Communication: No difficulties  Cognition Arousal/Alertness: Awake/alert Behavior During Therapy: WFL for tasks assessed/performed Overall Cognitive Status: Within Functional Limits for tasks assessed                                        General Comments      Exercises Other Exercises Other Exercises: Additional 28' with SPC,  progressing towards min assist +1 (from +2)--improved use and placement of SPC, but continues to require hands-on assist for balance/safety.  Patient with limited insight/safety awareness; high fall risk with mobility tasks. Other Exercises: Did initiate education on polar care, positioning and NWB status; wife with lots of questions (answered to ability), very receptive to education.  Will benefit from OT consult for additional educational opportunities related to ADLs, sling management.   Assessment/Plan    PT Assessment Patient needs continued PT services  PT Problem List Decreased strength;Decreased range of motion;Decreased activity tolerance;Decreased balance;Decreased mobility;Decreased coordination;Decreased cognition;Decreased knowledge of use of DME;Decreased safety awareness;Decreased knowledge of precautions;Cardiopulmonary status limiting activity;Decreased skin integrity;Pain;Obesity       PT Treatment Interventions DME instruction;Gait training;Stair training;Functional mobility training;Therapeutic activities;Therapeutic exercise;Balance training;Patient/family education;Cognitive remediation    PT Goals (Current goals can be found in the Care Plan section)  Acute Rehab PT Goals Patient Stated Goal: to go home PT Goal Formulation: With patient/family Time For Goal Achievement: 06/15/20 Potential to Achieve Goals: Good    Frequency BID   Barriers to discharge        Co-evaluation               AM-PAC PT "6 Clicks" Mobility  Outcome Measure Help needed turning from your back to your side while in a flat bed without using bedrails?: A  Little Help needed moving from lying on your back to sitting on the side of a flat bed without using bedrails?: A Lot Help needed moving to and from a bed to a chair (including a wheelchair)?: A Lot Help needed standing up from a chair using your arms (e.g., wheelchair or bedside chair)?: A Lot Help needed to walk in hospital room?: A  Lot Help needed climbing 3-5 steps with a railing? : A Lot 6 Click Score: 13    End of Session Equipment Utilized During Treatment: Gait belt Activity Tolerance: Patient tolerated treatment well Patient left: in chair;with call bell/phone within reach;with family/visitor present Nurse Communication: Mobility status PT Visit Diagnosis: Muscle weakness (generalized) (M62.81);Difficulty in walking, not elsewhere classified (R26.2);Pain Pain - Right/Left: Right Pain - part of body: Shoulder    Time: 8616-8372 PT Time Calculation (min) (ACUTE ONLY): 36 min   Charges:   PT Evaluation $PT Eval Moderate Complexity: 1 Mod PT Treatments $Therapeutic Activity: 23-37 mins        Sallie Maker H. Owens Shark, PT, DPT, NCS 06/01/20, 4:58 PM [phone]

## 2020-06-01 NOTE — Transfer of Care (Signed)
Immediate Anesthesia Transfer of Care Note  Patient: Ryan Ali  Procedure(s) Performed: REVERSE SHOULDER ARTHROPLASTY (Right Shoulder)  Patient Location: PACU  Anesthesia Type:General  Level of Consciousness: drowsy  Airway & Oxygen Therapy: Patient Spontanous Breathing and Patient connected to face mask oxygen  Post-op Assessment: Report given to RN and Post -op Vital signs reviewed and stable  Post vital signs: Reviewed and stable  Last Vitals:  Vitals Value Taken Time  BP 135/78 06/01/20 1015  Temp    Pulse 67 06/01/20 1017  Resp 12 06/01/20 1017  SpO2 98 % 06/01/20 1017  Vitals shown include unvalidated device data.  Last Pain:  Vitals:   06/01/20 0614  PainSc: 0-No pain         Complications: No complications documented.

## 2020-06-01 NOTE — Progress Notes (Signed)
Pt drowsy. Pt on 2 liters oxygen via nasal cannula. Pt drops down to 86% but then returns back to 94-95%. Dr. Kayleen Memos notified. Acknowledged. Orders received.

## 2020-06-01 NOTE — Progress Notes (Addendum)
Patient transported to Room 131 via wheelchair with all equipment and belongings. Family was notified of the transfer. Report given to Ortho floor nurse Arn Medal RN.

## 2020-06-01 NOTE — Anesthesia Procedure Notes (Signed)
Procedure Name: Intubation Date/Time: 06/01/2020 7:38 AM Performed by: Lily Peer, Jereld Presti, CRNA Pre-anesthesia Checklist: Patient identified, Emergency Drugs available, Suction available and Patient being monitored Patient Re-evaluated:Patient Re-evaluated prior to induction Oxygen Delivery Method: Circle system utilized Preoxygenation: Pre-oxygenation with 100% oxygen Induction Type: IV induction Ventilation: Mask ventilation without difficulty Laryngoscope Size: McGraph and 4 Grade View: Grade I Tube type: Oral Tube size: 7.5 mm Number of attempts: 1 Airway Equipment and Method: Stylet and Oral airway Placement Confirmation: ETT inserted through vocal cords under direct vision,  positive ETCO2 and breath sounds checked- equal and bilateral Secured at: 21 cm Tube secured with: Tape Dental Injury: Teeth and Oropharynx as per pre-operative assessment

## 2020-06-01 NOTE — Discharge Instructions (Addendum)
Orthopedic discharge instructions: May shower with intact OpSite dressing.  Apply ice frequently to shoulder or use Polar Care device. Take Aleve 2 tablets BID with meals for 7-10 days, then as necessary. Take oxycodone as prescribed when needed.  May supplement with ES Tylenol if necessary. Keep shoulder immobilizer on at all times except may remove for bathing purposes. Follow-up in 10-14 days or as scheduled.   AMBULATORY SURGERY  DISCHARGE INSTRUCTIONS   1) The drugs that you were given will stay in your system until tomorrow so for the next 24 hours you should not:  A) Drive an automobile B) Make any legal decisions C) Drink any alcoholic beverage   2) You may resume regular meals tomorrow.  Today it is better to start with liquids and gradually work up to solid foods.  You may eat anything you prefer, but it is better to start with liquids, then soup and crackers, and gradually work up to solid foods.   3) Please notify your doctor immediately if you have any unusual bleeding, trouble breathing, redness and pain at the surgery site, drainage, fever, or pain not relieved by medication.    4) Additional Instructions:        Please contact your physician with any problems or Same Day Surgery at [phone], Monday through Friday 6 am to 4 pm, or [place] at [facility] number at [phone].

## 2020-06-02 ENCOUNTER — Encounter: Payer: Self-pay | Admitting: Surgery

## 2020-06-02 LAB — SURGICAL PATHOLOGY

## 2020-06-02 MED ORDER — ASPIRIN EC 325 MG PO TBEC: 325.0000 mg | DELAYED_RELEASE_TABLET | Freq: Every day | ORAL | 0 refills | Status: AC

## 2020-06-02 MED ORDER — OXYCODONE HCL 5 MG PO TABS: 5.0000 mg | ORAL_TABLET | ORAL | 0 refills | Status: DC | PRN

## 2020-06-02 MED ORDER — TRAMADOL HCL 50 MG PO TABS
50.0000 mg | ORAL_TABLET | Freq: Four times a day (QID) | ORAL | Status: DC | PRN
  Administered 2020-06-02 – 2020-06-03 (×2): 50 mg via ORAL
  Filled 2020-06-02 (×2): qty 1

## 2020-06-02 MED ORDER — OXYCODONE HCL 5 MG PO TABS
5.0000 mg | ORAL_TABLET | ORAL | Status: DC | PRN
  Administered 2020-06-02 – 2020-06-03 (×3): 10 mg via ORAL
  Filled 2020-06-02 (×3): qty 2

## 2020-06-02 NOTE — Progress Notes (Signed)
PT Contact Note  SaO2 on room air at rest = 89% SaO2 on room air while ambulating = 80-84% SaO2 on 2 liters of O2 while ambulating = 90%  Ryan Ali, PT, DPT, NCS 06/02/20, 4:54 PM [phone]

## 2020-06-02 NOTE — Evaluation (Signed)
Occupational Therapy Evaluation Patient Details Name: Ryan Ali MRN: [id number] DOB: 1941-02-02 Today's Date: 06/02/2020    History of Present Illness seen in post-op area s/p R reverse TSA (06/01/20), NWB, immobilized post-surgery. Recent L foot fracture resulting in need for post-op shoe for all weight bearing.   Clinical Impression   Patient was seen for an OT evaluation this date. Pt lives with his spouse in a 1 story home with a dog. Prior to surgery, pt was recently requiring use of a RW for mobility 2/2 recent L foot fracture and need for post-op shoe. However according to pt, he stopped using it. Pt with post-op shoe in place for OT evaluation. Pt has orders for dominant RUE to be immobilized and will be NWBing per MD. Patient presents with impaired strength/ROM, pain, balance, activity tolerance, cardiopulmonary status (requiring 2L O2 due to desaturation to mid 80's on room air with exertion - not on O2 at home prior), and safety awareness including awareness of overall deficits. Very high falls risk. These impairments result in a decreased ability to perform self care tasks requiring MOD A for UB/LB dressing and bathing and MAX A for application of polar care, compression stockings, and sling/immobilizer. Pt and spouse extensively instructed in polar care mgt, compression stockings mgt, sling/immobilizer mgt, pet care considerations, ROM exercises/precautions for RUE, adaptive strategies for bathing/dressing/toileting/grooming, positioning and considerations for sleep, and home/routines modifications to maximize falls prevention, safety, and independence. Handout provided. OT adjusted sling/immobilizer and polar care to improve comfort, optimize positioning, and to maximize skin integrity/safety. Pt and spouse verbalized understanding of all education/training provided. Spouse endorsed feeling "2/10 confidence" with managing all equipment at home. Pt will benefit from skilled OT  services to address these limitations and improve independence in daily tasks. Recommend follow up per surgeon for additional therapy. Pt would benefit from additional skilled OT to maximize safety, minimize falls risk, and support optimal return to PLOF while minimizing caregiver burden. MD, CM notified.    Follow Up Recommendations  Follow surgeon's recommendation for DC plan and follow-up therapies    Equipment Recommendations  None recommended by OT;Other (comment) (loftstrand crutch for LUE)    Recommendations for Other Services       Precautions / Restrictions Precautions Precautions: Fall Restrictions Weight Bearing Restrictions: Yes RUE Weight Bearing: Non weight bearing      Mobility Bed Mobility               General bed mobility comments: seated in recliner beginning/end of treatment session    Transfers Overall transfer level: Needs assistance Equipment used: Lofstrands (in LUE) Transfers: Sit to/from Stand Sit to Stand: Min guard              Balance Overall balance assessment: Needs assistance Sitting-balance support: No upper extremity supported;Feet supported Sitting balance-Leahy Scale: Good     Standing balance support: Single extremity supported Standing balance-Leahy Scale: Fair                             ADL either performed or assessed with clinical judgement   ADL Overall ADL's : Needs assistance/impaired                                       General ADL Comments: Pt requires MOD A for LB ADL, UB ADL including underarm grooming, bathing, donning shirts, MAX  A for shoulder sling and polar care mgt, cues for safety     Vision Baseline Vision/History: Glaucoma Patient Visual Report: No change from baseline       Perception     Praxis      Pertinent Vitals/Pain Pain Assessment: 0-10 Pain Score: 8  Pain Location: R shoulder Pain Descriptors / Indicators: Aching;Grimacing;Guarding Pain  Intervention(s): Limited activity within patient's tolerance;Monitored during session;Ice applied;Repositioned;Patient requesting pain meds-RN notified     Hand Dominance Right   Extremity/Trunk Assessment Upper Extremity Assessment Upper Extremity Assessment: RUE deficits/detail RUE Deficits / Details: R UE immobilized in sling; able to flex, extend fingers and hand and endorses normal awareness to light touch R UE. L UE grossly WFL Difficulty with AAROM elbow ROM RUE: Unable to fully assess due to pain RUE Sensation: WNL RUE Coordination: decreased fine motor;decreased gross motor   Lower Extremity Assessment Lower Extremity Assessment: Generalized weakness;LLE deficits/detail LLE Deficits / Details: L ankle/foot in short-leg boot       Communication Communication Communication: No difficulties   Cognition Arousal/Alertness: Awake/alert Behavior During Therapy: WFL for tasks assessed/performed Overall Cognitive Status: Within Functional Limits for tasks assessed                                 General Comments: mild impairment in safety and awareness of deficits   General Comments       Exercises Other Exercises Other Exercises: Pt/spouse instructed extensively in polar care mgt, compression stocking mgt, shoulder sling/immobilizer, falls prevention, pet care considerations, shoulder precautions and how to maintain during ADL/mobility, positioning for sleep and rest in bed/chair, and ROM ex. Handout provided to support recall and carryover. Will benefit from additional training.   Shoulder Instructions      Home Living Family/patient expects to be discharged to:: Private residence Living Arrangements: Spouse/significant other Available Help at Discharge: Family Type of Home: House Home Access: Stairs to enter Technical brewer of Steps: 3 Entrance Stairs-Rails: Right Home Layout: One level               Home Equipment: [place] - 2 wheels    Additional Comments: Of note, wife dependant on [facility] for mobility herself; will be able to offer very limited/no physical assist to patient for transfer/gait efforts.      Prior Functioning/Environment Level of Independence: Independent        Comments: Indep without assist device for ADLs, household mobilization at baseline.  Has been using RW since recent L foot fracture (05/06/20 per chart); per notes, to wear L short-leg boot with gait efforts. No home O2. However, wife reports pt O2 in 80's to low 90's at baseline when she checks it with a pulse oximeter.        OT Problem List: Decreased safety awareness;Decreased activity tolerance;Cardiopulmonary status limiting activity;Pain;Decreased strength;Decreased range of motion;Impaired balance (sitting and/or standing);Decreased knowledge of use of DME or AE;Impaired UE functional use;Decreased knowledge of precautions;Impaired vision/perception      OT Treatment/Interventions: Self-care/ADL training;Therapeutic exercise;Therapeutic activities;DME and/or AE instruction;Energy conservation;Patient/family education;Balance training;Manual therapy    OT Goals(Current goals can be found in the care plan section) Acute Rehab OT Goals Patient Stated Goal: to go home OT Goal Formulation: With patient/family Time For Goal Achievement: 06/16/20 Potential to Achieve Goals: Good ADL Goals Pt Will Perform Upper Body Dressing: with caregiver independent in assisting;sitting Pt Will Perform Lower Body Dressing: with caregiver independent in assisting;sit to/from stand Pt Will  Transfer to Toilet: ambulating (LRAD for amb, elevated commode) Additional ADL Goal #1: Pt/spouse will demo independence with shoulder sling/immobilizer mgt Additional ADL Goal #2: Pt/spouse will demo independence with shoulder polar care mgt  OT Frequency: Min 2X/week   Barriers to D/C: Decreased caregiver support  wife requires [facility] for mobility, limited physical assist        Co-evaluation              AM-PAC OT "6 Clicks" Daily Activity     Outcome Measure Help from another person eating meals?: A Little Help from another person taking care of personal grooming?: A Little Help from another person toileting, which includes using toliet, bedpan, or urinal?: A Little Help from another person bathing (including washing, rinsing, drying)?: A Lot Help from another person to put on and taking off regular upper body clothing?: A Lot Help from another person to put on and taking off regular lower body clothing?: A Lot 6 Click Score: 15   End of Session Equipment Utilized During Treatment: Gait belt;Oxygen Nurse Communication: Mobility status;Patient requests pain meds  Activity Tolerance: Patient tolerated treatment well Patient left: in chair;with call bell/phone within reach;with family/visitor present;Other (comment) (sling, polar care in place)  OT Visit Diagnosis: Other abnormalities of gait and mobility (R26.89);Muscle weakness (generalized) (M62.81);Pain Pain - Right/Left: Right Pain - part of body: Shoulder                Time: 6546-5035 OT Time Calculation (min): 66 min Charges:  OT General Charges $OT Visit: 1 Visit OT Evaluation $OT Eval High Complexity: 1 High OT Treatments $Self Care/Home Management : 53-67 mins  Hanley Hays, MPH, MS, OTR/L ascom [phone] 06/02/20, 3:03 PM

## 2020-06-02 NOTE — Progress Notes (Signed)
Physical Therapy Treatment Patient Details Name: Ryan Ali MRN: [id number] DOB: 12/08/1940 Today's Date: 06/02/2020    History of Present Illness s/p R reverse TSA (06/01/20), NWB, immobilized post-surgery. Recent L foot fracture resulting in need for post-op shoe for all weight bearing.    PT Comments    Trialed flat-sole post-op shoe to L LE during PM gait trial; significant improvement in balance and overall safety (compared to trials with fracture boot).  Do recommend continued use of L post-op shoe and loftstrand with ALL mobility efforts at this time.  Reviewed donning/doffing and wear/use of L post-op shoe; patient voiced understanding and awareness.  Voiced subjective agreement that comfort/confidence in mobiltiy maximized with use of flat-sole shoe. May benefit from education with wife to reinforce donning/doffing/use in home environment     Follow Up Recommendations  Home health PT     Equipment Recommendations   (loftstrand crutches)    Recommendations for Other Services       Precautions / Restrictions Precautions Precautions: Fall Restrictions Weight Bearing Restrictions: Yes RUE Weight Bearing: Non weight bearing    Mobility  Bed Mobility Overal bed mobility: Modified Independent             General bed mobility comments: seated in recliner beginning/end of treatment session    Transfers Overall transfer level: Needs assistance Equipment used: Lofstrands Transfers: Sit to/from Stand Sit to Stand: Min guard         General transfer comment: slightly impulsive with sit/stand; increased sway in A/P plane, difficulty achieving solid balance due to L fracture boot  Ambulation/Gait Ambulation/Gait assistance: Min guard Gait Distance (Feet): 100 Feet Assistive device: Lofstrands       General Gait Details: flat-sole post-op shoe to L LE during PM gait trial; significant improvement in balance and overall safety (compared to trials with  fracture boot).  Do recommend continued use of L post-op shoe and loftstrand with ALL mobility efforts at this time.   Stairs Stairs: Yes   Stair Management: One rail Right Number of Stairs:  (4 x2) General stair comments: step to gait pattern, completed in 'sideways' fashion while holding R ascending rail with L UE.  Prefers to ascend with R LE, descend with R LE; improved placement of bilat feet when descnding to ensure room for L fracture boot on step.  Did trial descending foward (versus sideways), but patient feels optimal control and safety facing rail and descending sideways   Wheelchair Mobility    Modified Rankin (Stroke Patients Only)       Balance Overall balance assessment: Needs assistance Sitting-balance support: No upper extremity supported;Feet supported Sitting balance-Leahy Scale: Good     Standing balance support: Single extremity supported Standing balance-Leahy Scale: Fair                              Cognition Arousal/Alertness: Awake/alert Behavior During Therapy: WFL for tasks assessed/performed Overall Cognitive Status: Within Functional Limits for tasks assessed                                 General Comments: mild impairment in safety and awareness of deficits      Exercises Other Exercises Other Exercises: Reviewed donning/doffing and wear/use of L post-op shoe; patient voiced understanding and awareness.  Voiced subjective agreement that comfort/confidence in mobiltiy maximized with use of flat-sole shoe. May benefit from education with  wife to reinforce donning/doffing/use in home environment    General Comments        Pertinent Vitals/Pain Pain Assessment: 0-10 Pain Score: 5  Pain Location: R shoulder Pain Descriptors / Indicators: Aching;Grimacing;Guarding Pain Intervention(s): Limited activity within patient's tolerance;Monitored during session;Repositioned    Home Living                       Prior Function            PT Goals (current goals can now be found in the care plan section) Acute Rehab PT Goals Patient Stated Goal: to go home PT Goal Formulation: With patient/family Time For Goal Achievement: 06/15/20 Potential to Achieve Goals: Good Progress towards PT goals: Progressing toward goals    Frequency    BID      PT Plan Current plan remains appropriate    Co-evaluation              AM-PAC PT "6 Clicks" Mobility   Outcome Measure  Help needed turning from your back to your side while in a flat bed without using bedrails?: None Help needed moving from lying on your back to sitting on the side of a flat bed without using bedrails?: None Help needed moving to and from a bed to a chair (including a wheelchair)?: A Little Help needed standing up from a chair using your arms (e.g., wheelchair or bedside chair)?: A Little Help needed to walk in hospital room?: A Little Help needed climbing 3-5 steps with a railing? : A Little 6 Click Score: 20    End of Session Equipment Utilized During Treatment: Gait belt;Oxygen Activity Tolerance: Patient tolerated treatment well Patient left: in chair;with call bell/phone within reach Nurse Communication: Mobility status PT Visit Diagnosis: Muscle weakness (generalized) (M62.81);Difficulty in walking, not elsewhere classified (R26.2);Pain Pain - Right/Left: Right Pain - part of body: Shoulder     Time: 7121-9758 PT Time Calculation (min) (ACUTE ONLY): 13 min  Charges:  $Gait Training: 8-22 mins $Therapeutic Activity: 23-37 mins                     Kristen H. Owens Shark, PT, DPT, NCS 06/02/20, 10:33 PM [phone]

## 2020-06-02 NOTE — TOC Initial Note (Addendum)
Transition of Care [facility]) - Initial/Assessment Note    Patient Details  Name: Ryan Ali MRN: [id number] Date of Birth: [birth year]  [facility] CM/SW Contact:    Ryan Ivan, LCSW Phone Number: 06/02/2020, 2:24 PM  Clinical Narrative:                CSW met with patient at bedside and spoke with wife via phone afterwards. Patient lives with spouse who will be driving him to appointments. PCP is Dr Ryan Ali or [facility]. Pharmacy is [facility] or [place]. Patient has a cane, RW, and 3 in 1 at home. Surgeon's Office has arranged HHPT through [facility] which patient is agreable to. Patient was agreeable to home o2 and loftstrand crutches being ordered. Referral to [employer] for DME, Ryan Ali is checking to see if they have loftstrand crutches, if not patient will need to order online. Checked with [employer] who reported they do not have these crutches. Discussed with patient's wife that PT/OT are possibly going to recommend SNF, she reported patient would not agree to go to SNF due to [place] and wanting to go home. Updated care team.  2:53- Asked Ryan Ali with [facility] to add HHOT and Aide services per OT recommendations.  3:15- Ryan Ali with [employer] able to provide loftstrand crutches. Asked MD/PA for orders for DME and Home Health. Asked PT/LPN for sat note for O2.    Expected Discharge Plan: [place] Barriers to Discharge: Continued Medical Work up   Patient Goals and CMS Choice Patient states their goals for this hospitalization and ongoing recovery are:: home with home health CMS Medicare.gov Compare Post Acute Care list provided to:: Patient Choice offered to / list presented to : Patient  Expected Discharge Plan and Services Expected Discharge Plan: [place]       Living arrangements for the past 2 months: Single Family Home Expected Discharge Date: 06/01/20               DME Arranged: Oxygen DME Agency: [facility] Date DME  Agency Contacted: 06/02/20   Representative spoke with at DME Agency: Ryan Ali HH Arranged: PT ([place] arranged through [employer])          Prior Living Arrangements/Services Living arrangements for the past 2 months: [place] Lives with:: Spouse Patient language and need for interpreter reviewed:: Yes Do you feel safe going back to the place where you live?: Yes      Need for Family Participation in Patient Care: Yes (Comment) Care giver support system in place?: Yes (comment) Current home services: DME,Home PT Criminal Activity/Legal Involvement Pertinent to Current Situation/Hospitalization: No - Comment as needed  Activities of Daily Living Home Assistive Devices/Equipment: Gilford Rile (specify type) ADL Screening (condition at time of admission) Patient's cognitive ability adequate to safely complete daily activities?: Yes Is the patient deaf or have difficulty hearing?: Yes Does the patient have difficulty seeing, even when wearing glasses/contacts?: No Does the patient have difficulty concentrating, remembering, or making decisions?: No Patient able to express need for assistance with ADLs?: Yes Does the patient have difficulty dressing or bathing?: No Independently performs ADLs?: Yes (appropriate for developmental age) Does the patient have difficulty walking or climbing stairs?: No Weakness of Legs: None Weakness of Arms/Hands: Left  Permission Sought/Granted Permission sought to share information with : Facility Contact Representative,Family Supports Permission granted to share information with : Yes, Verbal Permission Granted     Permission granted to share  info w AGENCY: HH, DME  Permission granted to share info w Relationship: spouse     Emotional Assessment       Orientation: : Oriented to Self,Oriented to Place,Oriented to  Time,Oriented to Situation Alcohol / Substance Use: Not Applicable Psych Involvement: No (comment)  Admission  diagnosis:  Status post reverse arthroplasty of shoulder, right [Z96.611] Patient Active Problem List   Diagnosis Date Noted  . Status post reverse arthroplasty of shoulder, right 06/01/2020  . Leg cramps 01/07/2020  . Atherosclerosis of native arteries of extremity with intermittent claudication ([place]) 12/24/2019  . Thrombocytopenia ([place]) 11/03/2019  . DDD (degenerative disc disease), cervical 02/26/2018  . History of adenomatous polyp of colon 01/13/2017  . Abdominal aneurysm ([place]) 12/16/2014  . Arthritis 12/16/2014  . Barrett's esophagus 12/16/2014  . Prediabetes 12/16/2014  . Difficulty staying awake 12/16/2014  . Cervical pain 12/16/2014  . Fatigue 07/28/2009  . Personal history of pulmonary embolism 12/27/2007  . Multiple myeloma in remission ([place]) 03/26/2003  . COPD (chronic obstructive pulmonary disease) ([place]) 03/25/1998  . Acid reflux 03/25/1998  . Hypothyroidism, postop 03/25/1998   PCP:  Ryan Sons, MD Pharmacy:   [facility] [phone] [place] [address] Phone: [phone] Fax: [phone]     Social Determinants of Health (SDOH) Interventions    Readmission Risk Interventions No flowsheet data found.

## 2020-06-02 NOTE — Progress Notes (Signed)
Physical Therapy Treatment Patient Details Name: Ryan Ali MRN: [id number] DOB: 03-Sep-1940 Today's Date: 06/02/2020    History of Present Illness seen in post-op area s/p R reverse TSA (06/01/20), NWB, immobilized post-surgery. Recent L foot fracture resulting in need for post-op shoe for all weight bearing.    PT Comments    Extensive gait training with various assist devices to determine optimal device as it relates to safety/indep with gait.  Multiple trials of 25-50', trialing SPC (min assist +1-2), loftstrand (min assist +1), SBQC (min assist +2) and hemi-walker (min assist +2).  Marked difficulty coordinating devices and continued balance deficits noted with all except loftstrand.  While still requires min assist +1 even wiht loftstrand, marked improvement in balance, safety and overall control when compared to other assist devices. L fracture boot presents as significant barrier to balance and safety with transfers/gait.  Discussed with ortho physician; may plan to trial flat-sole post-op shoe in PM session.  Of note, continued desat to 80-84% with exertion on RA; requiring 2L supplemental O2 to maintain sats >90% with gait.  Anticipate possible need for home O2 at discharge.  MD/TOC informed/aware.    Follow Up Recommendations  Follow surgeon's recommendation for DC plan and follow-up therapies     Equipment Recommendations   (loftstrand crutches)    Recommendations for Other Services       Precautions / Restrictions Precautions Precautions: Fall Restrictions Weight Bearing Restrictions: Yes RUE Weight Bearing: Non weight bearing    Mobility  Bed Mobility Overal bed mobility: Modified Independent             General bed mobility comments: transition towards L side of bed to avoid WBing R UE (patient reports plans to sleep in recliner upno discharge)    Transfers Overall transfer level: Needs assistance Equipment used: Straight cane Transfers: Sit  to/from Stand Sit to Stand: Min guard         General transfer comment: slightly impulsive with sit/stand; increased sway in A/P plane, difficulty achieving solid balance due to L fracture boot  Ambulation/Gait Ambulation/Gait assistance: Min assist;+2 safety/equipment Gait Distance (Feet):  (50' x2) Assistive device: Lofstrands       General Gait Details: partially reciprocal stepping pattern, min cuing for slower, controlled 3-point gait pattern to maximize balance/safety.  Fair/good sequencing of loftstrand; optimal balance/stability with use of loftstrand (versus other assist device).  Desat 80-84% on RA; requiring 2L supplemental O2 with exertion to maintain sats >90% with gait.   Stairs Stairs: Yes   Stair Management: One rail Right Number of Stairs:  (4 x2) General stair comments: step to gait pattern, completed in 'sideways' fashion while holding R ascending rail with L UE.  Prefers to ascend with R LE, descend with R LE; improved placement of bilat feet when descnding to ensure room for L fracture boot on step.  Did trial descending foward (versus sideways), but patient feels optimal control and safety facing rail and descending sideways   Wheelchair Mobility    Modified Rankin (Stroke Patients Only)       Balance Overall balance assessment: Needs assistance Sitting-balance support: No upper extremity supported;Feet supported Sitting balance-Leahy Scale: Good     Standing balance support: Single extremity supported Standing balance-Leahy Scale: Fair                              Cognition Arousal/Alertness: Awake/alert Behavior During Therapy: WFL for tasks assessed/performed Overall Cognitive Status: Within  Functional Limits for tasks assessed                                 General Comments: mild impairment in safety and awareness of deficits      Exercises Other Exercises Other Exercises: Extensive gait training with various  assist devices to determine optimal device as it relates to safety/indep with gait.  Multiple trials of 25-50', trialing SPC (min assist +1-2), loftstrand (min assist +1), SBQC (min assist +2) and hemi-walker (min assist +2).  Marked difficulty coordinating devices and continued balance deficits noted with all except loftstrand.  While still requires min assist +1 even wiht loftstrand, marked improvement in balance, safety and overall control when compared to other assist devices.    General Comments        Pertinent Vitals/Pain Pain Assessment: 0-10 Pain Score: 4  Pain Location: R shoulder Pain Descriptors / Indicators: Aching;Grimacing;Guarding Pain Intervention(s): Monitored during session;Repositioned    Home Living                      Prior Function            PT Goals (current goals can now be found in the care plan section) Acute Rehab PT Goals Patient Stated Goal: to go home PT Goal Formulation: With patient/family Time For Goal Achievement: 06/15/20 Potential to Achieve Goals: Good Progress towards PT goals: Progressing toward goals    Frequency    BID      PT Plan Current plan remains appropriate    Co-evaluation              AM-PAC PT "6 Clicks" Mobility   Outcome Measure  Help needed turning from your back to your side while in a flat bed without using bedrails?: None Help needed moving from lying on your back to sitting on the side of a flat bed without using bedrails?: None Help needed moving to and from a bed to a chair (including a wheelchair)?: A Little Help needed standing up from a chair using your arms (e.g., wheelchair or bedside chair)?: A Little Help needed to walk in hospital room?: A Lot Help needed climbing 3-5 steps with a railing? : A Little 6 Click Score: 19    End of Session Equipment Utilized During Treatment: Gait belt;Oxygen Activity Tolerance: Patient tolerated treatment well Patient left: in chair;with call  bell/phone within reach Nurse Communication: Mobility status PT Visit Diagnosis: Muscle weakness (generalized) (M62.81);Difficulty in walking, not elsewhere classified (R26.2);Pain Pain - Right/Left: Right Pain - part of body: Shoulder     Time: 2694-8546 PT Time Calculation (min) (ACUTE ONLY): 54 min  Charges:  $Gait Training: 23-37 mins $Therapeutic Activity: 23-37 mins                     Kristen H. Owens Shark, PT, DPT, NCS 06/02/20, 10:24 PM [phone]

## 2020-06-02 NOTE — Progress Notes (Signed)
  Subjective: 1 Day Post-Op Procedure(s) (LRB): REVERSE SHOULDER ARTHROPLASTY (Right) Patient reports pain as well-controlled.   Patient is well, and has had no acute complaints or problems Plan is to go Home after hospital stay. Negative for chest pain and shortness of breath Fever: no Gastrointestinal: negative for nausea and vomiting.   Patient has not had a bowel movement.  Objective: Vital signs in last 24 hours: Temp:  [97 F (36.1 C)-98.6 F (37 C)] 97.9 F (36.6 C) (03/11 0730) Pulse Rate:  [63-83] 83 (03/11 0730) Resp:  [10-18] 17 (03/11 0730) BP: (106-158)/(67-103) 151/96 (03/11 0730) SpO2:  [84 %-100 %] 86 % (03/11 0730)  Intake/Output from previous day:  Intake/Output Summary (Last 24 hours) at 06/02/2020 0819 Last data filed at 06/02/2020 0517 Gross per 24 hour  Intake 600 ml  Output 250 ml  Net 350 ml    Intake/Output this shift: No intake/output data recorded.  Labs: No results for input(s): HGB in the last 72 hours. No results for input(s): WBC, RBC, HCT, PLT in the last 72 hours. No results for input(s): NA, K, CL, CO2, BUN, CREATININE, GLUCOSE, CALCIUM in the last 72 hours. No results for input(s): LABPT, INR in the last 72 hours.   EXAM General - Patient is Alert, Appropriate and Oriented Extremity - Neurovascular intact able to make thumbs up, ok sign, and criss cross 2nd and third digits Dressing/Incision -clean, dry, no drainage, polar care in place  Motor Function - intact, moving hand well on exam Cardiovascular- Regular rate and rhythm, no murmurs/rubs/gallops Respiratory- Lungs clear to auscultation bilaterally Gastrointestinal- soft and nontender   Assessment/Plan: 1 Day Post-Op Procedure(s) (LRB): REVERSE SHOULDER ARTHROPLASTY (Right) Active Problems:   Status post reverse arthroplasty of shoulder, right  Estimated body mass index is 34.12 kg/m as calculated from the following:   Height as of this encounter: 5\' 8"  (1.727 m).    Weight as of 05/25/20: 101.8 kg. Advance diet Up with therapy  Review of patient's vitals shows O2 sat at 86% on room air.  Initiate 2L O2 with nasal cannula. If patient does well with therapy and maintains O2 saturation he will discharge home later today.   Cassell Smiles, PA-C [facility] Orthopaedic Surgery 06/02/2020, 8:19 AM
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
## 2020-06-03 NOTE — Plan of Care (Signed)
Pt alert and oriented, HOH with hearing aid. Pt seen crying in pain at shift change, provider notified and pain med adjustment made. Pain controlled this shift with PRN medications with good relief. Falls precautions remained in plcae. Call bell within reach Problem: Education: Goal: Ipt ndividualized Educational Video(s) Outcome: Progressing   Problem: Activity: Goal: Ability to tolerate increased activity will improve Outcome: Progressing   Problem: Pain Management: Goal: Pain level will decrease with appropriate interventions Outcome: Progressing

## 2020-06-03 NOTE — Progress Notes (Signed)
Occupational Therapy Treatment Patient Details Name: Ryan Ali MRN: [id number] DOB: Oct 24, 1940 Today's Date: 06/03/2020    History of present illness s/p R reverse TSA (06/01/20), NWB, immobilized post-surgery. Recent L foot fracture resulting in need for post-op shoe for all weight bearing.   OT comments  Pt seen for OT treatment this date to f/u re: safety with ADLs/ADL mobility. OT facilitates follow up education re: sling mgt, polar care mgt, sleep positions/considerations, safety and fall prevention considerations. OT engages pt in fxl mobility to restroom with CGA/SUPV, commode transfers with CGA/SUPV with use of grab bars. Pt able to perform peri care with SETUP and sit/lateral lean technique. Pt performs fxl mobility back to chair. Requires MOD A for sling and polar care adjusting and MOD A for LB dressing to don/doff boot. Pt left in chair with all needs met and RN presenting to d/c him. Anticipate pt could benefit from [facility] versus OPOT once cleared by orthopedic physician.    Follow Up Recommendations  Follow surgeon's recommendation for DC plan and follow-up therapies    Equipment Recommendations  None recommended by OT;Other (comment) (loftstrand crutch to L UE)    Recommendations for Other Services      Precautions / Restrictions Precautions Precautions: Fall Restrictions Weight Bearing Restrictions: Yes RUE Weight Bearing: Non weight bearing       Mobility Bed Mobility Overal bed mobility: Modified Independent                  Transfers Overall transfer level: Needs assistance Equipment used: Lofstrands Transfers: Sit to/from Stand Sit to Stand: Min guard         General transfer comment: slightly impulsive and minor balance issues d/t fx foot with boot, but overall able to manage for short functional distances and standing.    Balance Overall balance assessment: Needs assistance Sitting-balance support: No upper extremity supported;Feet  supported Sitting balance-Leahy Scale: Good     Standing balance support: Single extremity supported Standing balance-Leahy Scale: Fair Standing balance comment: single UE support                           ADL either performed or assessed with clinical judgement   ADL Overall ADL's : Needs assistance/impaired     Grooming: Wash/dry hands;Standing;Supervision/safety;Min guard Grooming Details (indicate cue type and reason): sink-side with loft stand crutch                 Toilet Transfer: Min guard;Grab bars [employer] technical [employer] Details (indicate cue type and reason): amb to bathroom, limited tolerance for loft stand crutch to L UE as pt with IV in that forearm Toileting- Clothing Manipulation and Hygiene: Supervision/safety;Sitting/lateral lean       Functional mobility during ADLs: Supervision/safety;Min guard (hand held assist with amb to restroom as pt with limited tolerance for loft stand crutch this date 2/2 IV in hi sL forearm.)       Vision Baseline Vision/History: Glaucoma Patient Visual Report: No change from baseline     Perception     Praxis      Cognition Arousal/Alertness: Awake/alert Behavior During Therapy: WFL for tasks assessed/performed Overall Cognitive Status: Within Functional Limits for tasks assessed                                 General Comments: mild impairment in safety and awareness of deficits, but overall appropriate and follows  commands.        Exercises Other Exercises Other Exercises: OT facilitates follow up education re: sling mgt, polar care mgt, sleep positions/considerations, safety and fall prevention considerations. OT engages pt in fxl mobility to restroom with CGA/SUPV, commode transfers with CGA/SUPV with use of grab bars. Pt able to perform peri care with SETUP and sit/lateral lean technique. Pt performs fxl mobility back to chair. Requires MOD A for sling and polar care adjusting and MOD A for LB  dressing to don/doff boot. Pt left in chair with RN presenting to d/c him.   Shoulder Instructions       General Comments      Pertinent Vitals/ Pain       Pain Assessment: Faces Faces Pain Scale: Hurts a little bit Pain Location: R shoulder Pain Descriptors / Indicators: Grimacing;Sore Pain Intervention(s): Monitored during session;Repositioned (adjusted positioning of sling)  Home Living                                          Prior Functioning/Environment              Frequency  Min 2X/week        Progress Toward Goals  OT Goals(current goals can now be found in the care plan section)  Progress towards OT goals: Progressing toward goals  Acute Rehab OT Goals Patient Stated Goal: to go home OT Goal Formulation: With patient/family Time For Goal Achievement: 06/16/20 Potential to Achieve Goals: Good  Plan      Co-evaluation                 AM-PAC OT "6 Clicks" Daily Activity     Outcome Measure   Help from another person eating meals?: A Little Help from another person taking care of personal grooming?: A Little Help from another person toileting, which includes using toliet, bedpan, or urinal?: A Little Help from another person bathing (including washing, rinsing, drying)?: A Lot Help from another person to put on and taking off regular upper body clothing?: A Lot Help from another person to put on and taking off regular lower body clothing?: A Lot 6 Click Score: 15    End of Session Equipment Utilized During Treatment: Gait belt;Oxygen  OT Visit Diagnosis: Other abnormalities of gait and mobility (R26.89);Muscle weakness (generalized) (M62.81);Pain Pain - Right/Left: Right Pain - part of body: Shoulder   Activity Tolerance Patient tolerated treatment well   Patient Left in chair;with call bell/phone within reach;with family/visitor present;Other (comment)   Nurse Communication Mobility status;Patient requests pain meds         Time: 4496-7591 OT Time Calculation (min): 30 min  Charges: OT General Charges $OT Visit: 1 Visit OT Treatments $Self Care/Home Management : 8-22 mins $Therapeutic Activity: 8-22 mins  Gerrianne Scale, MS, OTR/L ascom [phone] 06/03/20, 3:50 PM

## 2020-06-03 NOTE — Progress Notes (Signed)
  Subjective: 2 Days Post-Op Procedure(s) (LRB): REVERSE SHOULDER ARTHROPLASTY (Right) Patient reports pain as well-controlled.  Does report significant pain this AM. Patient is well, and has had no acute complaints or problems Plan is to go Home after hospital stay. Negative for chest pain and shortness of breath Fever: no Gastrointestinal: negative for nausea and vomiting.   Patient has not had a bowel movement.  Objective: Vital signs in last 24 hours: Temp:  [97.3 F (36.3 C)-98.2 F (36.8 C)] 98.2 F (36.8 C) (03/12 0818) Pulse Rate:  [41-98] 87 (03/12 0818) Resp:  [16-20] 17 (03/12 0818) BP: (106-152)/(65-82) 112/65 (03/12 0818) SpO2:  [92 %-99 %] 95 % (03/12 0818)  Intake/Output from previous day:  Intake/Output Summary (Last 24 hours) at 06/03/2020 1010 Last data filed at 06/02/2020 1800 Gross per 24 hour  Intake 0 ml  Output --  Net 0 ml    Intake/Output this shift: No intake/output data recorded.  Labs: No results for input(s): HGB in the last 72 hours. No results for input(s): WBC, RBC, HCT, PLT in the last 72 hours. No results for input(s): NA, K, CL, CO2, BUN, CREATININE, GLUCOSE, CALCIUM in the last 72 hours. No results for input(s): LABPT, INR in the last 72 hours.   EXAM General - Patient is Alert, Appropriate and Oriented Extremity - Polar Care in place and working, shoulder immobilizer on  Dressing/Incision -clean, dry, no drainage Motor Function - intact, moving hand and fingers well on exam, able to make all signs, n/v intact  Cardiovascular- Regular rate and rhythm, no murmurs/rubs/gallops Respiratory- Lungs clear to auscultation bilaterally    Assessment/Plan: 2 Days Post-Op Procedure(s) (LRB): REVERSE SHOULDER ARTHROPLASTY (Right) Active Problems:   Status post reverse arthroplasty of shoulder, right  Estimated body mass index is 34.12 kg/m as calculated from the following:   Height as of this encounter: 5\' 8"  (1.727 m).   Weight as of  05/25/20: 101.8 kg. Advance diet Up with therapy  Plan for discharge home later today.  Will d/c home with nasal cannula. [place] will follow-up with PCP to reassess optimal COPD regimen   Cassell Smiles, PA-C [facility] 06/03/2020, 10:10 AM

## 2020-06-03 NOTE — Progress Notes (Signed)
PT Cancellation Note  Patient Details Name: ZIMIR KITTLESON MRN: [id number] DOB: 11-29-1940   Cancelled Treatment:    Reason Eval/Treat Not Completed: Other (comment). Has met all PT goals. Discussed with patient and he reports he feels successful and ready to dc this date. Updated RN. Will have follow up HHPT.   Ray,Stephanie 06/03/2020, 10:26 AM Greggory Stallion, PT, DPT [phone]

## 2020-06-03 NOTE — Progress Notes (Signed)
Pt discharged home with wife and daughter. Went over discharge instructions with pt and wife and they both showed understanding. Took all belongings and oxygen home. Wheeled down by tech and nurse and helped into wife's vehicle. IV taken out of right forearm without complications.

## 2020-06-04 DIAGNOSIS — C9000 Multiple myeloma not having achieved remission: Secondary | ICD-10-CM | POA: Diagnosis not present

## 2020-06-04 DIAGNOSIS — K219 Gastro-esophageal reflux disease without esophagitis: Secondary | ICD-10-CM | POA: Diagnosis not present

## 2020-06-04 DIAGNOSIS — Z471 Aftercare following joint replacement surgery: Secondary | ICD-10-CM | POA: Diagnosis not present

## 2020-06-04 DIAGNOSIS — K5900 Constipation, unspecified: Secondary | ICD-10-CM | POA: Diagnosis not present

## 2020-06-04 DIAGNOSIS — J449 Chronic obstructive pulmonary disease, unspecified: Secondary | ICD-10-CM | POA: Diagnosis not present

## 2020-06-04 DIAGNOSIS — H409 Unspecified glaucoma: Secondary | ICD-10-CM | POA: Diagnosis not present

## 2020-06-04 DIAGNOSIS — Z7982 Long term (current) use of aspirin: Secondary | ICD-10-CM | POA: Diagnosis not present

## 2020-06-04 DIAGNOSIS — E079 Disorder of thyroid, unspecified: Secondary | ICD-10-CM | POA: Diagnosis not present

## 2020-06-04 DIAGNOSIS — M47812 Spondylosis without myelopathy or radiculopathy, cervical region: Secondary | ICD-10-CM | POA: Diagnosis not present

## 2020-06-04 DIAGNOSIS — Z96611 Presence of right artificial shoulder joint: Secondary | ICD-10-CM | POA: Diagnosis not present

## 2020-06-04 DIAGNOSIS — R32 Unspecified urinary incontinence: Secondary | ICD-10-CM | POA: Diagnosis not present

## 2020-06-04 DIAGNOSIS — N429 Disorder of prostate, unspecified: Secondary | ICD-10-CM | POA: Diagnosis not present

## 2020-06-04 DIAGNOSIS — Z7951 Long term (current) use of inhaled steroids: Secondary | ICD-10-CM | POA: Diagnosis not present

## 2020-06-04 DIAGNOSIS — Z9481 Bone marrow transplant status: Secondary | ICD-10-CM | POA: Diagnosis not present

## 2020-06-05 DIAGNOSIS — Z471 Aftercare following joint replacement surgery: Secondary | ICD-10-CM | POA: Diagnosis not present

## 2020-06-06 DIAGNOSIS — R2681 Unsteadiness on feet: Secondary | ICD-10-CM | POA: Diagnosis not present

## 2020-06-06 NOTE — Anesthesia Postprocedure Evaluation (Signed)
Anesthesia Post Note  Patient: Ryan Ali  Procedure(s) Performed: REVERSE SHOULDER ARTHROPLASTY (Right Shoulder)  Patient location during evaluation: PACU Anesthesia Type: General Level of consciousness: awake and alert and oriented Pain management: pain level controlled Vital Signs Assessment: post-procedure vital signs reviewed and stable Respiratory status: spontaneous breathing Cardiovascular status: blood pressure returned to baseline Anesthetic complications: no   No complications documented.   Last Vitals:  Vitals:   06/03/20 0755 06/03/20 0818  BP: 121/74 112/65  Pulse: (!) 41 87  Resp: 16 17  Temp: 36.7 C 36.8 C  SpO2: 92% 95%    Last Pain:  Vitals:   06/03/20 0950  TempSrc:   PainSc: 2                  Shaquel Josephson

## 2020-06-15 ENCOUNTER — Other Ambulatory Visit: Payer: Self-pay | Admitting: Family Medicine

## 2020-06-15 DIAGNOSIS — E039 Hypothyroidism, unspecified: Secondary | ICD-10-CM

## 2020-06-19 ENCOUNTER — Telehealth: Payer: Self-pay

## 2020-06-19 DIAGNOSIS — M25611 Stiffness of right shoulder, not elsewhere classified: Secondary | ICD-10-CM | POA: Diagnosis not present

## 2020-06-19 DIAGNOSIS — M25511 Pain in right shoulder: Secondary | ICD-10-CM | POA: Diagnosis not present

## 2020-06-19 DIAGNOSIS — M6281 Muscle weakness (generalized): Secondary | ICD-10-CM | POA: Diagnosis not present

## 2020-06-19 DIAGNOSIS — Z96611 Presence of right artificial shoulder joint: Secondary | ICD-10-CM | POA: Diagnosis not present

## 2020-06-19 NOTE — Telephone Encounter (Signed)
Copied from [place] [id number]. Topic: General - Other >> Jun 19, 2020  3:21 PM Wynetta Emery, [place] C wrote: Reason for CRM: pt's spouse is calling in for assistance. Spouse says that pt had to have oxygen in order to be released from the hospital. Pt would like for provider to provider [place] with a release form so that they will pick up the oxygen from the pt's home.    Jon Billings fax: [phone] -- please assist.

## 2020-06-20 DIAGNOSIS — S92325D Nondisplaced fracture of second metatarsal bone, left foot, subsequent encounter for fracture with routine healing: Secondary | ICD-10-CM | POA: Diagnosis not present

## 2020-06-23 ENCOUNTER — Encounter: Payer: Self-pay | Admitting: Family Medicine

## 2020-06-26 DIAGNOSIS — M25611 Stiffness of right shoulder, not elsewhere classified: Secondary | ICD-10-CM | POA: Diagnosis not present

## 2020-06-26 DIAGNOSIS — M25511 Pain in right shoulder: Secondary | ICD-10-CM | POA: Diagnosis not present

## 2020-06-26 DIAGNOSIS — M6281 Muscle weakness (generalized): Secondary | ICD-10-CM | POA: Diagnosis not present

## 2020-06-26 DIAGNOSIS — Z96611 Presence of right artificial shoulder joint: Secondary | ICD-10-CM | POA: Diagnosis not present

## 2020-06-27 ENCOUNTER — Other Ambulatory Visit: Payer: Self-pay

## 2020-06-27 ENCOUNTER — Ambulatory Visit (INDEPENDENT_AMBULATORY_CARE_PROVIDER_SITE_OTHER): Payer: PPO | Admitting: Family Medicine

## 2020-06-27 VITALS — BP 100/65 | HR 72 | Ht 67.0 in | Wt 224.0 lb

## 2020-06-27 DIAGNOSIS — M5030 Other cervical disc degeneration, unspecified cervical region: Secondary | ICD-10-CM | POA: Diagnosis not present

## 2020-06-27 DIAGNOSIS — Z96611 Presence of right artificial shoulder joint: Secondary | ICD-10-CM | POA: Diagnosis not present

## 2020-06-27 DIAGNOSIS — I714 Abdominal aortic aneurysm, without rupture, unspecified: Secondary | ICD-10-CM

## 2020-06-27 DIAGNOSIS — N401 Enlarged prostate with lower urinary tract symptoms: Secondary | ICD-10-CM | POA: Diagnosis not present

## 2020-06-27 DIAGNOSIS — R7303 Prediabetes: Secondary | ICD-10-CM | POA: Diagnosis not present

## 2020-06-27 DIAGNOSIS — R351 Nocturia: Secondary | ICD-10-CM

## 2020-06-27 DIAGNOSIS — R350 Frequency of micturition: Secondary | ICD-10-CM

## 2020-06-27 DIAGNOSIS — D696 Thrombocytopenia, unspecified: Secondary | ICD-10-CM

## 2020-06-27 DIAGNOSIS — K760 Fatty (change of) liver, not elsewhere classified: Secondary | ICD-10-CM | POA: Diagnosis not present

## 2020-06-27 DIAGNOSIS — J449 Chronic obstructive pulmonary disease, unspecified: Secondary | ICD-10-CM | POA: Diagnosis not present

## 2020-06-27 LAB — POCT GLYCOSYLATED HEMOGLOBIN (HGB A1C)
Estimated Average Glucose: 134
Hemoglobin A1C: 6.3 % — AB (ref 4.0–5.6)

## 2020-06-27 MED ORDER — TAMSULOSIN HCL 0.4 MG PO CAPS: 0.4000 mg | ORAL_CAPSULE | Freq: Every day | ORAL | 4 refills | Status: DC

## 2020-06-27 NOTE — Progress Notes (Signed)
Established patient visit   Patient: Ryan Ali   DOB: January 23, 1941   80 y.o. Male  MRN: [id number] Visit Date: 06/27/2020  Today's healthcare provider: [name], MD   No chief complaint on file.  Subjective    HPI  Patient presents to get an order fax so that oxygen tank can be picked up from house. Patient states he never used oxygen outside of hospital.   Hypothyroid, follow-up  Lab Results  Component Value Date   TSH 3.980 11/01/2019   TSH 1.270 12/14/2018   TSH 0.357 (L) 10/02/2018   FREET4 1.34 12/14/2018   T4TOTAL 9.1 07/17/2015   Wt Readings from Last 3 Encounters:  06/27/20 224 lb (101.6 kg)  05/25/20 224 lb 6.9 oz (101.8 kg)  03/27/20 (P) 229 lb 4.5 oz (104 kg)    He was last seen for hypothyroid 8 months ago.  Management since that visit includes none. He reports excellent compliance with treatment. He is not having side effects.   Symptoms: No change in energy level No constipation  No diarrhea Yes heat / cold intolerance - cold intolerance  No nervousness No palpitations  No weight changes    ----------------------------------------------------------------------------------------- Prediabetes follow up  Lab Results  Component Value Date   HGBA1C 7.0 (H) 11/01/2019   HGBA1C 6.4 (H) 05/16/2015   HGBA1C 6.3 (A) 03/29/2014   Wt Readings from Last 3 Encounters:  06/27/20 224 lb (101.6 kg)  05/25/20 224 lb 6.9 oz (101.8 kg)  03/27/20 (P) 229 lb 4.5 oz (104 kg)   Last seen for prediabetes 8 months ago.  Management since then includes none. Work on diet and exercise. He reports n/a compliance with treatment. He is not having side effects.  Symptoms: No fatigue No foot ulcerations  No appetite changes No nausea  No paresthesia of the feet - but does in left hand No polydipsia  No polyuria No visual disturbances   No vomiting     Home blood sugar records: n/a  Episodes of hypoglycemia? No    Current insulin regiment:  n/a Most Recent Eye Exam: n/a Current exercise: none Current diet habits: well balanced  Pertinent Labs: Lab Results  Component Value Date   CHOL 184 05/16/2015   HDL 44 05/16/2015   LDLCALC 120 (H) 05/16/2015   TRIG 102 05/16/2015   CHOLHDL 4.2 05/16/2015   Lab Results  Component Value Date   NA 139 05/25/2020   K 4.4 05/25/2020   CREATININE 1.13 05/25/2020   GFRNONAA >60 05/25/2020   GFRAA 77 11/01/2019   GLUCOSE 202 (H) 05/25/2020     ---------------------------------------------------------------------------------------------------      Medications: Outpatient Medications Prior to Visit  Medication Sig  . albuterol (VENTOLIN HFA) 108 (90 Base) MCG/ACT inhaler Inhale 2 puffs into the lungs every 6 (six) hours as needed for shortness of breath or wheezing.  . Ascorbic Acid (VITAMIN C PO) Take 1,400 mg by mouth daily.  . ASTAXANTHIN PO Take 12 mg by mouth daily.  Marland Kitchen atenolol (TENORMIN) 25 MG tablet Take 1 tablet (25 mg total) by mouth every morning.  . baclofen (LIORESAL) 10 MG tablet Take 1 tablet (10 mg total) by mouth every 12 (twelve) hours. (Patient taking differently: Take 10 mg by mouth at bedtime.)  . brimonidine (ALPHAGAN) 0.2 % ophthalmic solution Place 1 drop into the right eye daily.  . budesonide-formoterol (SYMBICORT) 160-4.5 MCG/ACT inhaler Inhale 2 puffs into the lungs 2 (two) times daily.  . Calcium Carb-Cholecalciferol 600-800 MG-UNIT  CHEW Chew 1 each by mouth daily.  . Calcium Carbonate-Vit D-Min (CALCIUM 600+D PLUS MINERALS) 600-400 MG-UNIT CHEW Chew 3 tablets by mouth daily.  . Cholecalciferol (VITAMIN D PO) Take 1 tablet by mouth daily.  . ferrous sulfate 325 (65 FE) MG tablet Take 325 mg by mouth daily with breakfast.  . Fluticasone-Salmeterol (ADVAIR) 250-50 MCG/DOSE AEPB Inhale 2 puffs into the lungs every morning.  Marland Kitchen HYDROcodone-acetaminophen (NORCO/VICODIN) 5-325 MG tablet Take 1 tablet by mouth every 6 (six) hours as needed for moderate pain.  Marland Kitchen  levothyroxine (SYNTHROID) 150 MCG tablet TAKE 1 TABLET(150 MCG) BY MOUTH DAILY  . magnesium oxide (MAG-OX) 400 MG tablet Take 400 mg by mouth every morning.  Marland Kitchen omeprazole (PRILOSEC) 20 MG capsule Take 40 mg by mouth every morning.  Marland Kitchen PAPAYA ENZYME PO Take 600 mg by mouth daily.  Marland Kitchen tiotropium (SPIRIVA) 18 MCG inhalation capsule Place 18 mcg into inhaler and inhale 2 (two) times daily.  . Turmeric 500 MG CAPS Take 500 mg by mouth daily.  . [DISCONTINUED] furosemide (LASIX) 20 MG tablet Take 1 tablet (20 mg total) by mouth daily as needed for edema. (Patient not taking: Reported on 06/27/2020)  . [DISCONTINUED] oxyCODONE (ROXICODONE) 5 MG immediate release tablet Take 1-2 tablets (5-10 mg total) by mouth every 4 (four) hours as needed for moderate pain or severe pain. (Patient not taking: Reported on 06/27/2020)   No facility-administered medications prior to visit.    Review of Systems     Objective    BP 100/65 (BP Location: Right Arm, Patient Position: Sitting, Cuff Size: Large)   Pulse 72   Ht 5\' 7"  (1.702 m)   Wt 224 lb (101.6 kg)   SpO2 98%   BMI 35.08 kg/m     Physical Exam    General: Appearance:    Obese male in no acute distress  Eyes:    PERRL, conjunctiva/corneas clear, EOM's intact       Lungs:     Clear to auscultation bilaterally, respirations unlabored  Heart:    Normal heart rate. Normal rhythm. No murmurs, rubs, or gallops.   MS:   All extremities are intact.   Neurologic:   Awake, alert, oriented x 3. No apparent focal neurological           defect.         Results for orders placed or performed in visit on 06/27/20  POCT HgB A1C  Result Value Ref Range   Hemoglobin A1C 6.3 (A) 4.0 - 5.6 %   Estimated Average Glucose 134     Assessment & Plan     1. Prediabetes Diet controlled, encouraged healthier eating habits and weight loss.   2. Hepatic steatosis Reports has had u/s at [facility]   3. Thrombocytopenia ([place]) Likely hypersplenism secondary to hepatic  steatosis.   4. Urinary frequency refill- tamsulosin (FLOMAX) 0.4 MG CAPS capsule; Take 1 capsule (0.4 mg total) by mouth daily.  Dispense: 90 capsule; Refill: 4  5. Benign prostatic hyperplasia with nocturia   6. Chronic obstructive pulmonary disease, unspecified COPD type ([place]) Doing well with current inhalers.   7. Abdominal aneurysm ([place]) Continue routine follow up Dr. Delana Meyer.   8. DDD (degenerative disc disease), cervical   9. Status post reverse arthroplasty of shoulder, right Recovering well, taking occasional- HYDROcodone-acetaminophen (NORCO/VICODIN) 5-325 MG tablet; Take 1 tablet by mouth every 6 (six) hours as needed for moderate pain.        The entirety of the information documented  in the History of Present Illness, Review of Systems and Physical Exam were personally obtained by me. Portions of this information were initially documented by the CMA and reviewed by me for thoroughness and accuracy.      [name], MD  [facility] [phone] (phone) [phone] (fax)  [place]

## 2020-06-27 NOTE — Patient Instructions (Signed)
.   Please review the attached list of medications and notify my office if there are any errors.   . Please bring all of your medications to every appointment so we can make sure that our medication list is the same as yours.   

## 2020-06-28 DIAGNOSIS — M25611 Stiffness of right shoulder, not elsewhere classified: Secondary | ICD-10-CM | POA: Diagnosis not present

## 2020-06-28 DIAGNOSIS — M6281 Muscle weakness (generalized): Secondary | ICD-10-CM | POA: Diagnosis not present

## 2020-06-28 DIAGNOSIS — M25511 Pain in right shoulder: Secondary | ICD-10-CM | POA: Diagnosis not present

## 2020-06-28 DIAGNOSIS — Z96611 Presence of right artificial shoulder joint: Secondary | ICD-10-CM | POA: Diagnosis not present

## 2020-07-04 DIAGNOSIS — M25511 Pain in right shoulder: Secondary | ICD-10-CM | POA: Diagnosis not present

## 2020-07-04 DIAGNOSIS — M25611 Stiffness of right shoulder, not elsewhere classified: Secondary | ICD-10-CM | POA: Diagnosis not present

## 2020-07-04 DIAGNOSIS — Z96611 Presence of right artificial shoulder joint: Secondary | ICD-10-CM | POA: Diagnosis not present

## 2020-07-06 ENCOUNTER — Ambulatory Visit (INDEPENDENT_AMBULATORY_CARE_PROVIDER_SITE_OTHER): Payer: PPO | Admitting: Vascular Surgery

## 2020-07-06 ENCOUNTER — Other Ambulatory Visit (INDEPENDENT_AMBULATORY_CARE_PROVIDER_SITE_OTHER): Payer: PPO

## 2020-07-12 DIAGNOSIS — M25611 Stiffness of right shoulder, not elsewhere classified: Secondary | ICD-10-CM | POA: Diagnosis not present

## 2020-07-12 DIAGNOSIS — Z96611 Presence of right artificial shoulder joint: Secondary | ICD-10-CM | POA: Diagnosis not present

## 2020-07-12 DIAGNOSIS — M6281 Muscle weakness (generalized): Secondary | ICD-10-CM | POA: Diagnosis not present

## 2020-07-12 DIAGNOSIS — M25511 Pain in right shoulder: Secondary | ICD-10-CM | POA: Diagnosis not present

## 2020-07-14 DIAGNOSIS — Z96611 Presence of right artificial shoulder joint: Secondary | ICD-10-CM | POA: Diagnosis not present

## 2020-07-17 DIAGNOSIS — M25611 Stiffness of right shoulder, not elsewhere classified: Secondary | ICD-10-CM | POA: Diagnosis not present

## 2020-07-17 DIAGNOSIS — M6281 Muscle weakness (generalized): Secondary | ICD-10-CM | POA: Diagnosis not present

## 2020-07-17 DIAGNOSIS — M25511 Pain in right shoulder: Secondary | ICD-10-CM | POA: Diagnosis not present

## 2020-07-17 DIAGNOSIS — Z96611 Presence of right artificial shoulder joint: Secondary | ICD-10-CM | POA: Diagnosis not present

## 2020-07-19 DIAGNOSIS — Z96611 Presence of right artificial shoulder joint: Secondary | ICD-10-CM | POA: Diagnosis not present

## 2020-07-19 DIAGNOSIS — M25511 Pain in right shoulder: Secondary | ICD-10-CM | POA: Diagnosis not present

## 2020-07-19 DIAGNOSIS — M6281 Muscle weakness (generalized): Secondary | ICD-10-CM | POA: Diagnosis not present

## 2020-07-19 DIAGNOSIS — M25611 Stiffness of right shoulder, not elsewhere classified: Secondary | ICD-10-CM | POA: Diagnosis not present

## 2020-07-24 ENCOUNTER — Other Ambulatory Visit: Payer: Self-pay | Admitting: Family Medicine

## 2020-07-24 DIAGNOSIS — R350 Frequency of micturition: Secondary | ICD-10-CM

## 2020-07-24 DIAGNOSIS — Z96611 Presence of right artificial shoulder joint: Secondary | ICD-10-CM | POA: Diagnosis not present

## 2020-07-24 DIAGNOSIS — M25611 Stiffness of right shoulder, not elsewhere classified: Secondary | ICD-10-CM | POA: Diagnosis not present

## 2020-07-24 DIAGNOSIS — M25511 Pain in right shoulder: Secondary | ICD-10-CM | POA: Diagnosis not present

## 2020-07-28 DIAGNOSIS — H401131 Primary open-angle glaucoma, bilateral, mild stage: Secondary | ICD-10-CM | POA: Diagnosis not present

## 2020-08-01 DIAGNOSIS — H401111 Primary open-angle glaucoma, right eye, mild stage: Secondary | ICD-10-CM | POA: Diagnosis not present

## 2020-08-02 DIAGNOSIS — M25611 Stiffness of right shoulder, not elsewhere classified: Secondary | ICD-10-CM | POA: Diagnosis not present

## 2020-08-02 DIAGNOSIS — Z96611 Presence of right artificial shoulder joint: Secondary | ICD-10-CM | POA: Diagnosis not present

## 2020-08-02 DIAGNOSIS — M25511 Pain in right shoulder: Secondary | ICD-10-CM | POA: Diagnosis not present

## 2020-08-02 DIAGNOSIS — M6281 Muscle weakness (generalized): Secondary | ICD-10-CM | POA: Diagnosis not present

## 2020-08-07 DIAGNOSIS — M25611 Stiffness of right shoulder, not elsewhere classified: Secondary | ICD-10-CM | POA: Diagnosis not present

## 2020-08-07 DIAGNOSIS — M25511 Pain in right shoulder: Secondary | ICD-10-CM | POA: Diagnosis not present

## 2020-08-07 DIAGNOSIS — M6281 Muscle weakness (generalized): Secondary | ICD-10-CM | POA: Diagnosis not present

## 2020-08-07 DIAGNOSIS — Z96611 Presence of right artificial shoulder joint: Secondary | ICD-10-CM | POA: Diagnosis not present

## 2020-08-08 ENCOUNTER — Other Ambulatory Visit (INDEPENDENT_AMBULATORY_CARE_PROVIDER_SITE_OTHER): Payer: Self-pay | Admitting: Vascular Surgery

## 2020-08-08 DIAGNOSIS — I714 Abdominal aortic aneurysm, without rupture, unspecified: Secondary | ICD-10-CM
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
## 2020-08-09 DIAGNOSIS — M25511 Pain in right shoulder: Secondary | ICD-10-CM | POA: Diagnosis not present

## 2020-08-09 DIAGNOSIS — M25611 Stiffness of right shoulder, not elsewhere classified: Secondary | ICD-10-CM | POA: Diagnosis not present

## 2020-08-09 DIAGNOSIS — Z96611 Presence of right artificial shoulder joint: Secondary | ICD-10-CM | POA: Diagnosis not present

## 2020-08-10 ENCOUNTER — Ambulatory Visit (INDEPENDENT_AMBULATORY_CARE_PROVIDER_SITE_OTHER): Payer: PPO

## 2020-08-10 ENCOUNTER — Ambulatory Visit (INDEPENDENT_AMBULATORY_CARE_PROVIDER_SITE_OTHER): Payer: PPO | Admitting: Vascular Surgery

## 2020-08-10 ENCOUNTER — Other Ambulatory Visit: Payer: Self-pay

## 2020-08-10 ENCOUNTER — Encounter (INDEPENDENT_AMBULATORY_CARE_PROVIDER_SITE_OTHER): Payer: Self-pay | Admitting: Vascular Surgery

## 2020-08-10 VITALS — BP 120/71 | HR 74 | Resp 16 | Wt 229.6 lb

## 2020-08-10 DIAGNOSIS — I713 Abdominal aortic aneurysm, ruptured, unspecified: Secondary | ICD-10-CM

## 2020-08-10 DIAGNOSIS — I714 Abdominal aortic aneurysm, without rupture, unspecified: Secondary | ICD-10-CM

## 2020-08-10 DIAGNOSIS — I70213 Atherosclerosis of native arteries of extremities with intermittent claudication, bilateral legs: Secondary | ICD-10-CM | POA: Diagnosis not present

## 2020-08-10 DIAGNOSIS — I10 Essential (primary) hypertension: Secondary | ICD-10-CM | POA: Insufficient documentation

## 2020-08-10 DIAGNOSIS — R918 Other nonspecific abnormal finding of lung field: Secondary | ICD-10-CM | POA: Insufficient documentation

## 2020-08-10 DIAGNOSIS — F17201 Nicotine dependence, unspecified, in remission: Secondary | ICD-10-CM | POA: Insufficient documentation

## 2020-08-10 DIAGNOSIS — M1990 Unspecified osteoarthritis, unspecified site: Secondary | ICD-10-CM

## 2020-08-10 DIAGNOSIS — E039 Hypothyroidism, unspecified: Secondary | ICD-10-CM | POA: Insufficient documentation

## 2020-08-10 DIAGNOSIS — J449 Chronic obstructive pulmonary disease, unspecified: Secondary | ICD-10-CM

## 2020-08-10 DIAGNOSIS — I2699 Other pulmonary embolism without acute cor pulmonale: Secondary | ICD-10-CM | POA: Insufficient documentation

## 2020-08-10 DIAGNOSIS — E119 Type 2 diabetes mellitus without complications: Secondary | ICD-10-CM

## 2020-08-10 DIAGNOSIS — K760 Fatty (change of) liver, not elsewhere classified: Secondary | ICD-10-CM | POA: Insufficient documentation

## 2020-08-10 DIAGNOSIS — H409 Unspecified glaucoma: Secondary | ICD-10-CM | POA: Insufficient documentation

## 2020-08-10 DIAGNOSIS — E785 Hyperlipidemia, unspecified: Secondary | ICD-10-CM | POA: Insufficient documentation

## 2020-08-10 HISTORY — DX: Type 2 diabetes mellitus without complications: E11.9

## 2020-08-14 DIAGNOSIS — M25611 Stiffness of right shoulder, not elsewhere classified: Secondary | ICD-10-CM | POA: Diagnosis not present

## 2020-08-14 DIAGNOSIS — M6281 Muscle weakness (generalized): Secondary | ICD-10-CM | POA: Diagnosis not present

## 2020-08-14 DIAGNOSIS — Z96611 Presence of right artificial shoulder joint: Secondary | ICD-10-CM | POA: Diagnosis not present

## 2020-08-14 DIAGNOSIS — M25511 Pain in right shoulder: Secondary | ICD-10-CM | POA: Diagnosis not present

## 2020-08-16 ENCOUNTER — Ambulatory Visit: Payer: Self-pay | Admitting: *Deleted

## 2020-08-16 NOTE — Telephone Encounter (Signed)
Pt and his wife called with complaints of him having problems urinating x 2 day; he is also having burning and pain from straining when attempting to urinate; the pt has been taking Tamsulosin for urinary frequency; recommendations made per nurse triage protocol; the pt's wife verbalized understanding and says he probably will not go to the ED or Urgent Care due to [place]; the pt is seen by Dr Caryn Section at [facility]; they would like a call back from the office and can be contacted at [phone]; will route to office for notification.     Reason for Disposition . [1] Unable to urinate (or only a few drops) > 4 hours AND [2] bladder feels very full (e.g., palpable bladder or strong urge to urinate)  Answer Assessment - Initial Assessment Questions 1. SYMPTOM: "What's the main symptom you're concerned about?" (e.g., frequency, incontinence)     Unable to urinate only a couple of drops 2. ONSET: "When did the    start?"    08/14/20 3. PAIN: "Is there any pain?" If Yes, ask: "How bad is it?" (Scale: 1-10; mild, moderate, severe)     Yes when straining to urinate; rated 2 out of 10 4. CAUSE: "What do you think is causing the symptoms?"    Not sure 5. OTHER SYMPTOMS: "Do you have any other symptoms?" (e.g., fever, flank pain, blood in urine, pain with urination)    Burning with urination 6. PREGNANCY: "Is there any chance you are pregnant?" "When was your last menstrual period?"     n/a  Protocols used: URINARY [facility]

## 2020-08-16 NOTE — Telephone Encounter (Signed)
If he is having difficulty voiding he may have urinary retention and will need to go to ED to get cathed. If the problem is just burning with urination then he will need to provide a urine sample to test for UTI.

## 2020-08-17 NOTE — Telephone Encounter (Signed)
LMTCB, PEC Triage Nurse may give patient results  

## 2020-08-17 NOTE — Telephone Encounter (Signed)
Appointment scheduled for tomorrow 08/18/20 with Dr. Caryn Section.

## 2020-08-18 ENCOUNTER — Encounter: Payer: Self-pay | Admitting: Family Medicine

## 2020-08-18 ENCOUNTER — Ambulatory Visit (INDEPENDENT_AMBULATORY_CARE_PROVIDER_SITE_OTHER): Payer: PPO | Admitting: Family Medicine

## 2020-08-18 ENCOUNTER — Other Ambulatory Visit: Payer: Self-pay

## 2020-08-18 ENCOUNTER — Ambulatory Visit: Payer: PPO | Admitting: Family Medicine

## 2020-08-18 VITALS — BP 95/68 | HR 66 | Temp 97.7°F | Resp 24 | Ht 67.0 in | Wt 227.0 lb

## 2020-08-18 DIAGNOSIS — R3911 Hesitancy of micturition: Secondary | ICD-10-CM | POA: Diagnosis not present

## 2020-08-18 DIAGNOSIS — R300 Dysuria: Secondary | ICD-10-CM | POA: Diagnosis not present

## 2020-08-18 DIAGNOSIS — N3000 Acute cystitis without hematuria: Secondary | ICD-10-CM | POA: Diagnosis not present

## 2020-08-18 LAB — POCT URINALYSIS DIPSTICK
Bilirubin, UA: NEGATIVE
Glucose, UA: NEGATIVE
Ketones, UA: NEGATIVE
Nitrite, UA: POSITIVE
Protein, UA: POSITIVE — AB
Spec Grav, UA: >=1.03 — AB (ref 1.010–1.025)
Urobilinogen, UA: 0.2 E.U./dL
pH, UA: 6 (ref 5.0–8.0)

## 2020-08-18 MED ORDER — CEPHALEXIN 500 MG PO CAPS: 500.0000 mg | ORAL_CAPSULE | Freq: Three times a day (TID) | ORAL | 0 refills | Status: AC

## 2020-08-18 NOTE — Patient Instructions (Addendum)
.   You can go ahead and start back on tamsulosin  . Please bring all of your medications to every appointment so we can make sure that our medication list is the same as yours.    I recommend seeing Dr. Bernardo Heater or [facility] with [facility] for urinary hesitancy.

## 2020-08-18 NOTE — Progress Notes (Signed)
Established patient visit   Patient: Ryan Ali   DOB: [birth year]   80 y.o. Male  MRN: [id number] Visit Date: 08/18/2020  Today's healthcare provider: [name], MD   Chief Complaint  Patient presents with  . Urinary Tract Infection   Subjective    Urinary Tract Infection  The current episode started 1 to 4 weeks ago (3 weeks). The problem has been gradually worsening. The quality of the pain is described as burning. The pain is moderate. There has been no fever. Associated symptoms include frequency, hesitancy, nausea and urgency. Pertinent negatives include no hematuria. He has tried nothing for the symptoms.    He does have history of urinary hesitancy thought due to BPH. Last psa was normal. However he doesn't feel that tamsulosin made much difference.      Medications: Outpatient Medications Prior to Visit  Medication Sig  . albuterol (VENTOLIN HFA) 108 (90 Base) MCG/ACT inhaler Inhale 2 puffs into the lungs every 6 (six) hours as needed for shortness of breath or wheezing.  . Ascorbic Acid (VITAMIN C PO) Take 1,400 mg by mouth daily.  Marland Kitchen aspirin EC 81 MG tablet Take 81 mg by mouth daily. Swallow whole.  . ASTAXANTHIN PO Take 12 mg by mouth daily.  Marland Kitchen atenolol (TENORMIN) 25 MG tablet Take 1 tablet (25 mg total) by mouth every morning.  . baclofen (LIORESAL) 10 MG tablet Take 1 tablet (10 mg total) by mouth every 12 (twelve) hours. (Patient taking differently: Take 10 mg by mouth at bedtime.)  . brimonidine (ALPHAGAN) 0.2 % ophthalmic solution Place 1 drop into the right eye daily.  . budesonide-formoterol (SYMBICORT) 160-4.5 MCG/ACT inhaler Inhale 2 puffs into the lungs 2 (two) times daily.  . Calcium Carb-Cholecalciferol 600-800 MG-UNIT CHEW Chew 1 each by mouth daily.  . Calcium Carbonate-Vit D-Min (CALCIUM 600+D PLUS MINERALS) 600-400 MG-UNIT CHEW Chew 3 tablets by mouth daily.  . Cholecalciferol (VITAMIN D PO) Take 1 tablet by mouth daily.  . ferrous  sulfate 325 (65 FE) MG tablet Take 325 mg by mouth daily with breakfast.  . Fluticasone-Salmeterol (ADVAIR) 250-50 MCG/DOSE AEPB Inhale 2 puffs into the lungs every morning.  Marland Kitchen HYDROcodone-acetaminophen (NORCO/VICODIN) 5-325 MG tablet Take 1 tablet by mouth every 6 (six) hours as needed for moderate pain.  Marland Kitchen levothyroxine (SYNTHROID) 150 MCG tablet TAKE 1 TABLET(150 MCG) BY MOUTH DAILY  . magnesium oxide (MAG-OX) 400 MG tablet Take 400 mg by mouth every morning.  Marland Kitchen omeprazole (PRILOSEC) 20 MG capsule Take 40 mg by mouth every morning.  Marland Kitchen PAPAYA ENZYME PO Take 600 mg by mouth daily.  . tamsulosin (FLOMAX) 0.4 MG CAPS capsule TAKE 1 CAPSULE(0.4 MG) BY MOUTH DAILY  . tiotropium (SPIRIVA) 18 MCG inhalation capsule Place 18 mcg into inhaler and inhale 2 (two) times daily.  . Turmeric 500 MG CAPS Take 500 mg by mouth daily. (Patient not taking: No sig reported)   No facility-administered medications prior to visit.    Review of Systems  Gastrointestinal: Positive for nausea.  Genitourinary: Positive for frequency, hesitancy and urgency. Negative for hematuria.       Objective    BP 95/68   Pulse 66   Temp 97.7 F (36.5 C)   Resp (!) 24   Ht 5\' 7"  (1.702 m)   Wt 227 lb (103 kg)   BMI 35.55 kg/m     Physical Exam   General appearance: Obese male, cooperative and in no acute distress Head: Normocephalic, without  obvious abnormality, atraumatic Respiratory: Respirations even and unlabored, normal respiratory rate Extremities: All extremities are intact.  Skin: Skin color, texture, turgor normal. No rashes seen  Psych: Appropriate mood and affect. Neurologic: Mental status: Alert, oriented to person, place, and time, thought content appropriate.   Results for orders placed or performed in visit on 08/18/20  POCT urinalysis dipstick  Result Value Ref Range   Color, UA amber    Clarity, UA cloudy    Glucose, UA Negative Negative   Bilirubin, UA negative    Ketones, UA negative     Spec Grav, UA >=1.030 (A) 1.010 - 1.025   Blood, UA hemolyzed large    pH, UA 6.0 5.0 - 8.0   Protein, UA Positive (A) Negative   Urobilinogen, UA 0.2 0.2 or 1.0 E.U./dL   Nitrite, UA positive    Leukocytes, UA Moderate (2+) (A) Negative   Odor malodorus     Assessment & Plan     1. Dysuria   2. Urinary hesitancy Doesn't seem to have improved with tamsulosin. Recommend he see urologist. He doesn't think his insurance requires a referral.   3. Acute cystitis without hematuria  - Urine Culture - cephALEXin (KEFLEX) 500 MG capsule; Take 1 capsule (500 mg total) by mouth 3 (three) times daily for 7 days.  Dispense: 21 capsule; Refill: 0        The entirety of the information documented in the History of Present Illness, Review of Systems and Physical Exam were personally obtained by me. Portions of this information were initially documented by the CMA and reviewed by me for thoroughness and accuracy.      [name], MD  [facility] [phone] (phone) [phone] (fax)  [place]

## 2020-08-23 LAB — URINE CULTURE

## 2020-08-23 NOTE — Telephone Encounter (Signed)
This encounter was created in error - please disregard.

## 2020-08-28 ENCOUNTER — Ambulatory Visit: Payer: PPO | Admitting: Family Medicine

## 2020-08-29 ENCOUNTER — Telehealth: Payer: Self-pay

## 2020-08-29 MED ORDER — NITROFURANTOIN MONOHYD MACRO 100 MG PO CAPS: 100.0000 mg | ORAL_CAPSULE | Freq: Two times a day (BID) | ORAL | 0 refills | Status: AC

## 2020-08-29 NOTE — Telephone Encounter (Signed)
Copied from [place] [id number]. Topic: General - Other >> Aug 29, 2020 11:36 AM Leward Quan A wrote: Reason for CRM: Patient wife called in to inform Dr Caryn Section say that patient have completed his antibiotics and now starting to have burning, having frequent urge with very little or no output of urine. Would like a call back from Dr Caryn Section or his nurse to discuss getting more medication  Ph# [phone]

## 2020-08-29 NOTE — Telephone Encounter (Signed)
Have sent prescription for a different antibiotic, but he needs to see urologist ASAP, it's not going to go away otherwise.

## 2020-08-29 NOTE — Telephone Encounter (Signed)
Mrs. Pettis advised.  She states she will call a urologist today.  She says they do not need a referral.   Thanks,   -Mickel Baas

## 2020-09-05 ENCOUNTER — Other Ambulatory Visit
Admission: RE | Admit: 2020-09-05 | Discharge: 2020-09-05 | Disposition: A | Discharge status: Home / Self Care | Payer: PPO | Attending: Urology | Admitting: Urology

## 2020-09-05 ENCOUNTER — Ambulatory Visit: Payer: PPO | Admitting: Urology

## 2020-09-05 ENCOUNTER — Encounter: Payer: Self-pay | Admitting: Urology

## 2020-09-05 ENCOUNTER — Other Ambulatory Visit: Payer: Self-pay

## 2020-09-05 VITALS — BP 131/79 | HR 60 | Ht 65.0 in | Wt 226.0 lb

## 2020-09-05 DIAGNOSIS — N401 Enlarged prostate with lower urinary tract symptoms: Secondary | ICD-10-CM

## 2020-09-05 DIAGNOSIS — N390 Urinary tract infection, site not specified: Secondary | ICD-10-CM

## 2020-09-05 DIAGNOSIS — N138 Other obstructive and reflux uropathy: Secondary | ICD-10-CM | POA: Diagnosis not present

## 2020-09-05 LAB — URINALYSIS, COMPLETE (UACMP) WITH MICROSCOPIC
Bilirubin Urine: NEGATIVE
Glucose, UA: NEGATIVE mg/dL
Hgb urine dipstick: NEGATIVE
Ketones, ur: NEGATIVE mg/dL
Leukocytes,Ua: NEGATIVE
Nitrite: NEGATIVE
Protein, ur: NEGATIVE mg/dL
Specific Gravity, Urine: 1.02 (ref 1.005–1.030)
pH: 7 (ref 5.0–8.0)

## 2020-09-05 LAB — BLADDER SCAN AMB NON-IMAGING

## 2020-09-05 MED ORDER — ALFUZOSIN HCL ER 10 MG PO TB24: 10.0000 mg | ORAL_TABLET | Freq: Every day | ORAL | 6 refills | Status: DC
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
## 2020-09-05 NOTE — Patient Instructions (Signed)
Urinary Tract Infection, Adult A urinary tract infection (UTI) is an infection of any part of the urinary tract. The urinary tract includes: The kidneys. The ureters. The bladder. The urethra. These organs make, store, and get rid of pee (urine) in the body. What are the causes? This infection is caused by germs (bacteria) in your genital area. These germs grow and cause swelling (inflammation) of your urinary tract. What increases the risk? The following factors may make you more likely to develop this condition: Using a small, thin tube (catheter) to drain pee. Not being able to control when you pee or poop (incontinence). Being male. If you are male, these things can increase the risk: Using these methods to prevent pregnancy: A medicine that kills sperm (spermicide). A device that blocks sperm (diaphragm). Having low levels of a male hormone (estrogen). Being pregnant. You are more likely to develop this condition if: You have genes that add to your risk. You are sexually active. You take antibiotic medicines. You have trouble peeing because of: A prostate that is bigger than normal, if you are male. A blockage in the part of your body that drains pee from the bladder. A kidney stone. A nerve condition that affects your bladder. Not getting enough to drink. Not peeing often enough. You have other conditions, such as: Diabetes. A weak disease-fighting system (immune system). Sickle cell disease. Gout. Injury of the spine. What are the signs or symptoms? Symptoms of this condition include: Needing to pee right away. Peeing small amounts often. Pain or burning when peeing. Blood in the pee. Pee that smells bad or not like normal. Trouble peeing. Pee that is cloudy. Fluid coming from the vagina, if you are male. Pain in the belly or lower back. Other symptoms include: Vomiting. Not feeling hungry. Feeling mixed up (confused). This may be the first symptom in  older adults. Being tired and grouchy (irritable). A fever. Watery poop (diarrhea). How is this treated? Taking antibiotic medicine. Taking other medicines. Drinking enough water. In some cases, you may need to see a specialist. Follow these instructions at home:  Medicines Take over-the-counter and prescription medicines only as told by your doctor. If you were prescribed an antibiotic medicine, take it as told by your doctor. Do not stop taking it even if you start to feel better. General instructions Make sure you: Pee until your bladder is empty. Do not hold pee for a long time. Empty your bladder after sex. Wipe from front to back after peeing or pooping if you are a male. Use each tissue one time when you wipe. Drink enough fluid to keep your pee pale yellow. Keep all follow-up visits. Contact a doctor if: You do not get better after 1-2 days. Your symptoms go away and then come back. Get help right away if: You have very bad back pain. You have very bad pain in your lower belly. You have a fever. You have chills. You feeling like you will vomit or you vomit. Summary A urinary tract infection (UTI) is an infection of any part of the urinary tract. This condition is caused by germs in your genital area. There are many risk factors for a UTI. Treatment includes antibiotic medicines. Drink enough fluid to keep your pee pale yellow. This information is not intended to replace advice given to you by your health care provider. Make sure you discuss any questions you have with your healthcare provider. Document Revised: 10/22/2019 Document Reviewed: 10/22/2019 Elsevier Patient Education  2022  Elsevier Inc.   Benign Prostatic Hyperplasia  Benign prostatic hyperplasia (BPH) is an enlarged prostate gland that is caused by the normal aging process and not by cancer. The prostate is a walnut-sized gland that is involved in the production of semen. It is located in front of  the rectum and below the bladder. The bladder stores urine and the urethra is the tube that carries the urine out of the body. The prostate may get bigger asa man gets older. An enlarged prostate can press on the urethra. This can make it harder to pass urine. The build-up of urine in the bladder can cause infection. Back pressure and infection may progress to bladder damage and kidney (renal) failure. What are the causes? This condition is part of a normal aging process. However, not all men develop problems from this condition. If the prostate enlarges away from the urethra, urine flow will not be blocked. If it enlarges toward the urethra andcompresses it, there will be problems passing urine. What increases the risk? This condition is more likely to develop in men over the age of 32 years. What are the signs or symptoms? Symptoms of this condition include: Getting up often during the night to urinate. Needing to urinate frequently during the day. Difficulty starting urine flow. Decrease in size and strength of your urine stream. Leaking (dribbling) after urinating. Inability to pass urine. This needs immediate treatment. Inability to completely empty your bladder. Pain when you pass urine. This is more common if there is also an infection. Urinary tract infection (UTI). How is this diagnosed? This condition is diagnosed based on your medical history, a physical exam, and your symptoms. Tests will also be done, such as: A post-void bladder scan. This measures any amount of urine that may remain in your bladder after you finish urinating. A digital rectal exam. In a rectal exam, your health care provider checks your prostate by putting a lubricated, gloved finger into your rectum to feel the back of your prostate gland. This exam detects the size of your gland and any abnormal lumps or growths. An exam of your urine (urinalysis). A prostate specific antigen (PSA) screening. This is a blood  test used to screen for prostate cancer. An ultrasound. This test uses sound waves to electronically produce a picture of your prostate gland. Your health care provider may refer you to a specialist in kidney and prostate diseases (urologist). How is this treated? Once symptoms begin, your health care provider will monitor your condition (active surveillance or watchful waiting). Treatment for this condition will depend on the severity of your condition. Treatment may include: Observation and yearly exams. This may be the only treatment needed if your condition and symptoms are mild. Medicines to relieve your symptoms, including: Medicines to shrink the prostate. Medicines to relax the muscle of the prostate. Surgery in severe cases. Surgery may include: Prostatectomy. In this procedure, the prostate tissue is removed completely through an open incision or with a laparoscope or robotics. Transurethral resection of the prostate (TURP). In this procedure, a tool is inserted through the opening at the tip of the penis (urethra). It is used to cut away tissue of the inner core of the prostate. The pieces are removed through the same opening of the penis. This removes the blockage. Transurethral incision (TUIP). In this procedure, small cuts are made in the prostate. This lessens the prostate's pressure on the urethra. Transurethral microwave thermotherapy (TUMT). This procedure uses microwaves to create heat. The heat  destroys and removes a small amount of prostate tissue. Transurethral needle ablation (TUNA). This procedure uses radio frequencies to destroy and remove a small amount of prostate tissue. Interstitial laser coagulation ([place]). This procedure uses a laser to destroy and remove a small amount of prostate tissue. Transurethral electrovaporization (TUVP). This procedure uses electrodes to destroy and remove a small amount of prostate tissue. Prostatic urethral lift. This procedure inserts an  implant to push the lobes of the prostate away from the urethra. Follow these instructions at home: Take over-the-counter and prescription medicines only as told by your health care provider. Monitor your symptoms for any changes. Contact your health care provider with any changes. Avoid drinking large amounts of liquid before going to bed or out in public. Avoid or reduce how much caffeine or alcohol you drink. Give yourself time when you urinate. Keep all follow-up visits as told by your health care provider. This is important. Contact a health care provider if: You have unexplained back pain. Your symptoms do not get better with treatment. You develop side effects from the medicine you are taking. Your urine becomes very dark or has a bad smell. Your lower abdomen becomes distended and you have trouble passing your urine. Get help right away if: You have a fever or chills. You suddenly cannot urinate. You feel lightheaded, or very dizzy, or you faint. There are large amounts of blood or clots in the urine. Your urinary problems become hard to manage. You develop moderate to severe low back or flank pain. The flank is the side of your body between the ribs and the hip. These symptoms may represent a serious problem that is an emergency. Do not wait to see if the symptoms will go away. Get medical help right away. Call your local emergency services (911 in the [place] Do not drive yourself to the hospital. Summary Benign prostatic hyperplasia (BPH) is an enlarged prostate that is caused by the normal aging process and not by cancer. An enlarged prostate can press on the urethra. This can make it hard to pass urine. This condition is part of a normal aging process and is more likely to develop in men over the age of 13 years. Get help right away if you suddenly cannot urinate. This information is not intended to replace advice given to you by your health care provider. Make sure you discuss  any questions you have with your healthcare provider. Document Revised: 11/18/2019 Document Reviewed: 11/18/2019 Elsevier Patient Education  [place].

## 2020-09-07 ENCOUNTER — Ambulatory Visit
Admission: RE | Admit: 2020-09-07 | Discharge: 2020-09-07 | Disposition: A | Discharge status: Home / Self Care | Payer: PPO | Source: Ambulatory Visit | Attending: Vascular Surgery | Admitting: Vascular Surgery

## 2020-09-07 ENCOUNTER — Other Ambulatory Visit: Payer: Self-pay

## 2020-09-07 DIAGNOSIS — N200 Calculus of kidney: Secondary | ICD-10-CM | POA: Diagnosis not present

## 2020-09-07 DIAGNOSIS — K449 Diaphragmatic hernia without obstruction or gangrene: Secondary | ICD-10-CM | POA: Diagnosis not present

## 2020-09-07 DIAGNOSIS — I713 Abdominal aortic aneurysm, ruptured, unspecified: Secondary | ICD-10-CM

## 2020-09-07 DIAGNOSIS — J9811 Atelectasis: Secondary | ICD-10-CM | POA: Diagnosis not present

## 2020-09-07 LAB — POCT I-STAT CREATININE: Creatinine, Ser: 1.1 mg/dL (ref 0.61–1.24)

## 2020-09-07 MED ORDER — IOHEXOL 350 MG/ML SOLN
100.0000 mL | Freq: Once | INTRAVENOUS | Status: AC | PRN
  Administered 2020-09-07: 100 mL via INTRAVENOUS

## 2020-09-10 ENCOUNTER — Other Ambulatory Visit: Payer: Self-pay | Admitting: Family Medicine

## 2020-09-10 DIAGNOSIS — E039 Hypothyroidism, unspecified: Secondary | ICD-10-CM

## 2020-09-11 ENCOUNTER — Telehealth: Payer: Self-pay

## 2020-09-11 NOTE — Telephone Encounter (Signed)
Called pt, no answer. LM for pt call back. 1st attempt.

## 2020-09-11 NOTE — Telephone Encounter (Signed)
-----   Message from Billey Co, MD sent at 09/11/2020  7:19 AM EDT ----- Regarding: CT results His CT showed multiple bladder stones that are the likely cause of his UTI, please schedule in person follow up in the next few weeks to discuss options, thanks  Nickolas Madrid, MD 09/11/2020

## 2020-09-12 NOTE — Telephone Encounter (Signed)
Patient's wife called the office and left a voice mail message that she missed the call.  Please call back to advise of results.

## 2020-09-13 NOTE — Telephone Encounter (Signed)
Patient called back and appt made.

## 2020-09-19 ENCOUNTER — Encounter: Payer: Self-pay | Admitting: Urology

## 2020-09-19 ENCOUNTER — Other Ambulatory Visit: Payer: Self-pay

## 2020-09-19 ENCOUNTER — Ambulatory Visit: Payer: PPO | Admitting: Urology

## 2020-09-19 VITALS — BP 99/63 | HR 69 | Ht 65.0 in | Wt 227.0 lb

## 2020-09-19 DIAGNOSIS — N138 Other obstructive and reflux uropathy: Secondary | ICD-10-CM | POA: Diagnosis not present

## 2020-09-19 DIAGNOSIS — N210 Calculus in bladder: Secondary | ICD-10-CM | POA: Diagnosis not present

## 2020-09-19 DIAGNOSIS — N401 Enlarged prostate with lower urinary tract symptoms: Secondary | ICD-10-CM | POA: Diagnosis not present

## 2020-09-19 LAB — BLADDER SCAN AMB NON-IMAGING

## 2020-09-19 NOTE — Progress Notes (Signed)
   09/19/2020 1:32 PM   Ryan Ali 11/12/1940 [id number]  Reason for visit: Follow up UTI, bladder stones  HPI: 80 year old male with E. coli UTI in May 2022, as well as BPH symptoms of weak stream, straining, and nocturia 3-4 times per night despite Flomax and alfuzosin in the past.  IPSS score today is 23, with quality of life terrible.  PVR is normal at 0 mL.  He recently had a CT performed for evaluation of a AAA which showed 2 bladder stones about 1 cm each, and prostate measured 20 g.  There is also a nonobstructive right lower pole stone but no hydronephrosis.  I personally viewed and interpreted the imaging.  We discussed the risks and benefits of cystolitholapaxy and HoLEP at length.  The procedure requires general anesthesia and takes 1 to 2 hours, and a holmium laser is used fragment the bladder stones and remove them.  The laser was then used to enucleate the prostate and push this tissue into the bladder.  A morcellator is then used to remove this tissue, which is sent for pathology.  The vast majority(>95%) of patients are able to discharge the same day with a catheter in place for 2 to 3 days, and will follow-up in clinic for a voiding trial.  We specifically discussed the risks of bleeding, infection, retrograde ejaculation, temporary urgency and urge incontinence, very low risk of long-term incontinence, urethral stricture/bladder neck contracture, pathologic evaluation of prostate tissue and possible detection of prostate cancer or other malignancy, and possible need for additional procedures.  Follow-up mid July to discuss timing of cystolitholapaxy and HOLEP after meeting with vascular surgery regarding repair of abdominal aortic aneurysm.  Anticipate AAA repair more urgent than cystolitholapaxy and HOLEP, and would plan surgery after recovered from AAA repair   Billey Co, MD  [facility] [address], [place], [place]  [address] [phone]

## 2020-09-19 NOTE — Patient Instructions (Signed)

## 2020-09-28 ENCOUNTER — Ambulatory Visit (INDEPENDENT_AMBULATORY_CARE_PROVIDER_SITE_OTHER): Payer: PPO | Admitting: Vascular Surgery

## 2020-09-28 ENCOUNTER — Other Ambulatory Visit: Payer: Self-pay

## 2020-09-28 VITALS — BP 95/61 | HR 65 | Resp 16 | Wt 223.8 lb

## 2020-09-28 DIAGNOSIS — I714 Abdominal aortic aneurysm, without rupture, unspecified: Secondary | ICD-10-CM

## 2020-09-28 DIAGNOSIS — I70213 Atherosclerosis of native arteries of extremities with intermittent claudication, bilateral legs: Secondary | ICD-10-CM | POA: Diagnosis not present

## 2020-09-28 DIAGNOSIS — I10 Essential (primary) hypertension: Secondary | ICD-10-CM

## 2020-09-28 DIAGNOSIS — E1159 Type 2 diabetes mellitus with other circulatory complications: Secondary | ICD-10-CM

## 2020-09-28 DIAGNOSIS — J449 Chronic obstructive pulmonary disease, unspecified: Secondary | ICD-10-CM

## 2020-09-30 ENCOUNTER — Encounter (INDEPENDENT_AMBULATORY_CARE_PROVIDER_SITE_OTHER): Payer: Self-pay | Admitting: Vascular Surgery
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
## 2020-10-03 ENCOUNTER — Ambulatory Visit: Payer: PPO | Admitting: Urology

## 2020-10-03 ENCOUNTER — Encounter: Payer: Self-pay | Admitting: Urology

## 2020-10-03 ENCOUNTER — Other Ambulatory Visit: Payer: Self-pay

## 2020-10-03 VITALS — BP 117/75 | HR 70 | Ht 67.0 in | Wt 224.0 lb

## 2020-10-03 DIAGNOSIS — N401 Enlarged prostate with lower urinary tract symptoms: Secondary | ICD-10-CM | POA: Diagnosis not present

## 2020-10-03 DIAGNOSIS — N390 Urinary tract infection, site not specified: Secondary | ICD-10-CM | POA: Diagnosis not present

## 2020-10-03 DIAGNOSIS — N210 Calculus in bladder: Secondary | ICD-10-CM

## 2020-10-03 DIAGNOSIS — N138 Other obstructive and reflux uropathy: Secondary | ICD-10-CM | POA: Diagnosis not present

## 2020-10-03 NOTE — Patient Instructions (Signed)

## 2020-10-03 NOTE — Progress Notes (Signed)
Cystoscopy Procedure Note:  Indication: BPH, rule out urethral stricture  After informed consent and discussion of the procedure and its risks, Ryan Ali was positioned and prepped in the standard fashion. Cystoscopy was performed with a flexible cystoscope. The urethra, bladder neck and entire bladder was visualized in a standard fashion.  No urethral stricture.  The prostate was small with obstructing lateral lobes.  Vision in the bladder limited by cloudy urine and patient discomfort.  Imaging: CT shows 20 g prostate, 2 bladder stones ~1cm each  Findings: No urethral stricture  Assessment and Plan: Proceed with AAA repair, will schedule HOLEP and cystolitholapaxy to follow  Nickolas Madrid, MD 10/03/2020

## 2020-10-17 DIAGNOSIS — R0602 Shortness of breath: Secondary | ICD-10-CM | POA: Diagnosis not present

## 2020-10-17 DIAGNOSIS — R9431 Abnormal electrocardiogram [ECG] [EKG]: Secondary | ICD-10-CM | POA: Diagnosis not present

## 2020-10-17 DIAGNOSIS — Z7689 Persons encountering health services in other specified circumstances: Secondary | ICD-10-CM | POA: Diagnosis not present

## 2020-10-17 DIAGNOSIS — I10 Essential (primary) hypertension: Secondary | ICD-10-CM | POA: Diagnosis not present

## 2020-11-01 DIAGNOSIS — R9431 Abnormal electrocardiogram [ECG] [EKG]: Secondary | ICD-10-CM | POA: Diagnosis not present

## 2020-11-01 DIAGNOSIS — R0602 Shortness of breath: Secondary | ICD-10-CM | POA: Diagnosis not present

## 2020-11-08 DIAGNOSIS — I10 Essential (primary) hypertension: Secondary | ICD-10-CM | POA: Diagnosis not present

## 2020-11-08 DIAGNOSIS — R0602 Shortness of breath: Secondary | ICD-10-CM | POA: Diagnosis not present

## 2020-11-08 DIAGNOSIS — I70219 Atherosclerosis of native arteries of extremities with intermittent claudication, unspecified extremity: Secondary | ICD-10-CM | POA: Diagnosis not present

## 2020-11-10 ENCOUNTER — Encounter (INDEPENDENT_AMBULATORY_CARE_PROVIDER_SITE_OTHER): Payer: Self-pay

## 2020-11-13 ENCOUNTER — Other Ambulatory Visit (INDEPENDENT_AMBULATORY_CARE_PROVIDER_SITE_OTHER): Payer: Self-pay | Admitting: Nurse Practitioner

## 2020-11-13 ENCOUNTER — Other Ambulatory Visit: Payer: Self-pay

## 2020-11-13 ENCOUNTER — Encounter (INDEPENDENT_AMBULATORY_CARE_PROVIDER_SITE_OTHER): Payer: Self-pay

## 2020-11-13 ENCOUNTER — Telehealth (INDEPENDENT_AMBULATORY_CARE_PROVIDER_SITE_OTHER): Payer: Self-pay

## 2020-11-13 ENCOUNTER — Encounter
Admission: RE | Admit: 2020-11-13 | Discharge: 2020-11-13 | Disposition: A | Discharge status: Home / Self Care | Payer: PPO | Source: Ambulatory Visit | Attending: Vascular Surgery | Admitting: Vascular Surgery

## 2020-11-13 HISTORY — DX: Essential (primary) hypertension: I10

## 2020-11-13 NOTE — Telephone Encounter (Signed)
Patient schedule for AAA repair with Dr Delana Meyer and Dr Lucky Cowboy [facility]. Covid-19 testing will be 8/23 8-12pm at [facility] and pre-op will be on the phone today 11/13/20. Patient spouse was made aware with pre-procedure instructions,

## 2020-11-13 NOTE — Patient Instructions (Signed)
Your procedure is scheduled on: 11/15/20 Report to  [facility] For arrivl questions you may call [facility] Procedures/Recovery Dept. At [phone]  Remember: Instructions that are not followed completely may result in serious medical risk, up to and including death, or upon the discretion of your surgeon and anesthesiologist your surgery may need to be rescheduled.     _X__ 1. Do not eat food or drink liquids after midnight the night before your procedure.                 No gum chewing or hard candies.   __X__2.  On the morning of surgery brush your teeth with toothpaste and water, you                 may rinse your mouth with mouthwash if you wish.  Do not swallow any              toothpaste of mouthwash.     _X__ 3.  No Alcohol for 24 hours before or after surgery.   _X__ 4.  Do Not Smoke or use e-cigarettes For 24 Hours Prior to Your Surgery.                 Do not use any chewable tobacco products for at least 6 hours prior to                 surgery.  ____  5.  Bring all medications with you on the day of surgery if instructed.   __X__  6.  Notify your doctor if there is any change in your medical condition      (cold, fever, infections).     Do not wear jewelry, make-up, hairpins, clips or nail polish. Do not wear lotions, powders, or perfumes. No deodorant Do not shave body hair 48 hours prior to surgery. Men may shave face and neck. Do not bring valuables to the hospital.    [facility] is not responsible for any belongings or valuables.  Contacts, dentures/partials or body piercings may not be worn into surgery. Bring a case for your contacts, glasses or hearing aids, a denture cup will be supplied. Leave your suitcase in the car. After surgery it may be brought to your room. For patients admitted to the hospital, discharge time is determined by your treatment team.   Patients discharged the day of surgery will not be allowed  to drive home.   Please read over the following fact sheets that you were given:   CHG soap  __X__ Take these medicines the morning of surgery with A SIP OF WATER:    1. atenolol (TENORMIN) 25 MG tablet  2. baclofen (LIORESAL) 10 MG tablet  3. levothyroxine (SYNTHROID) 150 MCG tablet  4. omeprazole (PRILOSEC) 20 MG capsule  5. HYDROcodone-acetaminophen (NORCO/VICODIN) 5-325 MG tablet if needed  6. May use eye drop, brimonidine (ALPHAGAN) 0.2 % ophthalmic solution  ____ Fleet Enema (as directed)   __X__ Use CHG Soap/SAGE wipes as directed  __X__ Use inhalers on the day of surgery  ____ Stop metformin/Janumet/Farxiga 2 days prior to surgery    ____ Take 1/2 of usual insulin dose the night before surgery. No insulin the morning          of surgery.   ____ Stop Blood Thinners Coumadin/Plavix/Xarelto/Pleta/Pradaxa/Eliquis/Effient/Aspirin  on   Or contact your Surgeon, Cardiologist or Medical Doctor regarding  ability to stop your blood thinners  __X__ Stop Anti-inflammatories 7  days before surgery such as Advil, Ibuprofen, Motrin,  BC or Goodies Powder, Naprosyn, Naproxen, Aleve  TYLENOL ONLY IF NEEDED  __X__ Stop all herbal supplements, fish oil or vitamin E until after surgery.    ____ Bring C-Pap to the hospital.

## 2020-11-14 ENCOUNTER — Encounter: Payer: Self-pay | Admitting: Vascular Surgery

## 2020-11-14 ENCOUNTER — Other Ambulatory Visit: Payer: PPO

## 2020-11-14 ENCOUNTER — Other Ambulatory Visit
Admission: RE | Admit: 2020-11-14 | Discharge: 2020-11-14 | Disposition: A | Discharge status: Home / Self Care | Payer: PPO | Source: Ambulatory Visit | Attending: Vascular Surgery | Admitting: Vascular Surgery

## 2020-11-14 ENCOUNTER — Encounter: Payer: Self-pay | Admitting: Urgent Care

## 2020-11-14 DIAGNOSIS — Z87891 Personal history of nicotine dependence: Secondary | ICD-10-CM | POA: Diagnosis not present

## 2020-11-14 DIAGNOSIS — H409 Unspecified glaucoma: Secondary | ICD-10-CM | POA: Diagnosis present

## 2020-11-14 DIAGNOSIS — Z9481 Bone marrow transplant status: Secondary | ICD-10-CM | POA: Diagnosis not present

## 2020-11-14 DIAGNOSIS — E039 Hypothyroidism, unspecified: Secondary | ICD-10-CM | POA: Diagnosis not present

## 2020-11-14 DIAGNOSIS — Z833 Family history of diabetes mellitus: Secondary | ICD-10-CM | POA: Diagnosis not present

## 2020-11-14 DIAGNOSIS — I70213 Atherosclerosis of native arteries of extremities with intermittent claudication, bilateral legs: Secondary | ICD-10-CM | POA: Diagnosis present

## 2020-11-14 DIAGNOSIS — Z7951 Long term (current) use of inhaled steroids: Secondary | ICD-10-CM | POA: Diagnosis not present

## 2020-11-14 DIAGNOSIS — Z79899 Other long term (current) drug therapy: Secondary | ICD-10-CM | POA: Diagnosis not present

## 2020-11-14 DIAGNOSIS — J449 Chronic obstructive pulmonary disease, unspecified: Secondary | ICD-10-CM | POA: Diagnosis present

## 2020-11-14 DIAGNOSIS — Z20822 Contact with and (suspected) exposure to covid-19: Secondary | ICD-10-CM | POA: Diagnosis present

## 2020-11-14 DIAGNOSIS — R609 Edema, unspecified: Secondary | ICD-10-CM | POA: Diagnosis present

## 2020-11-14 DIAGNOSIS — Z8249 Family history of ischemic heart disease and other diseases of the circulatory system: Secondary | ICD-10-CM | POA: Diagnosis not present

## 2020-11-14 DIAGNOSIS — Z96611 Presence of right artificial shoulder joint: Secondary | ICD-10-CM | POA: Diagnosis present

## 2020-11-14 DIAGNOSIS — I10 Essential (primary) hypertension: Secondary | ICD-10-CM | POA: Diagnosis present

## 2020-11-14 DIAGNOSIS — Z86711 Personal history of pulmonary embolism: Secondary | ICD-10-CM | POA: Diagnosis not present

## 2020-11-14 DIAGNOSIS — Z7982 Long term (current) use of aspirin: Secondary | ICD-10-CM | POA: Diagnosis not present

## 2020-11-14 DIAGNOSIS — Z01812 Encounter for preprocedural laboratory examination: Secondary | ICD-10-CM | POA: Insufficient documentation

## 2020-11-14 DIAGNOSIS — E890 Postprocedural hypothyroidism: Secondary | ICD-10-CM | POA: Diagnosis present

## 2020-11-14 DIAGNOSIS — E1151 Type 2 diabetes mellitus with diabetic peripheral angiopathy without gangrene: Secondary | ICD-10-CM | POA: Diagnosis present

## 2020-11-14 DIAGNOSIS — I714 Abdominal aortic aneurysm, without rupture: Secondary | ICD-10-CM | POA: Diagnosis present

## 2020-11-14 DIAGNOSIS — Z79890 Hormone replacement therapy (postmenopausal): Secondary | ICD-10-CM | POA: Diagnosis not present

## 2020-11-14 DIAGNOSIS — C9000 Multiple myeloma not having achieved remission: Secondary | ICD-10-CM | POA: Diagnosis present

## 2020-11-14 DIAGNOSIS — I4510 Unspecified right bundle-branch block: Secondary | ICD-10-CM | POA: Diagnosis present

## 2020-11-14 DIAGNOSIS — K219 Gastro-esophageal reflux disease without esophagitis: Secondary | ICD-10-CM | POA: Diagnosis present

## 2020-11-14 LAB — CBC WITH DIFFERENTIAL/PLATELET
Abs Immature Granulocytes: 0.03 10*3/uL (ref 0.00–0.07)
Basophils Absolute: 0 10*3/uL (ref 0.0–0.1)
Basophils Relative: 0 %
Eosinophils Absolute: 0.1 10*3/uL (ref 0.0–0.5)
Eosinophils Relative: 1 %
HCT: 42.6 % (ref 39.0–52.0)
Hemoglobin: 14 g/dL (ref 13.0–17.0)
Immature Granulocytes: 0 %
Lymphocytes Relative: 25 %
Lymphs Abs: 1.7 10*3/uL (ref 0.7–4.0)
MCH: 31.9 pg (ref 26.0–34.0)
MCHC: 32.9 g/dL (ref 30.0–36.0)
MCV: 97 fL (ref 80.0–100.0)
Monocytes Absolute: 0.5 10*3/uL (ref 0.1–1.0)
Monocytes Relative: 7 %
Neutro Abs: 4.4 10*3/uL (ref 1.7–7.7)
Neutrophils Relative %: 67 %
Platelets: 174 10*3/uL (ref 150–400)
RBC: 4.39 MIL/uL (ref 4.22–5.81)
RDW: 13.3 % (ref 11.5–15.5)
WBC: 6.7 10*3/uL (ref 4.0–10.5)
nRBC: 0 % (ref 0.0–0.2)

## 2020-11-14 LAB — TYPE AND SCREEN
ABO/RH(D): O POS
Antibody Screen: NEGATIVE

## 2020-11-14 LAB — BASIC METABOLIC PANEL
Anion gap: 12 (ref 5–15)
BUN: 24 mg/dL — ABNORMAL HIGH (ref 8–23)
CO2: 26 mmol/L (ref 22–32)
Calcium: 8.8 mg/dL — ABNORMAL LOW (ref 8.9–10.3)
Chloride: 101 mmol/L (ref 98–111)
Creatinine, Ser: 1.32 mg/dL — ABNORMAL HIGH (ref 0.61–1.24)
GFR, Estimated: 54 mL/min — ABNORMAL LOW (ref >60)
Glucose, Bld: 114 mg/dL — ABNORMAL HIGH (ref 70–99)
Potassium: 4.4 mmol/L (ref 3.5–5.1)
Sodium: 139 mmol/L (ref 135–145)
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
## 2020-11-15 ENCOUNTER — Inpatient Hospital Stay: Payer: PPO | Admitting: Urgent Care

## 2020-11-15 ENCOUNTER — Encounter
Admission: RE | Disposition: A | Discharge status: Home / Self Care | Payer: Self-pay | Source: Home / Self Care | Attending: Vascular Surgery

## 2020-11-15 ENCOUNTER — Encounter: Payer: Self-pay | Admitting: Vascular Surgery

## 2020-11-15 ENCOUNTER — Inpatient Hospital Stay
Admission: RE | Admit: 2020-11-15 | Discharge: 2020-11-16 | DRG: 269 | Disposition: A | Discharge status: Home / Self Care | Payer: PPO | Attending: Vascular Surgery | Admitting: Vascular Surgery

## 2020-11-15 ENCOUNTER — Other Ambulatory Visit: Payer: Self-pay

## 2020-11-15 DIAGNOSIS — H409 Unspecified glaucoma: Secondary | ICD-10-CM | POA: Diagnosis present

## 2020-11-15 DIAGNOSIS — E1151 Type 2 diabetes mellitus with diabetic peripheral angiopathy without gangrene: Secondary | ICD-10-CM | POA: Diagnosis present

## 2020-11-15 DIAGNOSIS — R609 Edema, unspecified: Secondary | ICD-10-CM | POA: Diagnosis present

## 2020-11-15 DIAGNOSIS — Z7982 Long term (current) use of aspirin: Secondary | ICD-10-CM

## 2020-11-15 DIAGNOSIS — C9000 Multiple myeloma not having achieved remission: Secondary | ICD-10-CM | POA: Diagnosis present

## 2020-11-15 DIAGNOSIS — Z79890 Hormone replacement therapy (postmenopausal): Secondary | ICD-10-CM

## 2020-11-15 DIAGNOSIS — Z833 Family history of diabetes mellitus: Secondary | ICD-10-CM

## 2020-11-15 DIAGNOSIS — Z8249 Family history of ischemic heart disease and other diseases of the circulatory system: Secondary | ICD-10-CM

## 2020-11-15 DIAGNOSIS — K219 Gastro-esophageal reflux disease without esophagitis: Secondary | ICD-10-CM | POA: Diagnosis present

## 2020-11-15 DIAGNOSIS — Z9481 Bone marrow transplant status: Secondary | ICD-10-CM | POA: Diagnosis not present

## 2020-11-15 DIAGNOSIS — I4510 Unspecified right bundle-branch block: Secondary | ICD-10-CM | POA: Diagnosis present

## 2020-11-15 DIAGNOSIS — I10 Essential (primary) hypertension: Secondary | ICD-10-CM | POA: Diagnosis present

## 2020-11-15 DIAGNOSIS — Z79899 Other long term (current) drug therapy: Secondary | ICD-10-CM | POA: Diagnosis not present

## 2020-11-15 DIAGNOSIS — E890 Postprocedural hypothyroidism: Secondary | ICD-10-CM | POA: Diagnosis present

## 2020-11-15 DIAGNOSIS — Z86711 Personal history of pulmonary embolism: Secondary | ICD-10-CM

## 2020-11-15 DIAGNOSIS — Z87891 Personal history of nicotine dependence: Secondary | ICD-10-CM | POA: Diagnosis not present

## 2020-11-15 DIAGNOSIS — I70213 Atherosclerosis of native arteries of extremities with intermittent claudication, bilateral legs: Secondary | ICD-10-CM | POA: Diagnosis present

## 2020-11-15 DIAGNOSIS — Z20822 Contact with and (suspected) exposure to covid-19: Secondary | ICD-10-CM | POA: Diagnosis present

## 2020-11-15 DIAGNOSIS — Z96611 Presence of right artificial shoulder joint: Secondary | ICD-10-CM | POA: Diagnosis present

## 2020-11-15 DIAGNOSIS — Z7951 Long term (current) use of inhaled steroids: Secondary | ICD-10-CM | POA: Diagnosis not present

## 2020-11-15 DIAGNOSIS — I714 Abdominal aortic aneurysm, without rupture, unspecified: Secondary | ICD-10-CM | POA: Diagnosis present

## 2020-11-15 DIAGNOSIS — J449 Chronic obstructive pulmonary disease, unspecified: Secondary | ICD-10-CM | POA: Diagnosis present

## 2020-11-15 HISTORY — PX: ENDOVASCULAR REPAIR/STENT GRAFT: CATH118280

## 2020-11-15 HISTORY — DX: Unspecified glaucoma: H40.9

## 2020-11-15 HISTORY — DX: Unspecified right bundle-branch block: I45.10

## 2020-11-15 HISTORY — DX: Atherosclerosis of aorta: I70.0

## 2020-11-15 LAB — MRSA NEXT GEN BY PCR, NASAL: MRSA by PCR Next Gen: NOT DETECTED

## 2020-11-15 LAB — GLUCOSE, CAPILLARY: Glucose-Capillary: 146 mg/dL — ABNORMAL HIGH (ref 70–99)

## 2020-11-15 LAB — SARS CORONAVIRUS 2 (TAT 6-24 HRS): SARS Coronavirus 2: NEGATIVE

## 2020-11-15 SURGERY — ENDOVASCULAR REPAIR/STENT GRAFT
Anesthesia: General

## 2020-11-15 MED ORDER — FENTANYL CITRATE (PF) 100 MCG/2ML IJ SOLN
INTRAMUSCULAR | Status: AC
  Filled 2020-11-15: qty 2

## 2020-11-15 MED ORDER — PHENOL 1.4 % MT LIQD
1.0000 | OROMUCOSAL | Status: DC | PRN
  Filled 2020-11-15: qty 177

## 2020-11-15 MED ORDER — CLOPIDOGREL BISULFATE 75 MG PO TABS
75.0000 mg | ORAL_TABLET | Freq: Every day | ORAL | Status: DC
  Administered 2020-11-16: 75 mg via ORAL
  Filled 2020-11-15: qty 1

## 2020-11-15 MED ORDER — CHLORHEXIDINE GLUCONATE CLOTH 2 % EX PADS: 6.0000 | MEDICATED_PAD | Freq: Once | CUTANEOUS | Status: DC

## 2020-11-15 MED ORDER — ASPIRIN EC 81 MG PO TBEC
81.0000 mg | DELAYED_RELEASE_TABLET | Freq: Every day | ORAL | Status: DC
  Administered 2020-11-16: 81 mg via ORAL
  Filled 2020-11-15: qty 1

## 2020-11-15 MED ORDER — MAGNESIUM OXIDE -MG SUPPLEMENT 400 (240 MG) MG PO TABS
400.0000 mg | ORAL_TABLET | ORAL | Status: DC
  Administered 2020-11-16: 400 mg via ORAL
  Filled 2020-11-15: qty 1

## 2020-11-15 MED ORDER — LEVOTHYROXINE SODIUM 150 MCG PO TABS
150.0000 ug | ORAL_TABLET | Freq: Every day | ORAL | Status: DC
  Administered 2020-11-16: 150 ug via ORAL
  Filled 2020-11-15: qty 1
  Filled 2020-11-15: qty 3

## 2020-11-15 MED ORDER — TAMSULOSIN HCL 0.4 MG PO CAPS
0.4000 mg | ORAL_CAPSULE | Freq: Every day | ORAL | Status: DC
  Administered 2020-11-16: 0.4 mg via ORAL
  Filled 2020-11-15: qty 1

## 2020-11-15 MED ORDER — METOPROLOL TARTRATE 5 MG/5ML IV SOLN: 2.0000 mg | INTRAVENOUS | Status: DC | PRN

## 2020-11-15 MED ORDER — MOMETASONE FURO-FORMOTEROL FUM 200-5 MCG/ACT IN AERO: 2.0000 | INHALATION_SPRAY | Freq: Two times a day (BID) | RESPIRATORY_TRACT | Status: DC

## 2020-11-15 MED ORDER — SODIUM CHLORIDE 0.9 % IV SOLN: INTRAVENOUS | Status: DC

## 2020-11-15 MED ORDER — ASTAXANTHIN 4 MG PO CAPS: 12.0000 mg | ORAL_CAPSULE | Freq: Every day | ORAL | Status: DC

## 2020-11-15 MED ORDER — MOMETASONE FURO-FORMOTEROL FUM 200-5 MCG/ACT IN AERO
2.0000 | INHALATION_SPRAY | Freq: Two times a day (BID) | RESPIRATORY_TRACT | Status: DC
  Administered 2020-11-15: 2 via RESPIRATORY_TRACT
  Filled 2020-11-15: qty 8.8

## 2020-11-15 MED ORDER — ROCURONIUM BROMIDE 100 MG/10ML IV SOLN
INTRAVENOUS | Status: DC | PRN
  Administered 2020-11-15: 20 mg via INTRAVENOUS
  Administered 2020-11-15: 50 mg via INTRAVENOUS

## 2020-11-15 MED ORDER — MORPHINE SULFATE (PF) 2 MG/ML IV SOLN: 2.0000 mg | INTRAVENOUS | Status: DC | PRN

## 2020-11-15 MED ORDER — HEPARIN SODIUM (PORCINE) 1000 UNIT/ML IJ SOLN
INTRAMUSCULAR | Status: DC | PRN
  Administered 2020-11-15: 5000 [IU] via INTRAVENOUS

## 2020-11-15 MED ORDER — SODIUM CHLORIDE 0.9 % IV SOLN: INTRAVENOUS | Status: DC | PRN

## 2020-11-15 MED ORDER — ONDANSETRON HCL 4 MG/2ML IJ SOLN: 4.0000 mg | Freq: Four times a day (QID) | INTRAMUSCULAR | Status: DC | PRN

## 2020-11-15 MED ORDER — ASCORBIC ACID 500 MG PO TABS
500.0000 mg | ORAL_TABLET | Freq: Two times a day (BID) | ORAL | Status: DC
  Administered 2020-11-15 – 2020-11-16 (×2): 500 mg via ORAL
  Filled 2020-11-15 (×2): qty 1

## 2020-11-15 MED ORDER — SUGAMMADEX SODIUM 200 MG/2ML IV SOLN
INTRAVENOUS | Status: DC | PRN
  Administered 2020-11-15: 200 mg via INTRAVENOUS

## 2020-11-15 MED ORDER — LIDOCAINE HCL (CARDIAC) PF 100 MG/5ML IV SOSY
PREFILLED_SYRINGE | INTRAVENOUS | Status: DC | PRN
  Administered 2020-11-15: 50 mg via INTRAVENOUS

## 2020-11-15 MED ORDER — CALCIUM CARB-CHOLECALCIFEROL 600-800 MG-UNIT PO CHEW: 1.0000 | CHEWABLE_TABLET | Freq: Every day | ORAL | Status: DC

## 2020-11-15 MED ORDER — CALCIUM 600+D PLUS MINERALS 600-400 MG-UNIT PO CHEW: 3.0000 | CHEWABLE_TABLET | Freq: Every day | ORAL | Status: DC

## 2020-11-15 MED ORDER — ACETAMINOPHEN 650 MG RE SUPP: 325.0000 mg | RECTAL | Status: DC | PRN

## 2020-11-15 MED ORDER — CEFAZOLIN SODIUM-DEXTROSE 2-4 GM/100ML-% IV SOLN
INTRAVENOUS | Status: AC
  Administered 2020-11-15: 2 g via INTRAVENOUS
  Filled 2020-11-15: qty 100

## 2020-11-15 MED ORDER — BRIMONIDINE TARTRATE 0.2 % OP SOLN
1.0000 [drp] | Freq: Every day | OPHTHALMIC | Status: DC
  Administered 2020-11-15: 1 [drp] via OPHTHALMIC
  Filled 2020-11-15: qty 5

## 2020-11-15 MED ORDER — CALCIUM CARBONATE 1250 (500 CA) MG PO TABS
2.0000 | ORAL_TABLET | Freq: Two times a day (BID) | ORAL | Status: DC
  Administered 2020-11-16: 1000 mg via ORAL
  Filled 2020-11-15 (×2): qty 2

## 2020-11-15 MED ORDER — VITAMIN C 250 MG PO TABS: 1400.0000 mg | ORAL_TABLET | Freq: Every day | ORAL | Status: DC

## 2020-11-15 MED ORDER — CEFAZOLIN SODIUM-DEXTROSE 2-4 GM/100ML-% IV SOLN
2.0000 g | Freq: Three times a day (TID) | INTRAVENOUS | Status: AC
Start: 2020-11-15 — End: 2020-11-16
  Administered 2020-11-16: 2 g via INTRAVENOUS
  Filled 2020-11-15 (×2): qty 100

## 2020-11-15 MED ORDER — PANTOPRAZOLE SODIUM 40 MG PO TBEC
40.0000 mg | DELAYED_RELEASE_TABLET | Freq: Every day | ORAL | Status: DC
  Administered 2020-11-16: 40 mg via ORAL
  Filled 2020-11-15: qty 1

## 2020-11-15 MED ORDER — FENTANYL CITRATE PF 50 MCG/ML IJ SOSY: 25.0000 ug | PREFILLED_SYRINGE | INTRAMUSCULAR | Status: DC | PRN

## 2020-11-15 MED ORDER — CHLORHEXIDINE GLUCONATE CLOTH 2 % EX PADS
6.0000 | MEDICATED_PAD | Freq: Once | CUTANEOUS | Status: AC
  Administered 2020-11-15: 6 via TOPICAL

## 2020-11-15 MED ORDER — FAMOTIDINE IN NACL 20-0.9 MG/50ML-% IV SOLN: 20.0000 mg | Freq: Every day | INTRAVENOUS | Status: DC

## 2020-11-15 MED ORDER — HYDROMORPHONE HCL 1 MG/ML IJ SOLN: 1.0000 mg | Freq: Once | INTRAMUSCULAR | Status: DC | PRN

## 2020-11-15 MED ORDER — HYDROCODONE-ACETAMINOPHEN 5-325 MG PO TABS
1.0000 | ORAL_TABLET | Freq: Four times a day (QID) | ORAL | Status: DC | PRN
Start: 2020-11-15 — End: 2020-11-16
  Filled 2020-11-15: qty 1

## 2020-11-15 MED ORDER — BACLOFEN 10 MG PO TABS
10.0000 mg | ORAL_TABLET | Freq: Two times a day (BID) | ORAL | Status: DC
  Administered 2020-11-15 – 2020-11-16 (×2): 10 mg via ORAL
  Filled 2020-11-15 (×3): qty 1

## 2020-11-15 MED ORDER — FAMOTIDINE 20 MG PO TABS
20.0000 mg | ORAL_TABLET | Freq: Every day | ORAL | Status: DC
  Administered 2020-11-15: 20 mg via ORAL
  Filled 2020-11-15: qty 1

## 2020-11-15 MED ORDER — OXYCODONE-ACETAMINOPHEN 5-325 MG PO TABS: 1.0000 | ORAL_TABLET | ORAL | Status: DC | PRN

## 2020-11-15 MED ORDER — LABETALOL HCL 5 MG/ML IV SOLN
10.0000 mg | INTRAVENOUS | Status: DC | PRN
Start: 2020-11-15 — End: 2020-11-16

## 2020-11-15 MED ORDER — DOCUSATE SODIUM 100 MG PO CAPS
100.0000 mg | ORAL_CAPSULE | Freq: Every day | ORAL | Status: DC
  Filled 2020-11-15: qty 1

## 2020-11-15 MED ORDER — CHLORHEXIDINE GLUCONATE 0.12 % MT SOLN
15.0000 mL | Freq: Once | OROMUCOSAL | Status: DC
  Filled 2020-11-15: qty 15

## 2020-11-15 MED ORDER — ATENOLOL 25 MG PO TABS
25.0000 mg | ORAL_TABLET | ORAL | Status: DC
  Administered 2020-11-16: 25 mg via ORAL
  Filled 2020-11-15 (×2): qty 1

## 2020-11-15 MED ORDER — TIOTROPIUM BROMIDE MONOHYDRATE 18 MCG IN CAPS
18.0000 ug | ORAL_CAPSULE | Freq: Every day | RESPIRATORY_TRACT | Status: DC
  Administered 2020-11-15: 18 ug via RESPIRATORY_TRACT
  Filled 2020-11-15: qty 5

## 2020-11-15 MED ORDER — PHENYLEPHRINE HCL (PRESSORS) 10 MG/ML IV SOLN
INTRAVENOUS | Status: AC
  Filled 2020-11-15: qty 1

## 2020-11-15 MED ORDER — IODIXANOL 320 MG/ML IV SOLN
INTRAVENOUS | Status: DC | PRN
  Administered 2020-11-15: 60 mL

## 2020-11-15 MED ORDER — DOPAMINE-DEXTROSE 3.2-5 MG/ML-% IV SOLN: 3.0000 ug/kg/min | INTRAVENOUS | Status: DC

## 2020-11-15 MED ORDER — PROPOFOL 10 MG/ML IV BOLUS
INTRAVENOUS | Status: DC | PRN
  Administered 2020-11-15: 80 mg via INTRAVENOUS

## 2020-11-15 MED ORDER — FENTANYL CITRATE (PF) 100 MCG/2ML IJ SOLN
INTRAMUSCULAR | Status: DC | PRN
  Administered 2020-11-15: 50 ug via INTRAVENOUS

## 2020-11-15 MED ORDER — GUAIFENESIN-DM 100-10 MG/5ML PO SYRP: 15.0000 mL | ORAL_SOLUTION | ORAL | Status: DC | PRN

## 2020-11-15 MED ORDER — ONDANSETRON HCL 4 MG/2ML IJ SOLN: 4.0000 mg | Freq: Once | INTRAMUSCULAR | Status: DC | PRN

## 2020-11-15 MED ORDER — PROPOFOL 10 MG/ML IV BOLUS
INTRAVENOUS | Status: AC
  Filled 2020-11-15: qty 20

## 2020-11-15 MED ORDER — FERROUS SULFATE 325 (65 FE) MG PO TABS
325.0000 mg | ORAL_TABLET | Freq: Every day | ORAL | Status: DC
  Administered 2020-11-16: 325 mg via ORAL
  Filled 2020-11-15: qty 1

## 2020-11-15 MED ORDER — HYDRALAZINE HCL 20 MG/ML IJ SOLN: 5.0000 mg | INTRAMUSCULAR | Status: DC | PRN

## 2020-11-15 MED ORDER — CEFAZOLIN SODIUM-DEXTROSE 2-4 GM/100ML-% IV SOLN
2.0000 g | INTRAVENOUS | Status: AC
  Administered 2020-11-15: 2 g via INTRAVENOUS

## 2020-11-15 MED ORDER — NITROGLYCERIN IN D5W 200-5 MCG/ML-% IV SOLN: 5.0000 ug/min | INTRAVENOUS | Status: DC

## 2020-11-15 MED ORDER — SODIUM CHLORIDE 0.9 % IV SOLN
INTRAVENOUS | Status: DC | PRN
  Administered 2020-11-15: 30 ug/min via INTRAVENOUS

## 2020-11-15 MED ORDER — POTASSIUM CHLORIDE CRYS ER 20 MEQ PO TBCR: 20.0000 meq | EXTENDED_RELEASE_TABLET | Freq: Every day | ORAL | Status: DC | PRN

## 2020-11-15 MED ORDER — ONDANSETRON HCL 4 MG/2ML IJ SOLN
INTRAMUSCULAR | Status: DC | PRN
  Administered 2020-11-15: 4 mg via INTRAVENOUS

## 2020-11-15 MED ORDER — MAGNESIUM SULFATE 2 GM/50ML IV SOLN
2.0000 g | Freq: Every day | INTRAVENOUS | Status: DC | PRN
Start: 2020-11-15 — End: 2020-11-16
  Filled 2020-11-15: qty 50

## 2020-11-15 MED ORDER — VANCOMYCIN HCL IN DEXTROSE 1-5 GM/200ML-% IV SOLN
1000.0000 mg | Freq: Two times a day (BID) | INTRAVENOUS | Status: AC
  Administered 2020-11-15 – 2020-11-16 (×2): 1000 mg via INTRAVENOUS
  Filled 2020-11-15 (×2): qty 200

## 2020-11-15 MED ORDER — VITAMIN D 25 MCG (1000 UNIT) PO TABS
1000.0000 [IU] | ORAL_TABLET | Freq: Every day | ORAL | Status: DC
  Administered 2020-11-16: 1000 [IU] via ORAL
  Filled 2020-11-15: qty 1

## 2020-11-15 MED ORDER — ORAL CARE MOUTH RINSE: 15.0000 mL | Freq: Once | OROMUCOSAL | Status: DC

## 2020-11-15 MED ORDER — ALUM & MAG HYDROXIDE-SIMETH 200-200-20 MG/5ML PO SUSP: 15.0000 mL | ORAL | Status: DC | PRN

## 2020-11-15 MED ORDER — DEXAMETHASONE SODIUM PHOSPHATE 10 MG/ML IJ SOLN
INTRAMUSCULAR | Status: DC | PRN
  Administered 2020-11-15: 10 mg via INTRAVENOUS

## 2020-11-15 MED ORDER — ALBUTEROL SULFATE (2.5 MG/3ML) 0.083% IN NEBU: 3.0000 mL | INHALATION_SOLUTION | Freq: Four times a day (QID) | RESPIRATORY_TRACT | Status: DC | PRN

## 2020-11-15 MED ORDER — ATORVASTATIN CALCIUM 20 MG PO TABS
20.0000 mg | ORAL_TABLET | Freq: Every day | ORAL | Status: DC
  Administered 2020-11-15 – 2020-11-16 (×2): 20 mg via ORAL
  Filled 2020-11-15 (×2): qty 1

## 2020-11-15 MED ORDER — SODIUM CHLORIDE 0.9 % IV SOLN: 500.0000 mL | Freq: Once | INTRAVENOUS | Status: DC | PRN

## 2020-11-15 MED ORDER — ACETAMINOPHEN 325 MG PO TABS: 325.0000 mg | ORAL_TABLET | ORAL | Status: DC | PRN

## 2020-11-15 SURGICAL SUPPLY — 27 items
CATH ACCU-VU SIZ PIG 5F 70CM (CATHETERS) ×2 IMPLANT
CATH BALLN CODA 9X100X32 (BALLOONS) ×2 IMPLANT
CATH BEACON 5 .035 65 KMP TIP (CATHETERS) ×2 IMPLANT
CLOSURE PERCLOSE PROSTYLE (VASCULAR PRODUCTS) ×8 IMPLANT
COVER DRAPE FLUORO 36X44 (DRAPES) ×4 IMPLANT
COVER PROBE U/S 5X48 (MISCELLANEOUS) ×2 IMPLANT
DEVICE SAFEGUARD 24CM (GAUZE/BANDAGES/DRESSINGS) ×4 IMPLANT
DEVICE TORQUE .025-.038 (MISCELLANEOUS) ×2 IMPLANT
DRYSEAL FLEXSHEATH 12FR 33CM (SHEATH) ×1
DRYSEAL FLEXSHEATH 18FR 33CM (SHEATH) ×1
EXCLDR TRNK 28.5X14.5X12 16F (Endovascular Graft) ×2 IMPLANT
EXCLUDER TNK 28.5X14.5X12 16F (Endovascular Graft) ×1 IMPLANT
GLIDEWIRE STIFF .35X180X3 HYDR (WIRE) ×2 IMPLANT
KIT MICROPUNCTURE NIT STIFF (SHEATH) ×2 IMPLANT
LEG CONTRALATERAL 16X18X13.5 (Endovascular Graft) ×2 IMPLANT
LEG CONTRALATERAL 20X11.5 (Vascular Products) ×1 IMPLANT
NEEDLE ENTRY 21GA 7CM ECHOTIP (NEEDLE) ×2 IMPLANT
PACK ANGIOGRAPHY (CUSTOM PROCEDURE TRAY) ×2 IMPLANT
SHEATH BRITE TIP 6FRX11 (SHEATH) ×4 IMPLANT
SHEATH BRITE TIP 8FRX11 (SHEATH) ×4 IMPLANT
SHEATH DRYSEAL FLEX 12FR 33CM (SHEATH) ×1 IMPLANT
SHEATH DRYSEAL FLEX 18FR 33CM (SHEATH) ×1 IMPLANT
SPONGE XRAY 4X4 16PLY STRL (MISCELLANEOUS) ×6 IMPLANT
STENT GRAFT CONTRALAT 20X11.5 (Vascular Products) ×1 IMPLANT
TUBING CONTRAST HIGH PRESS 48 (TUBING) ×4 IMPLANT
WIRE AMPLATZ SSTIFF .035X260CM (WIRE) ×4 IMPLANT
WIRE GUIDERIGHT .035X150 (WIRE) ×4 IMPLANT

## 2020-11-15 NOTE — Op Note (Signed)
OPERATIVE NOTE   PROCEDURE: US guidance for vascular access, bilateral femoral arteries Catheter placement into aorta from bilateral femoral approaches Placement of a 28 mm proximal conformable Gore Excluder Endoprosthesis main body left with a 18 mm x 14 cm right contralateral limb Placement of a 20 mm diameter by 12 cm length left iliac extension limb ProGlide closure devices bilateral femoral arteries  PRE-OPERATIVE DIAGNOSIS: AAA  POST-OPERATIVE DIAGNOSIS: same  SURGEON: [name], MD and [name], MD - Co-surgeons  ANESTHESIA: general  ESTIMATED BLOOD LOSS: 50 cc  FINDING(S): 1.  AAA  SPECIMEN(S):  none  INDICATIONS:   Ryan Ali is a 80 y.o. male who presents with a >5 cm. The anatomy was suitable for endovascular repair.  Risks and benefits of repair in an endovascular fashion were discussed and informed consent was obtained. Co-surgeons are used to expedite the procedure and reduce operative time as bilateral work needs to be done.  DESCRIPTION: After obtaining full informed written consent, the patient was brought back to the operating room and placed supine upon the operating table.  The patient received IV antibiotics prior to induction.  After obtaining adequate anesthesia, the patient was prepped and draped in the standard fashion for endovascular AAA repair.  We then began by gaining access to both femoral arteries with US guidance with me working on the right and Dr. Delana Meyer working on the left.  The femoral arteries were found to be patent and accessed without difficulty with a needle under ultrasound guidance without difficulty on each side and permanent images were recorded.  We then placed 2 proglide devices on each side in a pre-close fashion and placed 8 French sheaths. The patient was then given 5000 units of intravenous heparin. The Pigtail catheter was placed into the aorta from the left side. Using this image, we selected a 28 mm conformable  C3 Main body device.  Over a stiff wire, an 46 French sheath was placed up the left. The main body was then placed through the 18 French sheath. A Kumpe catheter was placed up the right side and a magnified image at the renal arteries was performed. The main body was then deployed just below the lowest renal artery which was the left. The Kumpe catheter was used to cannulate the contralateral gate without difficulty and successful cannulation was confirmed by twirling the pigtail catheter in the main body. We then placed a stiff wire and a retrograde arteriogram was performed through the right femoral sheath. We upsized to the 12 [place] sheath for the contralateral limb and a 18 mm diameter by 14 cm length right iliac limb was selected and deployed. The main body deployment was then completed. Based off the angiographic findings, extension limbs were necessary.  A 20 mm x 12 cm left iliac extension limb was used and deployed just above the left hypogastric artery . All junction points and seals zones were treated with the compliant balloon. The pigtail catheter was then replaced and a completion angiogram was performed.  No obvious Endoleak was detected on completion angiography. The renal arteries were found to be widely patent. Both hypogastric arteries are widely patent. At this point we elected to terminate the procedure. We secured the pro glide devices for hemostasis on the femoral arteries. The skin incision was closed with a 4-0 Monocryl. Dermabond and pressure dressing were placed. The patient was taken to the recovery room in stable condition having tolerated the procedure well.  COMPLICATIONS: none  CONDITION: stable  [name]  11/15/2020, 2:47 PM   This note was created with Dragon Medical transcription system. Any errors in dictation are purely unintentional.

## 2020-11-15 NOTE — Interval H&P Note (Signed)
History and Physical Interval Note:  11/15/2020 1:40 PM  Ryan Ali  has presented today for surgery, with the diagnosis of Endovascular Repair  GORE  ANESTHESIA    AAA Dr Delana Meyer w Dr Lucky Cowboy to assist.  The various methods of treatment have been discussed with the patient and family. After consideration of risks, benefits and other options for treatment, the patient has consented to  Procedure(s): ENDOVASCULAR REPAIR/STENT GRAFT (N/A) as a surgical intervention.  The patient's history has been reviewed, patient examined, no change in status, stable for surgery.  I have reviewed the patient's chart and labs.  Questions were answered to the patient's satisfaction.     [name]
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
## 2020-11-15 NOTE — Anesthesia Procedure Notes (Signed)
Procedure Name: Intubation Date/Time: 11/15/2020 1:53 PM Performed by: Nelda Marseille, CRNA Pre-anesthesia Checklist: Patient identified, Patient being monitored, Timeout performed, Emergency Drugs available and Suction available Patient Re-evaluated:Patient Re-evaluated prior to induction Oxygen Delivery Method: Circle system utilized Preoxygenation: Pre-oxygenation with 100% oxygen Induction Type: IV induction Ventilation: Mask ventilation without difficulty Laryngoscope Size: Mac, 3 and McGraph Grade View: Grade I Tube type: Oral Tube size: 7.5 mm Number of attempts: 1 Airway Equipment and Method: Stylet Placement Confirmation: ETT inserted through vocal cords under direct vision, positive ETCO2 and breath sounds checked- equal and bilateral Secured at: 21 cm Tube secured with: Tape Dental Injury: Teeth and Oropharynx as per pre-operative assessment

## 2020-11-15 NOTE — Progress Notes (Signed)
Patient admitted to ICU from PACU, arrived via monitor, bed, and RN.  Alert and oriented upon arrival.  Patient denies pain and in no distress at time of arrival.  Oriented to room, call bell within reach, and bed alarm activated.

## 2020-11-15 NOTE — Transfer of Care (Signed)
Immediate Anesthesia Transfer of Care Note  Patient: Ryan Ali  Procedure(s) Performed: ENDOVASCULAR REPAIR/STENT GRAFT  Patient Location: PACU  Anesthesia Type:General  Level of Consciousness: awake, alert  and oriented  Airway & Oxygen Therapy: Patient Spontanous Breathing and Patient connected to face mask oxygen  Post-op Assessment: Report given to RN and Post -op Vital signs reviewed and stable  Post vital signs: Reviewed and stable  Last Vitals:  Vitals Value Taken Time  BP    Temp    Pulse    Resp    SpO2      Last Pain:  Vitals:   11/15/20 1234  TempSrc: Oral  PainSc: 0-No pain         Complications: No notable events documented.

## 2020-11-15 NOTE — Interval H&P Note (Signed)
History and Physical Interval Note:  11/15/2020 1:29 PM  Ryan Ali  has presented today for surgery, with the diagnosis of Endovascular Repair  GORE  ANESTHESIA    AAA Dr Delana Meyer w Dr Lucky Cowboy to assist.  The various methods of treatment have been discussed with the patient and family. After consideration of risks, benefits and other options for treatment, the patient has consented to  Procedure(s): ENDOVASCULAR REPAIR/STENT GRAFT (N/A) as a surgical intervention.  The patient's history has been reviewed, patient examined, no change in status, stable for surgery.  I have reviewed the patient's chart and labs.  Questions were answered to the patient's satisfaction.     [name]

## 2020-11-15 NOTE — Plan of Care (Signed)
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
## 2020-11-15 NOTE — Op Note (Signed)
OPERATIVE NOTE   PROCEDURE: US guidance for vascular access, bilateral femoral arteries Catheter placement into aorta from bilateral femoral approaches Placement of a 28 x 14 x 12 conformable Gore Excluder Endoprosthesis main body with a 20 x 12 ipsilateral limb with a 18 x 14 contralateral limb ProGlide closure devices bilateral femoral arteries  PRE-OPERATIVE DIAGNOSIS: AAA  POST-OPERATIVE DIAGNOSIS: same  SURGEON: [name], MD and [name], MD - Co-surgeons  ANESTHESIA: general  ESTIMATED BLOOD LOSS: 50 cc  FINDING(S): 1.  AAA  SPECIMEN(S):  none  INDICATIONS:   Ryan Ali is a 80 y.o. y.o. male who presents with a 5 cm abdominal aortic aneurysm which is suitable for endovascular repair.  He is undergoing stent graft placement to prevent lethal rupture.  Risks and benefits of been reviewed all questions have been answered patient agrees to proceed.  DESCRIPTION: After obtaining full informed written consent, the patient was brought back to the operating room and placed supine upon the operating table.  The patient received IV antibiotics prior to induction.  After obtaining adequate anesthesia, the patient was prepped and draped in the standard fashion for endovascular AAA repair.  Co-surgeons are required because this is a complex bilateral procedure with work being performed simultaneously from both the right femoral and left femoral approach.  This also expedites the procedure making a shorter operative time reducing complications and improving patient safety.  We then began by gaining access to both femoral arteries with US guidance with me working on the patient's left and Dr. Lucky Cowboy working on the right.  The femoral arteries were found to be patent and accessed without difficulty with a needle under ultrasound guidance without difficulty on each side and permanent images were recorded.  We then placed 2 proglide devices on each side in a pre-close fashion  and placed 8 French sheaths.  The patient was then given 5000 units of intravenous heparin.   The Pigtail catheter was placed into the aorta from the left side. Using this image, we selected a 28 x 14 x 12 conformable Main body device.  Over a stiff wire, an 41 French sheath was placed. The main body was then placed through the 18 French sheath. A Kumpe catheter was placed up the right side and a magnified image at the renal arteries was performed. The main body was then deployed just below the lowest renal artery. The Kumpe catheter was used to cannulate the contralateral gate without difficulty and successful cannulation was confirmed by twirling the pigtail catheter in the main body.  We then advanced the pigtail catheter above the stent and repeated our aortic imaging with the renal arteries.  The stent graft was read constrained and repositioned slightly.  Follow-up imaging demonstrated we were approximately 1 mm below the lower renal artery which was the right.  We then placed a stiff wire through the pigtail catheter and a retrograde arteriogram was performed through the right femoral sheath. We upsized to the 12 [place] sheath for the contralateral limb and a 18 x 14 limb was selected and deployed. The main body deployment was then completed.  Next the pigtail catheter was replaced up the left side hand-injection contrast was utilized and an RAO projection to demonstrate the iliac bifurcation.  Based off the angiographic findings, extension limb was necessary.  A 20 x 12 ipsilateral limb was then advanced and deployed completing the left side. All junction points and seals zones were treated with the compliant balloon.  The pigtail catheter was then replaced and a completion angiogram was performed.   No endoleak was detected on completion angiography. The renal arteries were found to be widely patent.    At this point we elected to terminate the procedure. We secured the pro glide devices for  hemostasis on the femoral arteries. The skin incision was closed with a 4-0 Monocryl. Dermabond and pressure dressing were placed. The patient was taken to the recovery room in stable condition having tolerated the procedure well.  COMPLICATIONS: none  CONDITION: stable  [name]  11/15/2020, 3:10 PM

## 2020-11-16 ENCOUNTER — Encounter: Payer: Self-pay | Admitting: Vascular Surgery

## 2020-11-16 DIAGNOSIS — I714 Abdominal aortic aneurysm, without rupture: Principal | ICD-10-CM

## 2020-11-16 LAB — CBC
HCT: 37.4 % — ABNORMAL LOW (ref 39.0–52.0)
Hemoglobin: 12.9 g/dL — ABNORMAL LOW (ref 13.0–17.0)
MCH: 32.9 pg (ref 26.0–34.0)
MCHC: 34.5 g/dL (ref 30.0–36.0)
MCV: 95.4 fL (ref 80.0–100.0)
Platelets: 157 10*3/uL (ref 150–400)
RBC: 3.92 MIL/uL — ABNORMAL LOW (ref 4.22–5.81)
RDW: 12.9 % (ref 11.5–15.5)
WBC: 12.5 10*3/uL — ABNORMAL HIGH (ref 4.0–10.5)
nRBC: 0 % (ref 0.0–0.2)

## 2020-11-16 LAB — BASIC METABOLIC PANEL
Anion gap: 8 (ref 5–15)
BUN: 28 mg/dL — ABNORMAL HIGH (ref 8–23)
CO2: 27 mmol/L (ref 22–32)
Calcium: 8.3 mg/dL — ABNORMAL LOW (ref 8.9–10.3)
Chloride: 102 mmol/L (ref 98–111)
Creatinine, Ser: 1.24 mg/dL (ref 0.61–1.24)
GFR, Estimated: 58 mL/min — ABNORMAL LOW (ref >60)
Glucose, Bld: 217 mg/dL — ABNORMAL HIGH (ref 70–99)
Potassium: 4.3 mmol/L (ref 3.5–5.1)
Sodium: 137 mmol/L (ref 135–145)

## 2020-11-16 LAB — GLUCOSE, CAPILLARY: Glucose-Capillary: 113 mg/dL — ABNORMAL HIGH (ref 70–99)

## 2020-11-16 MED ORDER — CHLORHEXIDINE GLUCONATE CLOTH 2 % EX PADS: 6.0000 | MEDICATED_PAD | Freq: Every day | CUTANEOUS | Status: DC

## 2020-11-16 MED ORDER — CLOPIDOGREL BISULFATE 75 MG PO TABS: 75.0000 mg | ORAL_TABLET | Freq: Every day | ORAL | 3 refills | Status: DC

## 2020-11-16 MED ORDER — ATORVASTATIN CALCIUM 20 MG PO TABS: 20.0000 mg | ORAL_TABLET | Freq: Every day | ORAL | 3 refills | Status: DC

## 2020-11-16 NOTE — Progress Notes (Signed)
Pt discharged to home, transported to private vehicle per wheelchair.  D/C instructions given and patient verbalized understanding.  Follow appt. Made and given to patient.  All questions addressed.

## 2020-11-16 NOTE — Discharge Instructions (Signed)
Vascular Surgery Discharge Instructions: 1) you may remove your groin dressing tomorrow and shower.  Gently clean your groins with soap and water.  Gently pat dry. 2) please do not engage in any strenuous activity or lifting greater than 10 pounds until you are cleared at your first postoperative follow-up. 3) please do not drive until you are cleared at your first postoperative follow-up.

## 2020-11-16 NOTE — Plan of Care (Signed)
  Problem: Clinical Measurements: Goal: Cardiovascular complication will be avoided Outcome: Progressing   Problem: Coping: Goal: Level of anxiety will decrease Outcome: Progressing   Problem: Education: Goal: Knowledge of General Education information will improve Description: Including pain rating scale, medication(s)/side effects and non-pharmacologic comfort measures Outcome: Adequate for Discharge   Problem: Health Behavior/Discharge Planning: Goal: Ability to manage health-related needs will improve Outcome: Adequate for Discharge   Problem: Clinical Measurements: Goal: Ability to maintain clinical measurements within normal limits will improve Outcome: Adequate for Discharge Goal: Will remain free from infection Outcome: Adequate for Discharge Goal: Diagnostic test results will improve Outcome: Adequate for Discharge Goal: Respiratory complications will improve Outcome: Adequate for Discharge   Problem: Activity: Goal: Risk for activity intolerance will decrease Outcome: Adequate for Discharge   Problem: Nutrition: Goal: Adequate nutrition will be maintained Outcome: Adequate for Discharge   Problem: Elimination: Goal: Will not experience complications related to bowel motility Outcome: Adequate for Discharge Goal: Will not experience complications related to urinary retention Outcome: Adequate for Discharge   Problem: Pain Managment: Goal: General experience of comfort will improve Outcome: Adequate for Discharge   Problem: Safety: Goal: Ability to remain free from injury will improve Outcome: Adequate for Discharge   Problem: Skin Integrity: Goal: Risk for impaired skin integrity will decrease Outcome: Adequate for Discharge

## 2020-11-16 NOTE — Anesthesia Postprocedure Evaluation (Signed)
Anesthesia Post Note  Patient: Ryan Ali  Procedure(s) Performed: ENDOVASCULAR REPAIR/STENT GRAFT  Patient location during evaluation: SICU Anesthesia Type: General Level of consciousness: awake and alert Pain management: pain level controlled Vital Signs Assessment: post-procedure vital signs reviewed and stable Respiratory status: patient remains intubated per anesthesia plan Cardiovascular status: stable Postop Assessment: no apparent nausea or vomiting Anesthetic complications: no   No notable events documented.   Last Vitals:  Vitals:   11/16/20 0300 11/16/20 0354  BP: (!) 112/59   Pulse: 79   Resp: (!) 26   Temp:  (!) 36.4 C  SpO2: 94%     Last Pain:  Vitals:   11/16/20 0354  TempSrc: Axillary  PainSc:                  Hedda Slade
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
## 2020-11-30 ENCOUNTER — Ambulatory Visit (INDEPENDENT_AMBULATORY_CARE_PROVIDER_SITE_OTHER): Payer: PPO | Admitting: Nurse Practitioner

## 2020-11-30 ENCOUNTER — Encounter (INDEPENDENT_AMBULATORY_CARE_PROVIDER_SITE_OTHER): Payer: Self-pay | Admitting: Nurse Practitioner

## 2020-11-30 ENCOUNTER — Other Ambulatory Visit: Payer: Self-pay

## 2020-11-30 VITALS — BP 120/69 | HR 69 | Ht 67.0 in | Wt 210.0 lb

## 2020-11-30 DIAGNOSIS — I714 Abdominal aortic aneurysm, without rupture, unspecified: Secondary | ICD-10-CM

## 2020-11-30 DIAGNOSIS — M75121 Complete rotator cuff tear or rupture of right shoulder, not specified as traumatic: Secondary | ICD-10-CM | POA: Insufficient documentation

## 2020-11-30 DIAGNOSIS — Z461 Encounter for fitting and adjustment of hearing aid: Secondary | ICD-10-CM | POA: Insufficient documentation

## 2020-11-30 MED ORDER — PANTOPRAZOLE SODIUM 40 MG PO TBEC: 40.0000 mg | DELAYED_RELEASE_TABLET | Freq: Every day | ORAL | 3 refills | Status: DC

## 2020-12-04 ENCOUNTER — Encounter (INDEPENDENT_AMBULATORY_CARE_PROVIDER_SITE_OTHER): Payer: Self-pay | Admitting: Nurse Practitioner

## 2020-12-04 DIAGNOSIS — Z96611 Presence of right artificial shoulder joint: Secondary | ICD-10-CM | POA: Diagnosis not present

## 2020-12-04 DIAGNOSIS — M12811 Other specific arthropathies, not elsewhere classified, right shoulder: Secondary | ICD-10-CM | POA: Diagnosis not present
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
## 2021-01-03 ENCOUNTER — Other Ambulatory Visit: Payer: Self-pay

## 2021-01-03 ENCOUNTER — Ambulatory Visit (INDEPENDENT_AMBULATORY_CARE_PROVIDER_SITE_OTHER): Payer: PPO | Admitting: Family Medicine

## 2021-01-03 ENCOUNTER — Encounter: Payer: Self-pay | Admitting: Family Medicine

## 2021-01-03 DIAGNOSIS — Z23 Encounter for immunization: Secondary | ICD-10-CM

## 2021-01-03 NOTE — Progress Notes (Deleted)
Established patient visit   Patient: Ryan Ali   DOB: August 30, 1940   80 y.o. Male  MRN: [id number] Visit Date: 01/03/2021  Today's healthcare provider: [name], MD   Chief Complaint  Patient presents with   Bowel habit changes   Subjective    HPI  Bowel Habit changes: Patient complains of changes in her bowel. She states she was treated for C diff infection with vancomycin a few weeks ago. She competed all doses of treatment. She is still having nausea and soft thin stools. Yesterday she had an episode of diarrhea. She had some episodes of stool leakage. She also reports excessive flatulence and belching.   {Link to patient history deactivated due to formatting error:1}  Medications: Outpatient Medications Prior to Visit  Medication Sig   albuterol (VENTOLIN HFA) 108 (90 Base) MCG/ACT inhaler Inhale 2 puffs into the lungs every 6 (six) hours as needed for shortness of breath or wheezing.   Ascorbic Acid (VITAMIN C PO) Take 1,400 mg by mouth daily.   aspirin EC 81 MG tablet Take 81 mg by mouth daily. Swallow whole.   ASTAXANTHIN PO Take 12 mg by mouth daily.   atenolol (TENORMIN) 25 MG tablet Take 1 tablet (25 mg total) by mouth every morning.   atorvastatin (LIPITOR) 20 MG tablet Take 1 tablet (20 mg total) by mouth daily.   baclofen (LIORESAL) 10 MG tablet Take 1 tablet (10 mg total) by mouth every 12 (twelve) hours.   brimonidine (ALPHAGAN) 0.2 % ophthalmic solution Place 1 drop into the right eye daily.   budesonide-formoterol (SYMBICORT) 160-4.5 MCG/ACT inhaler Inhale 2 puffs into the lungs 2 (two) times daily.   Calcium Carb-Cholecalciferol 600-800 MG-UNIT CHEW Chew 1 each by mouth daily.   Calcium Carbonate-Vit D-Min (CALCIUM 600+D PLUS MINERALS) 600-400 MG-UNIT CHEW Chew 3 tablets by mouth daily.   Cholecalciferol (VITAMIN D PO) Take 1 tablet by mouth daily. 2000 mg   clopidogrel (PLAVIX) 75 MG tablet Take 1 tablet (75 mg total) by mouth daily at 6 (six)  AM.   ferrous sulfate 325 (65 FE) MG tablet Take 325 mg by mouth daily with breakfast.   Fluticasone-Salmeterol (ADVAIR) 250-50 MCG/DOSE AEPB Inhale 2 puffs into the lungs every morning.   HYDROcodone-acetaminophen (NORCO/VICODIN) 5-325 MG tablet Take 1 tablet by mouth every 6 (six) hours as needed for moderate pain.   levothyroxine (SYNTHROID) 150 MCG tablet Take 1 tablet (150 mcg total) by mouth daily at 6 (six) AM.   magnesium oxide (MAG-OX) 400 MG tablet Take 400 mg by mouth every morning.   pantoprazole (PROTONIX) 40 MG tablet Take 1 tablet (40 mg total) by mouth daily.   tamsulosin (FLOMAX) 0.4 MG CAPS capsule Take 0.4 mg by mouth.   tiotropium (SPIRIVA) 18 MCG inhalation capsule Place 18 mcg into inhaler and inhale 2 (two) times daily.   No facility-administered medications prior to visit.    Review of Systems  Constitutional:  Negative for appetite change, chills and fever.  Respiratory:  Negative for chest tightness, shortness of breath and wheezing.   Cardiovascular:  Negative for chest pain and palpitations.  Gastrointestinal:  Positive for diarrhea and nausea. Negative for abdominal pain and vomiting.   {Labs  Heme  Chem  Endocrine  Serology  Results Review (optional):23779}   Objective    BP (!) 148/64 (BP Location: Left Arm, Patient Position: Sitting, Cuff Size: Large)   Pulse 65   Temp 97.9 F (36.6 C) (Oral)   Resp  20   Wt 192 lb (87.1 kg)   SpO2 98% Comment: room air  BMI 30.07 kg/m  {Show previous vital signs (optional):23777}  Physical Exam  ***  No results found for any visits on 01/03/21.  Assessment & Plan     ***  No follow-ups on file.      {provider attestation***:1}   [name], MD  [facility] [phone] (phone) [phone] (fax)  [place]

## 2021-01-16 ENCOUNTER — Telehealth (INDEPENDENT_AMBULATORY_CARE_PROVIDER_SITE_OTHER): Payer: PPO | Admitting: Family Medicine

## 2021-01-16 ENCOUNTER — Encounter: Payer: Self-pay | Admitting: Family Medicine

## 2021-01-16 VITALS — BP 98/68 | HR 106

## 2021-01-16 DIAGNOSIS — C9001 Multiple myeloma in remission: Secondary | ICD-10-CM

## 2021-01-16 DIAGNOSIS — E890 Postprocedural hypothyroidism: Secondary | ICD-10-CM | POA: Diagnosis not present

## 2021-01-16 DIAGNOSIS — D649 Anemia, unspecified: Secondary | ICD-10-CM | POA: Diagnosis not present

## 2021-01-16 DIAGNOSIS — D696 Thrombocytopenia, unspecified: Secondary | ICD-10-CM

## 2021-01-16 DIAGNOSIS — R630 Anorexia: Secondary | ICD-10-CM

## 2021-01-16 DIAGNOSIS — R634 Abnormal weight loss: Secondary | ICD-10-CM

## 2021-01-16 DIAGNOSIS — E1159 Type 2 diabetes mellitus with other circulatory complications: Secondary | ICD-10-CM

## 2021-01-16 NOTE — Progress Notes (Signed)
  Virtual telephone visit    Virtual Visit via Telephone Note   This visit type was conducted due to national recommendations for restrictions regarding the COVID-19 Pandemic (e.g. social distancing) in an effort to limit this patient's exposure and mitigate transmission in our community. Due to his co-morbid illnesses, this patient is at least at moderate risk for complications without adequate follow up. This format is felt to be most appropriate for this patient at this time. The patient did not have access to video technology or had technical difficulties with video requiring transitioning to audio format only (telephone). Physical exam was limited to content and character of the telephone converstion.    Patient location: home Provider location: [facility]  I discussed the limitations of evaluation and management by telemedicine and the availability of in person appointments. The patient expressed understanding and agreed to proceed.   Visit Date: 01/16/2021  Today's healthcare provider: Donald Fisher, MD   Chief Complaint  Patient presents with   Prediabetes   Fatigue   Subjective    HPI  Prediabetes, Follow-up  Lab Results  Component Value Date   HGBA1C 6.3 (A) 06/27/2020   HGBA1C 7.0 (H) 11/01/2019   HGBA1C 6.4 (H) 05/16/2015   GLUCOSE 217 (H) 11/16/2020   GLUCOSE 114 (H) 11/14/2020   GLUCOSE 202 (H) 05/25/2020    Last seen for for this 6 months ago.  Management since that visit includes encouraging healthier eating habits and weight loss.  Current symptoms include none and have been stable.  Prior visit with dietician: no Current diet: has not been eating as much due to lack of appetite Current exercise: none  Pertinent Labs:    Component Value Date/Time   CHOL 184 05/16/2015 0918   TRIG 102 05/16/2015 0918   CHOLHDL 4.2 05/16/2015 0918   CREATININE 1.24 11/16/2020 0546   CREATININE 1.29 11/29/2011 0851    Wt Readings from Last 3 Encounters:  11/30/20 210  lb (95.3 kg)  11/15/20 208 lb 1.8 oz (94.4 kg)  11/13/20 210 lb (95.3 kg)    -----------------------------------------------------------------------------------------   Fatigue: Patient's wife reports that for the past 2 months patient has been more fatigued. She also reports associated symptoms of chills, night sweats, decrease in appetite and weight loss. She reports that patient has lost 30 pounds in the last 3-4 months. His blood pressure was low a week ago (84/49), so she stopped his blood pressure medication (Atenolol). She also stopped his Atorvastatin 3 weeks ago due to possible cause of patient experiencing muscle aches. Patient has had some shortness of breath, but she reports it is unchanged from baseline.  Patient received his 2nd COVID booster yesterday and he seems to be more achy today. His blood pressure today was 98/68.    Medications: Outpatient Medications Prior to Visit  Medication Sig   albuterol (VENTOLIN HFA) 108 (90 Base) MCG/ACT inhaler Inhale 2 puffs into the lungs every 6 (six) hours as needed for shortness of breath or wheezing.   Ascorbic Acid (VITAMIN C PO) Take 1,400 mg by mouth daily.   aspirin EC 81 MG tablet Take 81 mg by mouth daily. Swallow whole.   ASTAXANTHIN PO Take 12 mg by mouth daily.   atenolol (TENORMIN) 25 MG tablet Take 1 tablet (25 mg total) by mouth every morning. (Patient not taking: Reported on 01/16/2021)   atorvastatin (LIPITOR) 20 MG tablet Take 1 tablet (20 mg total) by mouth daily.   baclofen (LIORESAL) 10 MG tablet Take 1 tablet (10   mg total) by mouth every 12 (twelve) hours.   brimonidine (ALPHAGAN) 0.2 % ophthalmic solution Place 1 drop into the right eye daily.   budesonide-formoterol (SYMBICORT) 160-4.5 MCG/ACT inhaler Inhale 2 puffs into the lungs 2 (two) times daily.   Calcium Carb-Cholecalciferol 600-800 MG-UNIT CHEW Chew 1 each by mouth daily.   Calcium Carbonate-Vit D-Min (CALCIUM 600+D PLUS MINERALS) 600-400 MG-UNIT CHEW Chew 3  tablets by mouth daily.   Cholecalciferol (VITAMIN D PO) Take 1 tablet by mouth daily. 2000 mg   clopidogrel (PLAVIX) 75 MG tablet Take 1 tablet (75 mg total) by mouth daily at 6 (six) AM.   ferrous sulfate 325 (65 FE) MG tablet Take 325 mg by mouth daily with breakfast.   Fluticasone-Salmeterol (ADVAIR) 250-50 MCG/DOSE AEPB Inhale 2 puffs into the lungs every morning.   HYDROcodone-acetaminophen (NORCO/VICODIN) 5-325 MG tablet Take 1 tablet by mouth every 6 (six) hours as needed for moderate pain.   levothyroxine (SYNTHROID) 150 MCG tablet Take 1 tablet (150 mcg total) by mouth daily at 6 (six) AM.   magnesium oxide (MAG-OX) 400 MG tablet Take 400 mg by mouth every morning.   pantoprazole (PROTONIX) 40 MG tablet Take 1 tablet (40 mg total) by mouth daily.   tamsulosin (FLOMAX) 0.4 MG CAPS capsule Take 0.4 mg by mouth.   tiotropium (SPIRIVA) 18 MCG inhalation capsule Place 18 mcg into inhaler and inhale 2 (two) times daily.   No facility-administered medications prior to visit.    Review of Systems  Constitutional:  Positive for activity change, appetite change, chills, diaphoresis, fatigue and unexpected weight change. Negative for fever.  Respiratory:  Positive for shortness of breath. Negative for chest tightness and wheezing.   Cardiovascular:  Negative for chest pain and palpitations.  Gastrointestinal:  Negative for abdominal pain, nausea and vomiting.  Musculoskeletal:  Positive for myalgias.     Objective    There were no vitals taken for this visit.   Awake, alert, oriented x 3. In no apparent distress   Assessment & Plan     1. Loss of appetite   2. Abnormal weight loss  - Lipase  3. Type 2 diabetes mellitus with other circulatory complication, unspecified whether long term insulin use (HCC)  - Hemoglobin A1c  4. Hypothyroidism, postop  - TSH - T4, free  5. Anemia, unspecified type  - CBC with Differential/Platelet - Iron, TIBC and Ferritin Panel  6.  Thrombocytopenia ([place])   7. Multiple myeloma in remission (HCC)  - Comprehensive metabolic panel - Protein Electrophoresis, (serum)       I discussed the assessment and treatment plan with the patient. The patient was provided an opportunity to ask questions and all were answered. The patient agreed with the plan and demonstrated an understanding of the instructions.   The patient was advised to call back or seek an in-person evaluation if the symptoms worsen or if the condition fails to improve as anticipated.  I provided 12 minutes of non-face-to-face time during this encounter.  The entirety of the information documented in the History of Present Illness, Review of Systems and Physical Exam were personally obtained by me. Portions of this information were initially documented by the CMA and reviewed by me for thoroughness and accuracy.    [name], MD [facility] [phone] (phone) [phone] (fax)  [place]

## 2021-01-16 NOTE — Patient Instructions (Signed)
.   Please review the attached list of medications and notify my office if there are any errors.   . Please bring all of your medications to every appointment so we can make sure that our medication list is the same as yours.   

## 2021-01-16 NOTE — Addendum Note (Signed)
Addended by: Birdie Sons on: 01/16/2021 03:14 PM   Modules accepted: Orders

## 2021-01-17 DIAGNOSIS — C9001 Multiple myeloma in remission: Secondary | ICD-10-CM | POA: Diagnosis not present

## 2021-01-17 DIAGNOSIS — R634 Abnormal weight loss: Secondary | ICD-10-CM | POA: Diagnosis not present

## 2021-01-17 DIAGNOSIS — E890 Postprocedural hypothyroidism: Secondary | ICD-10-CM | POA: Diagnosis not present

## 2021-01-17 DIAGNOSIS — D649 Anemia, unspecified: Secondary | ICD-10-CM | POA: Diagnosis not present

## 2021-01-17 DIAGNOSIS — E1159 Type 2 diabetes mellitus with other circulatory complications: Secondary | ICD-10-CM | POA: Diagnosis not present

## 2021-01-18 LAB — LIPID PANEL
Chol/HDL Ratio: 4.6 ratio (ref 0.0–5.0)
Cholesterol, Total: 142 mg/dL (ref 100–199)
HDL: 31 mg/dL — ABNORMAL LOW (ref >39)
LDL Chol Calc (NIH): 91 mg/dL (ref 0–99)
Triglycerides: 106 mg/dL (ref 0–149)
VLDL Cholesterol Cal: 20 mg/dL (ref 5–40)

## 2021-01-19 LAB — COMPREHENSIVE METABOLIC PANEL
ALT: 15 IU/L (ref 0–44)
AST: 19 IU/L (ref 0–40)
Albumin/Globulin Ratio: 1.2 (ref 1.2–2.2)
Albumin: 3.7 g/dL (ref 3.6–4.6)
Alkaline Phosphatase: 131 IU/L — ABNORMAL HIGH (ref 44–121)
BUN/Creatinine Ratio: 18 (ref 10–24)
BUN: 26 mg/dL (ref 8–27)
Bilirubin Total: 0.6 mg/dL (ref 0.0–1.2)
CO2: 22 mmol/L (ref 20–29)
Calcium: 9 mg/dL (ref 8.6–10.2)
Chloride: 102 mmol/L (ref 96–106)
Creatinine, Ser: 1.47 mg/dL — ABNORMAL HIGH (ref 0.76–1.27)
Globulin, Total: 3.2 g/dL (ref 1.5–4.5)
Glucose: 115 mg/dL — ABNORMAL HIGH (ref 70–99)
Potassium: 4.9 mmol/L (ref 3.5–5.2)
Sodium: 139 mmol/L (ref 134–144)
Total Protein: 6.9 g/dL (ref 6.0–8.5)
eGFR: 48 mL/min/{1.73_m2} — ABNORMAL LOW (ref >59)

## 2021-01-19 LAB — PROTEIN ELECTROPHORESIS, SERUM
A/G Ratio: 0.8 (ref 0.7–1.7)
Albumin ELP: 3.1 g/dL (ref 2.9–4.4)
Alpha 1: 0.4 g/dL (ref 0.0–0.4)
Alpha 2: 1.1 g/dL — ABNORMAL HIGH (ref 0.4–1.0)
Beta: 0.9 g/dL (ref 0.7–1.3)
Gamma Globulin: 1.4 g/dL (ref 0.4–1.8)
Globulin, Total: 3.8 g/dL (ref 2.2–3.9)
M-Spike, %: 0.4 g/dL — ABNORMAL HIGH

## 2021-01-19 LAB — CBC WITH DIFFERENTIAL/PLATELET
Basophils Absolute: 0 10*3/uL (ref 0.0–0.2)
Basos: 1 %
EOS (ABSOLUTE): 0.1 10*3/uL (ref 0.0–0.4)
Eos: 2 %
Hematocrit: 40.4 % (ref 37.5–51.0)
Hemoglobin: 13.4 g/dL (ref 13.0–17.7)
Immature Grans (Abs): 0 10*3/uL (ref 0.0–0.1)
Immature Granulocytes: 1 %
Lymphocytes Absolute: 1.7 10*3/uL (ref 0.7–3.1)
Lymphs: 25 %
MCH: 31.8 pg (ref 26.6–33.0)
MCHC: 33.2 g/dL (ref 31.5–35.7)
MCV: 96 fL (ref 79–97)
Monocytes Absolute: 0.5 10*3/uL (ref 0.1–0.9)
Monocytes: 7 %
Neutrophils Absolute: 4.4 10*3/uL (ref 1.4–7.0)
Neutrophils: 64 %
Platelets: 222 10*3/uL (ref 150–450)
RBC: 4.21 x10E6/uL (ref 4.14–5.80)
RDW: 13.1 % (ref 11.6–15.4)
WBC: 6.9 10*3/uL (ref 3.4–10.8)

## 2021-01-19 LAB — IRON,TIBC AND FERRITIN PANEL
Ferritin: 562 ng/mL — ABNORMAL HIGH (ref 30–400)
Iron Saturation: 31 % (ref 15–55)
Iron: 59 ug/dL (ref 38–169)
Total Iron Binding Capacity: 190 ug/dL — ABNORMAL LOW (ref 250–450)
UIBC: 131 ug/dL (ref 111–343)

## 2021-01-19 LAB — HEMOGLOBIN A1C
Est. average glucose Bld gHb Est-mCnc: 126 mg/dL
Hgb A1c MFr Bld: 6 % — ABNORMAL HIGH (ref 4.8–5.6)

## 2021-01-19 LAB — TSH: TSH: 0.718 u[IU]/mL (ref 0.450–4.500)

## 2021-01-19 LAB — LIPASE: Lipase: 17 U/L (ref 13–78)

## 2021-01-19 LAB — T4, FREE: Free T4: 2.11 ng/dL — ABNORMAL HIGH (ref 0.82–1.77)

## 2021-01-22 ENCOUNTER — Other Ambulatory Visit: Payer: Self-pay | Admitting: *Deleted

## 2021-01-24 ENCOUNTER — Telehealth: Payer: Self-pay

## 2021-01-24 DIAGNOSIS — E890 Postprocedural hypothyroidism: Secondary | ICD-10-CM

## 2021-01-24 MED ORDER — LEVOTHYROXINE SODIUM 125 MCG PO TABS: 125.0000 ug | ORAL_TABLET | Freq: Every day | ORAL | 1 refills | Status: DC

## 2021-01-24 NOTE — Telephone Encounter (Signed)
-----   Message from Birdie Sons, MD sent at 01/22/2021  8:20 AM EDT ----- Slight decline in kidney functions, he needs to drink more water. A1c is good at 6.0% He's a little hyperthyroid, which could be causing some weight loss. Need to reduce levothyroxine to 145mcg once a day day, #90, rf x 1. Need tor recheck  renal panel, TSH and T4 in 2 months.  Cholesterol is very good.  There is a very small M-spike but his albumin and globulin levels are normal, so I doubt that this M-spike is a concern. However he should follow up with hematologist to review.

## 2021-01-24 NOTE — Telephone Encounter (Signed)
Patient wife advised and verbalized understanding. She agrees with medication changes. Prescription sent into pharmacy. Follow up appointment scheduled.

## 2021-02-08 ENCOUNTER — Other Ambulatory Visit (INDEPENDENT_AMBULATORY_CARE_PROVIDER_SITE_OTHER): Payer: Self-pay | Admitting: Nurse Practitioner

## 2021-02-10 ENCOUNTER — Encounter (INDEPENDENT_AMBULATORY_CARE_PROVIDER_SITE_OTHER): Payer: Self-pay | Admitting: Vascular Surgery

## 2021-02-26 DIAGNOSIS — H401111 Primary open-angle glaucoma, right eye, mild stage: Secondary | ICD-10-CM | POA: Diagnosis not present

## 2021-03-02 ENCOUNTER — Ambulatory Visit (INDEPENDENT_AMBULATORY_CARE_PROVIDER_SITE_OTHER): Payer: PPO | Admitting: Nurse Practitioner

## 2021-03-02 ENCOUNTER — Other Ambulatory Visit: Payer: Self-pay

## 2021-03-02 ENCOUNTER — Other Ambulatory Visit (INDEPENDENT_AMBULATORY_CARE_PROVIDER_SITE_OTHER): Payer: PPO

## 2021-03-02 ENCOUNTER — Encounter (INDEPENDENT_AMBULATORY_CARE_PROVIDER_SITE_OTHER): Payer: Self-pay | Admitting: Nurse Practitioner

## 2021-03-02 VITALS — BP 128/82 | HR 70 | Resp 16 | Wt 207.4 lb

## 2021-03-02 DIAGNOSIS — I7143 Infrarenal abdominal aortic aneurysm, without rupture: Secondary | ICD-10-CM

## 2021-03-02 DIAGNOSIS — E1159 Type 2 diabetes mellitus with other circulatory complications: Secondary | ICD-10-CM

## 2021-03-02 DIAGNOSIS — I10 Essential (primary) hypertension: Secondary | ICD-10-CM | POA: Diagnosis not present

## 2021-03-03 ENCOUNTER — Encounter (INDEPENDENT_AMBULATORY_CARE_PROVIDER_SITE_OTHER): Payer: Self-pay | Admitting: Nurse Practitioner
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
## 2021-03-23 ENCOUNTER — Emergency Department: Payer: No Typology Code available for payment source

## 2021-03-23 ENCOUNTER — Encounter: Payer: Self-pay | Admitting: Emergency Medicine

## 2021-03-23 ENCOUNTER — Inpatient Hospital Stay
Admission: EM | Admit: 2021-03-23 | Discharge: 2021-03-29 | DRG: 190 | Disposition: A | Discharge status: Home Health | Payer: No Typology Code available for payment source | Attending: Hospitalist | Admitting: Hospitalist

## 2021-03-23 ENCOUNTER — Other Ambulatory Visit: Payer: Self-pay

## 2021-03-23 DIAGNOSIS — E119 Type 2 diabetes mellitus without complications: Secondary | ICD-10-CM | POA: Diagnosis present

## 2021-03-23 DIAGNOSIS — N210 Calculus in bladder: Secondary | ICD-10-CM | POA: Diagnosis not present

## 2021-03-23 DIAGNOSIS — Z86711 Personal history of pulmonary embolism: Secondary | ICD-10-CM | POA: Diagnosis present

## 2021-03-23 DIAGNOSIS — I2510 Atherosclerotic heart disease of native coronary artery without angina pectoris: Secondary | ICD-10-CM | POA: Diagnosis not present

## 2021-03-23 DIAGNOSIS — J449 Chronic obstructive pulmonary disease, unspecified: Secondary | ICD-10-CM | POA: Diagnosis present

## 2021-03-23 DIAGNOSIS — Z7902 Long term (current) use of antithrombotics/antiplatelets: Secondary | ICD-10-CM

## 2021-03-23 DIAGNOSIS — K219 Gastro-esophageal reflux disease without esophagitis: Secondary | ICD-10-CM | POA: Diagnosis present

## 2021-03-23 DIAGNOSIS — Z79890 Hormone replacement therapy (postmenopausal): Secondary | ICD-10-CM

## 2021-03-23 DIAGNOSIS — Z87891 Personal history of nicotine dependence: Secondary | ICD-10-CM

## 2021-03-23 DIAGNOSIS — R062 Wheezing: Secondary | ICD-10-CM | POA: Diagnosis not present

## 2021-03-23 DIAGNOSIS — R634 Abnormal weight loss: Secondary | ICD-10-CM | POA: Diagnosis not present

## 2021-03-23 DIAGNOSIS — R531 Weakness: Secondary | ICD-10-CM | POA: Diagnosis not present

## 2021-03-23 DIAGNOSIS — N400 Enlarged prostate without lower urinary tract symptoms: Secondary | ICD-10-CM | POA: Diagnosis present

## 2021-03-23 DIAGNOSIS — K449 Diaphragmatic hernia without obstruction or gangrene: Secondary | ICD-10-CM | POA: Diagnosis not present

## 2021-03-23 DIAGNOSIS — J849 Interstitial pulmonary disease, unspecified: Secondary | ICD-10-CM | POA: Diagnosis not present

## 2021-03-23 DIAGNOSIS — I5021 Acute systolic (congestive) heart failure: Secondary | ICD-10-CM

## 2021-03-23 DIAGNOSIS — Z20822 Contact with and (suspected) exposure to covid-19: Secondary | ICD-10-CM | POA: Diagnosis present

## 2021-03-23 DIAGNOSIS — Z8579 Personal history of other malignant neoplasms of lymphoid, hematopoietic and related tissues: Secondary | ICD-10-CM

## 2021-03-23 DIAGNOSIS — Z7982 Long term (current) use of aspirin: Secondary | ICD-10-CM

## 2021-03-23 DIAGNOSIS — Z87442 Personal history of urinary calculi: Secondary | ICD-10-CM

## 2021-03-23 DIAGNOSIS — Z8679 Personal history of other diseases of the circulatory system: Secondary | ICD-10-CM

## 2021-03-23 DIAGNOSIS — Z8249 Family history of ischemic heart disease and other diseases of the circulatory system: Secondary | ICD-10-CM

## 2021-03-23 DIAGNOSIS — R0902 Hypoxemia: Secondary | ICD-10-CM

## 2021-03-23 DIAGNOSIS — R52 Pain, unspecified: Secondary | ICD-10-CM | POA: Diagnosis not present

## 2021-03-23 DIAGNOSIS — Z833 Family history of diabetes mellitus: Secondary | ICD-10-CM

## 2021-03-23 DIAGNOSIS — J439 Emphysema, unspecified: Secondary | ICD-10-CM | POA: Diagnosis not present

## 2021-03-23 DIAGNOSIS — I10 Essential (primary) hypertension: Secondary | ICD-10-CM | POA: Diagnosis present

## 2021-03-23 DIAGNOSIS — E890 Postprocedural hypothyroidism: Secondary | ICD-10-CM | POA: Diagnosis present

## 2021-03-23 DIAGNOSIS — J9601 Acute respiratory failure with hypoxia: Secondary | ICD-10-CM | POA: Diagnosis present

## 2021-03-23 DIAGNOSIS — H9190 Unspecified hearing loss, unspecified ear: Secondary | ICD-10-CM | POA: Diagnosis present

## 2021-03-23 DIAGNOSIS — N179 Acute kidney failure, unspecified: Secondary | ICD-10-CM | POA: Diagnosis not present

## 2021-03-23 DIAGNOSIS — R7303 Prediabetes: Secondary | ICD-10-CM | POA: Diagnosis present

## 2021-03-23 DIAGNOSIS — Z8051 Family history of malignant neoplasm of kidney: Secondary | ICD-10-CM

## 2021-03-23 DIAGNOSIS — Z96611 Presence of right artificial shoulder joint: Secondary | ICD-10-CM | POA: Diagnosis present

## 2021-03-23 DIAGNOSIS — K4090 Unilateral inguinal hernia, without obstruction or gangrene, not specified as recurrent: Secondary | ICD-10-CM | POA: Diagnosis not present

## 2021-03-23 DIAGNOSIS — Z7951 Long term (current) use of inhaled steroids: Secondary | ICD-10-CM

## 2021-03-23 DIAGNOSIS — Z823 Family history of stroke: Secondary | ICD-10-CM

## 2021-03-23 DIAGNOSIS — E875 Hyperkalemia: Secondary | ICD-10-CM | POA: Diagnosis present

## 2021-03-23 DIAGNOSIS — Z9481 Bone marrow transplant status: Secondary | ICD-10-CM

## 2021-03-23 DIAGNOSIS — E785 Hyperlipidemia, unspecified: Secondary | ICD-10-CM | POA: Diagnosis present

## 2021-03-23 DIAGNOSIS — J441 Chronic obstructive pulmonary disease with (acute) exacerbation: Secondary | ICD-10-CM | POA: Diagnosis not present

## 2021-03-23 DIAGNOSIS — N281 Cyst of kidney, acquired: Secondary | ICD-10-CM | POA: Diagnosis not present

## 2021-03-23 DIAGNOSIS — E869 Volume depletion, unspecified: Secondary | ICD-10-CM | POA: Diagnosis present

## 2021-03-23 DIAGNOSIS — I509 Heart failure, unspecified: Secondary | ICD-10-CM

## 2021-03-23 DIAGNOSIS — Z79899 Other long term (current) drug therapy: Secondary | ICD-10-CM

## 2021-03-23 LAB — COMPREHENSIVE METABOLIC PANEL
ALT: 13 U/L (ref 0–44)
AST: 33 U/L (ref 15–41)
Albumin: 4 g/dL (ref 3.5–5.0)
Alkaline Phosphatase: 87 U/L (ref 38–126)
Anion gap: 7 (ref 5–15)
BUN: 35 mg/dL — ABNORMAL HIGH (ref 8–23)
CO2: 23 mmol/L (ref 22–32)
Calcium: 8.5 mg/dL — ABNORMAL LOW (ref 8.9–10.3)
Chloride: 101 mmol/L (ref 98–111)
Creatinine, Ser: 2.16 mg/dL — ABNORMAL HIGH (ref 0.61–1.24)
GFR, Estimated: 30 mL/min — ABNORMAL LOW (ref >60)
Glucose, Bld: 120 mg/dL — ABNORMAL HIGH (ref 70–99)
Potassium: 4.5 mmol/L (ref 3.5–5.1)
Sodium: 131 mmol/L — ABNORMAL LOW (ref 135–145)
Total Bilirubin: 1 mg/dL (ref 0.3–1.2)
Total Protein: 7.3 g/dL (ref 6.5–8.1)

## 2021-03-23 LAB — URINALYSIS, COMPLETE (UACMP) WITH MICROSCOPIC
Bacteria, UA: NONE SEEN
Bilirubin Urine: NEGATIVE
Glucose, UA: NEGATIVE mg/dL
Hgb urine dipstick: NEGATIVE
Ketones, ur: NEGATIVE mg/dL
Leukocytes,Ua: NEGATIVE
Nitrite: NEGATIVE
Protein, ur: NEGATIVE mg/dL
Specific Gravity, Urine: 1.014 (ref 1.005–1.030)
pH: 5 (ref 5.0–8.0)

## 2021-03-23 LAB — APTT: aPTT: 30 seconds (ref 24–36)

## 2021-03-23 LAB — TROPONIN I (HIGH SENSITIVITY)
Troponin I (High Sensitivity): 20 ng/L — ABNORMAL HIGH (ref <18)
Troponin I (High Sensitivity): 20 ng/L — ABNORMAL HIGH (ref <18)

## 2021-03-23 LAB — CBC WITH DIFFERENTIAL/PLATELET
Abs Immature Granulocytes: 0.03 10*3/uL (ref 0.00–0.07)
Basophils Absolute: 0 10*3/uL (ref 0.0–0.1)
Basophils Relative: 1 %
Eosinophils Absolute: 0 10*3/uL (ref 0.0–0.5)
Eosinophils Relative: 0 %
HCT: 45.2 % (ref 39.0–52.0)
Hemoglobin: 14.9 g/dL (ref 13.0–17.0)
Immature Granulocytes: 1 %
Lymphocytes Relative: 10 %
Lymphs Abs: 0.6 10*3/uL — ABNORMAL LOW (ref 0.7–4.0)
MCH: 31.7 pg (ref 26.0–34.0)
MCHC: 33 g/dL (ref 30.0–36.0)
MCV: 96.2 fL (ref 80.0–100.0)
Monocytes Absolute: 0.2 10*3/uL (ref 0.1–1.0)
Monocytes Relative: 4 %
Neutro Abs: 4.8 10*3/uL (ref 1.7–7.7)
Neutrophils Relative %: 84 %
Platelets: 119 10*3/uL — ABNORMAL LOW (ref 150–400)
RBC: 4.7 MIL/uL (ref 4.22–5.81)
RDW: 13.5 % (ref 11.5–15.5)
WBC: 5.7 10*3/uL (ref 4.0–10.5)
nRBC: 0 % (ref 0.0–0.2)

## 2021-03-23 LAB — PROTIME-INR
INR: 1.1 (ref 0.8–1.2)
Prothrombin Time: 13.7 seconds (ref 11.4–15.2)

## 2021-03-23 LAB — BLOOD GAS, VENOUS
Acid-base deficit: 2.3 mmol/L — ABNORMAL HIGH (ref 0.0–2.0)
Bicarbonate: 24.2 mmol/L (ref 20.0–28.0)
O2 Saturation: 34.2 %
Patient temperature: 37
pCO2, Ven: 47 mmHg (ref 44.0–60.0)
pH, Ven: 7.32 (ref 7.250–7.430)

## 2021-03-23 LAB — LACTIC ACID, PLASMA
Lactic Acid, Venous: 1.9 mmol/L (ref 0.5–1.9)
Lactic Acid, Venous: 1.9 mmol/L (ref 0.5–1.9)

## 2021-03-23 LAB — BRAIN NATRIURETIC PEPTIDE: B Natriuretic Peptide: 154.7 pg/mL — ABNORMAL HIGH (ref 0.0–100.0)

## 2021-03-23 LAB — RESP PANEL BY RT-PCR (FLU A&B, COVID) ARPGX2
Influenza A by PCR: NEGATIVE
Influenza B by PCR: NEGATIVE
SARS Coronavirus 2 by RT PCR: NEGATIVE

## 2021-03-23 LAB — PROCALCITONIN: Procalcitonin: 0.28 ng/mL

## 2021-03-23 MED ORDER — ONDANSETRON HCL 4 MG/2ML IJ SOLN
4.0000 mg | Freq: Four times a day (QID) | INTRAMUSCULAR | Status: DC | PRN
  Administered 2021-03-26 – 2021-03-27 (×2): 4 mg via INTRAVENOUS
  Filled 2021-03-23 (×2): qty 2

## 2021-03-23 MED ORDER — IPRATROPIUM-ALBUTEROL 0.5-2.5 (3) MG/3ML IN SOLN
3.0000 mL | Freq: Three times a day (TID) | RESPIRATORY_TRACT | Status: AC
  Administered 2021-03-23 – 2021-03-24 (×3): 3 mL via RESPIRATORY_TRACT
  Filled 2021-03-23 (×3): qty 3

## 2021-03-23 MED ORDER — ACETAMINOPHEN 500 MG PO TABS
1000.0000 mg | ORAL_TABLET | Freq: Four times a day (QID) | ORAL | Status: AC | PRN
  Filled 2021-03-23: qty 2

## 2021-03-23 MED ORDER — ACETAMINOPHEN 650 MG RE SUPP: 650.0000 mg | Freq: Four times a day (QID) | RECTAL | Status: AC | PRN

## 2021-03-23 MED ORDER — HEPARIN SODIUM (PORCINE) 5000 UNIT/ML IJ SOLN
5000.0000 [IU] | Freq: Three times a day (TID) | INTRAMUSCULAR | Status: DC
  Administered 2021-03-23 – 2021-03-28 (×14): 5000 [IU] via SUBCUTANEOUS
  Filled 2021-03-23 (×16): qty 1

## 2021-03-23 MED ORDER — ONDANSETRON HCL 4 MG PO TABS: 4.0000 mg | ORAL_TABLET | Freq: Four times a day (QID) | ORAL | Status: DC | PRN

## 2021-03-23 MED ORDER — FUROSEMIDE 10 MG/ML IJ SOLN
40.0000 mg | Freq: Once | INTRAMUSCULAR | Status: AC
  Administered 2021-03-23: 19:00:00 40 mg via INTRAVENOUS
  Filled 2021-03-23: qty 4

## 2021-03-23 MED ORDER — IPRATROPIUM-ALBUTEROL 0.5-2.5 (3) MG/3ML IN SOLN
6.0000 mL | Freq: Once | RESPIRATORY_TRACT | Status: AC
  Administered 2021-03-23: 16:00:00 6 mL via RESPIRATORY_TRACT
  Filled 2021-03-23: qty 6

## 2021-03-23 MED ORDER — METHYLPREDNISOLONE SODIUM SUCC 125 MG IJ SOLR
125.0000 mg | Freq: Once | INTRAMUSCULAR | Status: AC
  Administered 2021-03-23: 16:00:00 125 mg via INTRAVENOUS
  Filled 2021-03-23: qty 2
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
## 2021-03-23 NOTE — ED Triage Notes (Signed)
Pt coming from home via [facility]. Per EMS pt has been feeling generalized weakness for the past few days. Pt c/o of SOB with cough..  Pt states he fel this way when he had got diagnosed with pneumonia. EMS gave one duoneb treatment.   Pt is currently taking antibiotics of UTI. Pt does not know what antbiotics he is taking for the UTI. Pt is scheduled to have surgery for kidney stones in January.

## 2021-03-23 NOTE — ED Notes (Signed)
Pt alert, resting in bed talking to RN at this time.

## 2021-03-24 DIAGNOSIS — I509 Heart failure, unspecified: Secondary | ICD-10-CM | POA: Diagnosis not present

## 2021-03-24 DIAGNOSIS — Z86711 Personal history of pulmonary embolism: Secondary | ICD-10-CM | POA: Diagnosis not present

## 2021-03-24 DIAGNOSIS — Z8249 Family history of ischemic heart disease and other diseases of the circulatory system: Secondary | ICD-10-CM | POA: Diagnosis not present

## 2021-03-24 DIAGNOSIS — R634 Abnormal weight loss: Secondary | ICD-10-CM | POA: Diagnosis present

## 2021-03-24 DIAGNOSIS — Z833 Family history of diabetes mellitus: Secondary | ICD-10-CM | POA: Diagnosis not present

## 2021-03-24 DIAGNOSIS — E869 Volume depletion, unspecified: Secondary | ICD-10-CM | POA: Diagnosis present

## 2021-03-24 DIAGNOSIS — E119 Type 2 diabetes mellitus without complications: Secondary | ICD-10-CM | POA: Diagnosis present

## 2021-03-24 DIAGNOSIS — K219 Gastro-esophageal reflux disease without esophagitis: Secondary | ICD-10-CM | POA: Diagnosis present

## 2021-03-24 DIAGNOSIS — N179 Acute kidney failure, unspecified: Secondary | ICD-10-CM | POA: Diagnosis present

## 2021-03-24 DIAGNOSIS — E890 Postprocedural hypothyroidism: Secondary | ICD-10-CM | POA: Diagnosis present

## 2021-03-24 DIAGNOSIS — Z87891 Personal history of nicotine dependence: Secondary | ICD-10-CM | POA: Diagnosis not present

## 2021-03-24 DIAGNOSIS — I10 Essential (primary) hypertension: Secondary | ICD-10-CM | POA: Diagnosis present

## 2021-03-24 DIAGNOSIS — Z823 Family history of stroke: Secondary | ICD-10-CM | POA: Diagnosis not present

## 2021-03-24 DIAGNOSIS — H9190 Unspecified hearing loss, unspecified ear: Secondary | ICD-10-CM | POA: Diagnosis present

## 2021-03-24 DIAGNOSIS — Z96611 Presence of right artificial shoulder joint: Secondary | ICD-10-CM | POA: Diagnosis present

## 2021-03-24 DIAGNOSIS — Z20822 Contact with and (suspected) exposure to covid-19: Secondary | ICD-10-CM | POA: Diagnosis present

## 2021-03-24 DIAGNOSIS — N400 Enlarged prostate without lower urinary tract symptoms: Secondary | ICD-10-CM | POA: Diagnosis present

## 2021-03-24 DIAGNOSIS — E875 Hyperkalemia: Secondary | ICD-10-CM | POA: Diagnosis present

## 2021-03-24 DIAGNOSIS — Z9481 Bone marrow transplant status: Secondary | ICD-10-CM | POA: Diagnosis not present

## 2021-03-24 DIAGNOSIS — Z79899 Other long term (current) drug therapy: Secondary | ICD-10-CM | POA: Diagnosis not present

## 2021-03-24 DIAGNOSIS — J9601 Acute respiratory failure with hypoxia: Secondary | ICD-10-CM | POA: Diagnosis present

## 2021-03-24 DIAGNOSIS — J441 Chronic obstructive pulmonary disease with (acute) exacerbation: Secondary | ICD-10-CM | POA: Diagnosis present

## 2021-03-24 DIAGNOSIS — Z8051 Family history of malignant neoplasm of kidney: Secondary | ICD-10-CM | POA: Diagnosis not present

## 2021-03-24 DIAGNOSIS — E785 Hyperlipidemia, unspecified: Secondary | ICD-10-CM | POA: Diagnosis present

## 2021-03-24 DIAGNOSIS — Z7982 Long term (current) use of aspirin: Secondary | ICD-10-CM | POA: Diagnosis not present

## 2021-03-24 LAB — CBC
HCT: 39.2 % (ref 39.0–52.0)
Hemoglobin: 13.1 g/dL (ref 13.0–17.0)
MCH: 31.6 pg (ref 26.0–34.0)
MCHC: 33.4 g/dL (ref 30.0–36.0)
MCV: 94.5 fL (ref 80.0–100.0)
Platelets: 115 10*3/uL — ABNORMAL LOW (ref 150–400)
RBC: 4.15 MIL/uL — ABNORMAL LOW (ref 4.22–5.81)
RDW: 13.5 % (ref 11.5–15.5)
WBC: 4.5 10*3/uL (ref 4.0–10.5)
nRBC: 0 % (ref 0.0–0.2)

## 2021-03-24 LAB — BASIC METABOLIC PANEL
Anion gap: 8 (ref 5–15)
BUN: 50 mg/dL — ABNORMAL HIGH (ref 8–23)
CO2: 20 mmol/L — ABNORMAL LOW (ref 22–32)
Calcium: 8 mg/dL — ABNORMAL LOW (ref 8.9–10.3)
Chloride: 101 mmol/L (ref 98–111)
Creatinine, Ser: 2.56 mg/dL — ABNORMAL HIGH (ref 0.61–1.24)
GFR, Estimated: 24 mL/min — ABNORMAL LOW (ref >60)
Glucose, Bld: 202 mg/dL — ABNORMAL HIGH (ref 70–99)
Potassium: 4.1 mmol/L (ref 3.5–5.1)
Sodium: 129 mmol/L — ABNORMAL LOW (ref 135–145)

## 2021-03-24 LAB — PROCALCITONIN: Procalcitonin: 0.36 ng/mL

## 2021-03-24 MED ORDER — IPRATROPIUM-ALBUTEROL 0.5-2.5 (3) MG/3ML IN SOLN
3.0000 mL | RESPIRATORY_TRACT | Status: DC | PRN
  Administered 2021-03-25: 3 mL via RESPIRATORY_TRACT
  Filled 2021-03-24: qty 3

## 2021-03-24 MED ORDER — ASPIRIN EC 81 MG PO TBEC
81.0000 mg | DELAYED_RELEASE_TABLET | Freq: Every day | ORAL | Status: DC
  Administered 2021-03-24 – 2021-03-29 (×6): 81 mg via ORAL
  Filled 2021-03-24 (×6): qty 1

## 2021-03-24 MED ORDER — LEVOTHYROXINE SODIUM 50 MCG PO TABS
125.0000 ug | ORAL_TABLET | Freq: Every day | ORAL | Status: DC
  Administered 2021-03-24 – 2021-03-29 (×6): 125 ug via ORAL
  Filled 2021-03-24 (×6): qty 1

## 2021-03-24 MED ORDER — TIOTROPIUM BROMIDE MONOHYDRATE 18 MCG IN CAPS
18.0000 ug | ORAL_CAPSULE | Freq: Two times a day (BID) | RESPIRATORY_TRACT | Status: DC
  Administered 2021-03-24 – 2021-03-29 (×11): 18 ug via RESPIRATORY_TRACT
  Filled 2021-03-24 (×3): qty 5

## 2021-03-24 MED ORDER — ATORVASTATIN CALCIUM 20 MG PO TABS
20.0000 mg | ORAL_TABLET | Freq: Every day | ORAL | Status: DC
  Administered 2021-03-24 – 2021-03-29 (×6): 20 mg via ORAL
  Filled 2021-03-24 (×6): qty 1

## 2021-03-24 MED ORDER — MOMETASONE FURO-FORMOTEROL FUM 200-5 MCG/ACT IN AERO
2.0000 | INHALATION_SPRAY | Freq: Two times a day (BID) | RESPIRATORY_TRACT | Status: DC
  Administered 2021-03-24 – 2021-03-29 (×11): 2 via RESPIRATORY_TRACT
  Filled 2021-03-24: qty 8.8

## 2021-03-24 MED ORDER — TAMSULOSIN HCL 0.4 MG PO CAPS
0.4000 mg | ORAL_CAPSULE | Freq: Every day | ORAL | Status: DC
  Administered 2021-03-24 – 2021-03-29 (×6): 0.4 mg via ORAL
  Filled 2021-03-24 (×6): qty 1

## 2021-03-24 MED ORDER — METHYLPREDNISOLONE SODIUM SUCC 40 MG IJ SOLR
40.0000 mg | Freq: Two times a day (BID) | INTRAMUSCULAR | Status: AC
  Administered 2021-03-24 (×2): 40 mg via INTRAVENOUS
  Filled 2021-03-24 (×2): qty 1

## 2021-03-24 MED ORDER — ATENOLOL 25 MG PO TABS
25.0000 mg | ORAL_TABLET | Freq: Every day | ORAL | Status: DC
  Administered 2021-03-24 – 2021-03-28 (×5): 25 mg via ORAL
  Filled 2021-03-24 (×6): qty 1
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
## 2021-03-25 DIAGNOSIS — I509 Heart failure, unspecified: Secondary | ICD-10-CM | POA: Diagnosis not present

## 2021-03-25 LAB — BASIC METABOLIC PANEL
Anion gap: 8 (ref 5–15)
BUN: 84 mg/dL — ABNORMAL HIGH (ref 8–23)
CO2: 22 mmol/L (ref 22–32)
Calcium: 8.8 mg/dL — ABNORMAL LOW (ref 8.9–10.3)
Chloride: 104 mmol/L (ref 98–111)
Creatinine, Ser: 2 mg/dL — ABNORMAL HIGH (ref 0.61–1.24)
GFR, Estimated: 33 mL/min — ABNORMAL LOW (ref >60)
Glucose, Bld: 173 mg/dL — ABNORMAL HIGH (ref 70–99)
Potassium: 5.8 mmol/L — ABNORMAL HIGH (ref 3.5–5.1)
Sodium: 134 mmol/L — ABNORMAL LOW (ref 135–145)

## 2021-03-25 LAB — URINE CULTURE

## 2021-03-25 LAB — CBC
HCT: 40.4 % (ref 39.0–52.0)
Hemoglobin: 13.5 g/dL (ref 13.0–17.0)
MCH: 31.8 pg (ref 26.0–34.0)
MCHC: 33.4 g/dL (ref 30.0–36.0)
MCV: 95.3 fL (ref 80.0–100.0)
Platelets: 136 10*3/uL — ABNORMAL LOW (ref 150–400)
RBC: 4.24 MIL/uL (ref 4.22–5.81)
RDW: 13.5 % (ref 11.5–15.5)
WBC: 13 10*3/uL — ABNORMAL HIGH (ref 4.0–10.5)
nRBC: 0 % (ref 0.0–0.2)

## 2021-03-25 LAB — PROCALCITONIN: Procalcitonin: 0.25 ng/mL

## 2021-03-25 MED ORDER — IPRATROPIUM-ALBUTEROL 0.5-2.5 (3) MG/3ML IN SOLN
3.0000 mL | RESPIRATORY_TRACT | Status: AC
  Administered 2021-03-25 – 2021-03-26 (×4): 3 mL via RESPIRATORY_TRACT
  Filled 2021-03-25 (×4): qty 3

## 2021-03-25 MED ORDER — IPRATROPIUM-ALBUTEROL 0.5-2.5 (3) MG/3ML IN SOLN
3.0000 mL | RESPIRATORY_TRACT | Status: DC | PRN
  Administered 2021-03-26: 3 mL via RESPIRATORY_TRACT
  Filled 2021-03-25: qty 3

## 2021-03-25 MED ORDER — SODIUM ZIRCONIUM CYCLOSILICATE 5 G PO PACK
5.0000 g | PACK | Freq: Once | ORAL | Status: AC
  Administered 2021-03-25: 5 g via ORAL
  Filled 2021-03-25: qty 1
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
## 2021-03-26 ENCOUNTER — Inpatient Hospital Stay: Payer: No Typology Code available for payment source

## 2021-03-26 DIAGNOSIS — I509 Heart failure, unspecified: Secondary | ICD-10-CM | POA: Diagnosis not present

## 2021-03-26 LAB — BASIC METABOLIC PANEL
Anion gap: 10 (ref 5–15)
BUN: 131 mg/dL — ABNORMAL HIGH (ref 8–23)
CO2: 23 mmol/L (ref 22–32)
Calcium: 8.4 mg/dL — ABNORMAL LOW (ref 8.9–10.3)
Chloride: 103 mmol/L (ref 98–111)
Creatinine, Ser: 2.15 mg/dL — ABNORMAL HIGH (ref 0.61–1.24)
GFR, Estimated: 30 mL/min — ABNORMAL LOW (ref >60)
Glucose, Bld: 202 mg/dL — ABNORMAL HIGH (ref 70–99)
Potassium: 5.3 mmol/L — ABNORMAL HIGH (ref 3.5–5.1)
Sodium: 136 mmol/L (ref 135–145)

## 2021-03-26 LAB — CBC
HCT: 37.6 % — ABNORMAL LOW (ref 39.0–52.0)
Hemoglobin: 12.4 g/dL — ABNORMAL LOW (ref 13.0–17.0)
MCH: 31.6 pg (ref 26.0–34.0)
MCHC: 33 g/dL (ref 30.0–36.0)
MCV: 95.7 fL (ref 80.0–100.0)
Platelets: 160 10*3/uL (ref 150–400)
RBC: 3.93 MIL/uL — ABNORMAL LOW (ref 4.22–5.81)
RDW: 13.5 % (ref 11.5–15.5)
WBC: 9.6 10*3/uL (ref 4.0–10.5)
nRBC: 0 % (ref 0.0–0.2)

## 2021-03-26 LAB — D-DIMER, QUANTITATIVE: D-Dimer, Quant: 2.11 ug/mL-FEU — ABNORMAL HIGH (ref 0.00–0.50)

## 2021-03-26 NOTE — Progress Notes (Signed)
Oxygen qualification unsuccessful, patient complaint of dizziness and shob. Will reattempt at a later time.

## 2021-03-26 NOTE — Progress Notes (Signed)
Patient declined to walk for O2 qualification, complaint of dizziness and SHOB. RRT called to give PPRN nebs, and educated about PO intake. Pt. Ate lunch.

## 2021-03-26 NOTE — Progress Notes (Signed)
PROGRESS NOTE    Ryan Ali  PFX:[id number] DOB: 08/18/1940 DOA: 03/23/2021 PCP: Birdie Sons, MD   Brief Narrative:  Ryan Ali is a 81 y.o. male with medical history significant for COPD, history of abdominal aortic aneurysm status post vascular repair, hypertension, hypothyroid, hard of hearing, who presents emergency department for chief concerns of shortness of breath and cough worsening over the past week.   Assessment & Plan:  Acute hypoxic respiratory failure, likely multifactorial Heart failure exacerbation Concurrent COPD exacerbation - Continue diuresis as tolerated by creatinine below - Continue nebs/steroids, no indication for antibiotics at this time - Improving minimally with increased scheduled nebs - Given minimally improving symptoms will follow D-dimer and repeat chest x-ray, consider CTA if dimer elevated.  If negative may sideline pulmonology for further insight and recommendations given his minimally improving status  AKI  Concurrent hyperkalemia - Likely secondary to prolonged bactrim use vs cardiorenal/poor perfusion; baseline creat 1.1 - Continue diuretics - improving with increased diuretics consistent with poor renal perfusion due to volume overload -Potassium downtrending appropriately  BPH-Flomax resumed   Hyperlipidemia-atorvastatin 20 mg daily resumed Hypothyroid-levothyroxine 125 mcg p.o. daily resumed Hypertension-continue atenolol 25 mg daily   DVT prophylaxis: Heparin Code Status: Full Family: Wife updated over the phone  Status is: Inpt  Dispo: The patient is from: Home              Anticipated d/c is to: Home              Anticipated d/c date is: 48-72h              Patient currently NOT medically stable for discharge  Consultants:  None  Procedures:  None  Antimicrobials:  None   Subjective: No acute issues/events overnight, respiratory status minimally improving over the past 24 hours, remains severely  dyspneic with even minimal exertion.  Denies chest pain nausea vomiting diarrhea constipation headache fevers chills.  Objective: Vitals:   03/25/21 1910 03/25/21 1958 03/26/21 0457 03/26/21 0505  BP: 112/73  99/63   Pulse: 91  (!) 101 98  Resp: (!) 22  20   Temp: 97.6 F (36.4 C)  97.7 F (36.5 C)   TempSrc: Oral  Oral   SpO2: 93% 93% 93%   Weight:      Height:        Intake/Output Summary (Last 24 hours) at 03/26/2021 0733 Last data filed at 03/26/2021 3299 Gross per 24 hour  Intake --  Output 700 ml  Net -700 ml    Filed Weights   03/23/21 1521  Weight: 91.6 kg    Examination:  General exam: Appears calm and comfortable  Respiratory system: Clear to auscultation. Respiratory effort normal. Cardiovascular system: S1 & S2 heard, RRR. No JVD, murmurs, rubs, gallops or clicks. No pedal edema. Gastrointestinal system: Abdomen is nondistended, soft and nontender. No organomegaly or masses felt. Normal bowel sounds heard. Central nervous system: Alert and oriented. No focal neurological deficits. Extremities: Symmetric 5 x 5 power, 2+ pitting edema BLE Skin: No rashes, lesions or ulcers Psychiatry: Judgement and insight appear normal. Mood & affect appropriate.     Data Reviewed: I have personally reviewed following labs and imaging studies  CBC: Recent Labs  Lab 03/23/21 1527 03/24/21 0500 03/25/21 0525  WBC 5.7 4.5 13.0*  NEUTROABS 4.8  --   --   HGB 14.9 13.1 13.5  HCT 45.2 39.2 40.4  MCV 96.2 94.5 95.3  PLT 119* 115* 136*  Basic Metabolic Panel: Recent Labs  Lab 03/23/21 1527 03/24/21 0500 03/25/21 0525  NA 131* 129* 134*  K 4.5 4.1 5.8*  CL 101 101 104  CO2 23 20* 22  GLUCOSE 120* 202* 173*  BUN 35* 50* 84*  CREATININE 2.16* 2.56* 2.00*  CALCIUM 8.5* 8.0* 8.8*    GFR: Estimated Creatinine Clearance: 31.3 mL/min (A) (by C-G formula based on SCr of 2 mg/dL (H)). Liver Function Tests: Recent Labs  Lab 03/23/21 1527  AST 33  ALT 13   ALKPHOS 87  BILITOT 1.0  PROT 7.3  ALBUMIN 4.0    No results for input(s): LIPASE, AMYLASE in the last 168 hours. No results for input(s): AMMONIA in the last 168 hours. Coagulation Profile: Recent Labs  Lab 03/23/21 1527  INR 1.1    Cardiac Enzymes: No results for input(s): CKTOTAL, CKMB, CKMBINDEX, TROPONINI in the last 168 hours. BNP (last 3 results) No results for input(s): PROBNP in the last 8760 hours. HbA1C: No results for input(s): HGBA1C in the last 72 hours. CBG: No results for input(s): GLUCAP in the last 168 hours. Lipid Profile: No results for input(s): CHOL, HDL, LDLCALC, TRIG, CHOLHDL, LDLDIRECT in the last 72 hours. Thyroid Function Tests: No results for input(s): TSH, T4TOTAL, FREET4, T3FREE, THYROIDAB in the last 72 hours. Anemia Panel: No results for input(s): VITAMINB12, FOLATE, FERRITIN, TIBC, IRON, RETICCTPCT in the last 72 hours. Sepsis Labs: Recent Labs  Lab 03/23/21 1527 03/23/21 1924 03/24/21 0500 03/25/21 0525  PROCALCITON 0.28  --  0.36 0.25  LATICACIDVEN 1.9 1.9  --   --      Recent Results (from the past 240 hour(s))  Blood Culture (routine x 2)     Status: None (Preliminary result)   Collection Time: 03/23/21  3:27 PM   Specimen: BLOOD  Result Value Ref Range Status   Specimen Description BLOOD BLOOD RIGHT FOREARM  Final   Special Requests   Final    BOTTLES DRAWN AEROBIC AND ANAEROBIC Blood Culture results may not be optimal due to an excessive volume of blood received in culture bottles   Culture   Final    NO GROWTH 2 DAYS Performed at [facility], [address] [place], [place] [address]    Report Status PENDING  Incomplete  Resp Panel by RT-PCR (Flu A&B, Covid) Nasopharyngeal Swab     Status: None   Collection Time: 03/23/21  3:27 PM   Specimen: Nasopharyngeal Swab; Nasopharyngeal(NP) swabs in vial transport medium  Result Value Ref Range Status   SARS Coronavirus 2 by RT PCR NEGATIVE NEGATIVE Final     Comment: (NOTE) SARS-CoV-2 target nucleic acids are NOT DETECTED.  The SARS-CoV-2 RNA is generally detectable in upper respiratory specimens during the acute phase of infection. The lowest concentration of SARS-CoV-2 viral copies this assay can detect is 138 copies/mL. A negative result does not preclude SARS-Cov-2 infection and should not be used as the sole basis for treatment or other patient management decisions. A negative result may occur with  improper specimen collection/handling, submission of specimen other than nasopharyngeal swab, presence of viral mutation(s) within the areas targeted by this assay, and inadequate number of viral copies(<138 copies/mL). A negative result must be combined with clinical observations, patient history, and epidemiological information. The expected result is Negative.  Fact Sheet for Patients:  EntrepreneurPulse.com.au  Fact Sheet for Healthcare Providers:  IncredibleEmployment.be  This test is no t yet approved or cleared by the [place] FDA and  has been  authorized for detection and/or diagnosis of SARS-CoV-2 by FDA under an Emergency Use Authorization (EUA). This EUA will remain  in effect (meaning this test can be used) for the duration of the COVID-19 declaration under Section 564(b)(1) of the Act, 21 U.S.C.section 360bbb-3(b)(1), unless the authorization is terminated  or revoked sooner.       Influenza A by PCR NEGATIVE NEGATIVE Final   Influenza B by PCR NEGATIVE NEGATIVE Final    Comment: (NOTE) The Xpert Xpress SARS-CoV-2/FLU/RSV plus assay is intended as an aid in the diagnosis of influenza from Nasopharyngeal swab specimens and should not be used as a sole basis for treatment. Nasal washings and aspirates are unacceptable for Xpert Xpress SARS-CoV-2/FLU/RSV testing.  Fact Sheet for Patients: EntrepreneurPulse.com.au  Fact Sheet for Healthcare  Providers: IncredibleEmployment.be  This test is not yet approved or cleared by the [place] FDA and has been authorized for detection and/or diagnosis of SARS-CoV-2 by FDA under an Emergency Use Authorization (EUA). This EUA will remain in effect (meaning this test can be used) for the duration of the COVID-19 declaration under Section 564(b)(1) of the Act, 21 U.S.C. section 360bbb-3(b)(1), unless the authorization is terminated or revoked.  Performed at [facility], [address] [place], [place] [address]   Urine Culture     Status: Abnormal   Collection Time: 03/23/21  8:09 PM   Specimen: In/Out Cath Urine  Result Value Ref Range Status   Specimen Description   Final    IN/OUT CATH URINE Performed at [facility], [address], [place], [place] [address]    Special Requests   Final    NONE Performed at [facility], [address] [place], [place] [address]    Culture MULTIPLE SPECIES PRESENT, SUGGEST RECOLLECTION (A)  Final   Report Status 03/25/2021 FINAL  Final  Blood Culture (routine x 2)     Status: None (Preliminary result)   Collection Time: 03/24/21 12:55 AM   Specimen: BLOOD  Result Value Ref Range Status   Specimen Description BLOOD LEFT ANTECUBITAL  Final   Special Requests   Final    BOTTLES DRAWN AEROBIC AND ANAEROBIC Blood Culture adequate volume   Culture   Final    NO GROWTH 1 DAY Performed at [facility], [address], [place], [place] [address]    Report Status PENDING  Incomplete       Radiology Studies: No results found.      Scheduled Meds:  aspirin EC  81 mg Oral Daily   atenolol  25 mg Oral Q0600   atorvastatin  20 mg Oral Daily   heparin  5,000 Units Subcutaneous Q8H   ipratropium-albuterol  3 mL Nebulization Q4H   levothyroxine  125 mcg Oral Q0600   mometasone-formoterol  2 puff Inhalation BID   tamsulosin  0.4 mg Oral QPC breakfast   tiotropium  18 mcg  Inhalation BID   Continuous Infusions:   LOS: 2 days    Time spent: 53min    Cherril Hett C Illias Pantano, DO [facility]  If 7PM-7AM, please contact night-coverage [email or web address]  03/26/2021, 7:33 AM

## 2021-03-27 ENCOUNTER — Inpatient Hospital Stay
Admit: 2021-03-27 | Discharge: 2021-03-27 | Disposition: A | Discharge status: Home / Self Care | Payer: No Typology Code available for payment source | Attending: Internal Medicine | Admitting: Internal Medicine

## 2021-03-27 ENCOUNTER — Inpatient Hospital Stay: Admit: 2021-03-27 | Payer: No Typology Code available for payment source

## 2021-03-27 ENCOUNTER — Inpatient Hospital Stay: Payer: No Typology Code available for payment source

## 2021-03-27 DIAGNOSIS — I509 Heart failure, unspecified: Secondary | ICD-10-CM | POA: Diagnosis not present

## 2021-03-27 LAB — BASIC METABOLIC PANEL
Anion gap: 3 — ABNORMAL LOW (ref 5–15)
BUN: 111 mg/dL — ABNORMAL HIGH (ref 8–23)
CO2: 23 mmol/L (ref 22–32)
Calcium: 8 mg/dL — ABNORMAL LOW (ref 8.9–10.3)
Chloride: 111 mmol/L (ref 98–111)
Creatinine, Ser: 1.67 mg/dL — ABNORMAL HIGH (ref 0.61–1.24)
GFR, Estimated: 41 mL/min — ABNORMAL LOW (ref >60)
Glucose, Bld: 140 mg/dL — ABNORMAL HIGH (ref 70–99)
Potassium: 5.2 mmol/L — ABNORMAL HIGH (ref 3.5–5.1)
Sodium: 137 mmol/L (ref 135–145)

## 2021-03-27 LAB — CBC
HCT: 33 % — ABNORMAL LOW (ref 39.0–52.0)
Hemoglobin: 11.3 g/dL — ABNORMAL LOW (ref 13.0–17.0)
MCH: 32.3 pg (ref 26.0–34.0)
MCHC: 34.2 g/dL (ref 30.0–36.0)
MCV: 94.3 fL (ref 80.0–100.0)
Platelets: 152 10*3/uL (ref 150–400)
RBC: 3.5 MIL/uL — ABNORMAL LOW (ref 4.22–5.81)
RDW: 13.5 % (ref 11.5–15.5)
WBC: 8.2 10*3/uL (ref 4.0–10.5)
nRBC: 0 % (ref 0.0–0.2)

## 2021-03-27 MED ORDER — PERFLUTREN LIPID MICROSPHERE
1.0000 mL | INTRAVENOUS | Status: AC | PRN
  Administered 2021-03-27: 2 mL via INTRAVENOUS
  Filled 2021-03-27: qty 10

## 2021-03-27 MED ORDER — PANTOPRAZOLE SODIUM 40 MG PO TBEC
40.0000 mg | DELAYED_RELEASE_TABLET | Freq: Once | ORAL | Status: AC
  Administered 2021-03-27: 40 mg via ORAL
  Filled 2021-03-27: qty 1

## 2021-03-27 MED ORDER — TECHNETIUM TO 99M ALBUMIN AGGREGATED
4.0000 | Freq: Once | INTRAVENOUS | Status: AC | PRN
  Administered 2021-03-27: 4.33 via INTRAVENOUS

## 2021-03-27 NOTE — TOC Initial Note (Addendum)
Transition of Care [facility]) - Initial/Assessment Note    Patient Details  Name: Ryan Ali MRN: [id number] Date of Birth: 05/22/1940  Transition of Care [facility]) CM/SW Contact:    Beverly Sessions, RN Phone Number: 03/27/2021, 1:23 PM  Clinical Narrative:                 Admitted for: COPD Admitted from: home with wife MCN:[id number] or [facility] Current home health/prior home health/DME: cane, bsc,[employer] crutches.  [facility] home [facility] in the past   Patient deferred me to wife to discuss home health.  Wife agreeable to home health RN for COPD.  They would like to se [facility] ([facility] again.  Referral made to [place] with [place].  MD update.  Patient requiring acute O2   Expected Discharge Plan: Home/Self Care Barriers to Discharge: Continued Medical Work up   Patient Goals and CMS Choice        Expected Discharge Plan and Services Expected Discharge Plan: Home/Self Care   Discharge Planning Services: CM Consult   Living arrangements for the past 2 months: Single Family Home                                      Prior Living Arrangements/Services Living arrangements for the past 2 months: Single Family Home Lives with:: Spouse Patient language and need for interpreter reviewed:: Yes Do you feel safe going back to the place where you live?: Yes      Need for Family Participation in Patient Care: Yes (Comment) Care giver support system in place?: Yes (comment) Current home services: DME Criminal Activity/Legal Involvement Pertinent to Current Situation/Hospitalization: No - Comment as needed  Activities of Daily Living Home Assistive Devices/Equipment: None ADL Screening (condition at time of admission) Patient's cognitive ability adequate to safely complete daily activities?: Yes Is the patient deaf or have difficulty hearing?: Yes Does the patient have difficulty seeing, even when wearing glasses/contacts?: No Does the  patient have difficulty concentrating, remembering, or making decisions?: No Patient able to express need for assistance with ADLs?: No Does the patient have difficulty dressing or bathing?: No Independently performs ADLs?: Yes (appropriate for developmental age) Does the patient have difficulty walking or climbing stairs?: Yes Weakness of Legs: None Weakness of Arms/Hands: None  Permission Sought/Granted                  Emotional Assessment       Orientation: : Oriented to Self, Oriented to Place, Oriented to  Time, Oriented to Situation Alcohol / Substance Use: Not Applicable Psych Involvement: No (comment)  Admission diagnosis:  COPD exacerbation ([place]) [J44.1] AKI (acute kidney injury) ([place]) [N17.9] New onset of congestive heart failure ([place]) [I50.9] Patient Active Problem List   Diagnosis Date Noted   COPD exacerbation ([place]) 03/23/2021   AKI (acute kidney injury) ([place]) 03/23/2021   Unintentional weight loss 03/23/2021   Complete rotator cuff tear or rupture of right shoulder, not specified as traumatic 11/30/2020   Encounter for fitting and adjustment of hearing aid 11/30/2020   AAA (abdominal aortic aneurysm) without rupture 11/15/2020   Abnormal ECG 10/17/2020   SOBOE (shortness of breath on exertion) 10/17/2020   Type 2 diabetes mellitus ([place]) 08/10/2020   Tobacco dependence in remission 08/10/2020   Other pulmonary embolism and infarction 08/10/2020   Multiple nodules of lung 08/10/2020   Hypothyroidism 08/10/2020   Hyperlipidemia 08/10/2020  Glaucoma 08/10/2020  ° Fatty (change of) liver, not elsewhere classified 08/10/2020  ° Abdominal aortic aneurysm 08/10/2020  ° Benign essential hypertension 08/10/2020  ° Status post reverse arthroplasty of shoulder, right 06/01/2020  ° Rotator cuff arthropathy, right 02/09/2020  ° Leg cramps 01/07/2020  ° Atherosclerosis of native arteries of extremity with intermittent claudication (HCC) 12/24/2019  ° Thrombocytopenia (HCC)  11/03/2019  ° DDD (degenerative disc disease), cervical 02/26/2018  ° History of adenomatous polyp of colon 01/13/2017  ° Abdominal aneurysm (HCC) 12/16/2014  ° Arthritis 12/16/2014  ° Barrett's esophagus 12/16/2014  ° Prediabetes 12/16/2014  ° Difficulty staying awake 12/16/2014  ° Cervical pain 12/16/2014  ° Fatigue 07/28/2009  ° Personal history of pulmonary embolism 12/27/2007  ° Multiple myeloma in remission (HCC) 03/26/2003  ° COPD (chronic obstructive pulmonary disease) (HCC) 03/25/1998  ° Acid reflux 03/25/1998  ° Hypothyroidism, postop 03/25/1998  ° °PCP:  Fisher, Donald E, MD °Pharmacy:   °[id number] - [place], [place] - [address] °[address] °[place]  [id number] °Phone: [phone] Fax: [phone] ° ° ° ° °Social Determinants of Health (SDOH) Interventions °  ° °Readmission Risk Interventions °Readmission Risk Prevention Plan 03/27/2021  °Transportation Screening Complete  °Social Work Consult for Recovery Care Planning/Counseling Complete  °Palliative Care Screening Not Applicable  °Medication Review (RN Care Manager) Complete  °Some recent data might be hidden  ° ° ° °

## 2021-03-27 NOTE — Progress Notes (Signed)
PROGRESS NOTE    LEDON WEIHE  EHO:[id number] DOB: 1941-01-03 DOA: 03/23/2021 PCP: Birdie Sons, MD   Brief Narrative:  Ryan Ali is a 81 y.o. male with medical history significant for COPD, history of abdominal aortic aneurysm status post vascular repair, hypertension, hypothyroid, hard of hearing, who presents emergency department for chief concerns of shortness of breath and cough worsening over the past week.   Assessment & Plan:  Acute hypoxic respiratory failure, likely multifactorial Heart failure exacerbation(unspecified), POA resolving Concurrent COPD exacerbation, POA ongoing Rule out PE -Holding diuresis given kidney function previously trending upwards -Appears more euvolemic today -Continue nebs/steroids, no indication for antibiotics at this time -Improving minimally with increased scheduled nebs -D-dimer elevated, CTA recommended against given creatinine, VQ scan pending -Echocardiogram ordered to further evaluate patient's symptoms given ongoing dyspnea and hypoxia  AKI, improving Concurrent hyperkalemia -Likely secondary to prolonged bactrim use vs cardiorenal/poor perfusion; baseline creat 1.1 -Diuretics held for 24 hours, will restart tomorrow if creatinine continues to downtrend -Potassium minimally elevated but downtrending appropriately  BPH-Flomax resumed Hyperlipidemia-atorvastatin 20 mg daily resumed Hypothyroid-levothyroxine 125 mcg p.o. daily resumed Hypertension-continue atenolol 25 mg daily   DVT prophylaxis: Heparin Code Status: Full Family: Wife updated over the phone  Status is: Inpt  Dispo: The patient is from: Home              Anticipated d/c is to: Home              Anticipated d/c date is: 48-72h              Patient currently NOT medically stable for discharge  Consultants:  None  Procedures:  None  Antimicrobials:  None   Subjective: No acute issues/events overnight, respiratory status quite controlled  at rest, continues to be quite diminished especially with exertion but improving since admission.  Urine output more appropriate, holding diuretics given above work-up for PE with V/Q as well as echocardiogram today to further evaluate symptoms.  Patient otherwise denies chest pain nausea vomiting diarrhea constipation headache fevers chills.  Objective: Vitals:   03/26/21 1553 03/26/21 2034 03/27/21 0419 03/27/21 0421  BP: 102/61 (!) 95/58  (!) 101/55  Pulse: 85 80 73 73  Resp: 20 20 20 20   Temp: 97.9 F (36.6 C) 98 F (36.7 C) 97.8 F (36.6 C) 98 F (36.7 C)  TempSrc: Oral Oral Oral Oral  SpO2: 96% 98% 98% 97%  Weight:      Height:        Intake/Output Summary (Last 24 hours) at 03/27/2021 0729 Last data filed at 03/27/2021 0150 Gross per 24 hour  Intake --  Output 1000 ml  Net -1000 ml    Filed Weights   03/23/21 1521  Weight: 91.6 kg    Examination:  General:  Pleasantly resting in bed, No acute distress. HEENT:  Normocephalic atraumatic.  Sclerae nonicteric, noninjected.  Extraocular movements intact bilaterally. Neck:  Without mass or deformity.  Trachea is midline. Lungs: Diminished bilaterally without rhonchi, wheeze, or rales. Heart:  Regular rate and rhythm.  Without murmurs, rubs, or gallops. Abdomen:  Soft, nontender, nondistended.  Without guarding or rebound. Extremities: Without cyanosis, clubbing, edema, or obvious deformity. Vascular:  Dorsalis pedis and posterior tibial pulses palpable bilaterally. Skin:  Warm and dry, no erythema, no ulcerations.    Data Reviewed: I have personally reviewed following labs and imaging studies  CBC: Recent Labs  Lab 03/23/21 1527 03/24/21 0500 03/25/21 0525 03/26/21 0637 03/27/21 0421  WBC  5.7 4.5 13.0* 9.6 8.2  NEUTROABS 4.8  --   --   --   --   HGB 14.9 13.1 13.5 12.4* 11.3*  HCT 45.2 39.2 40.4 37.6* 33.0*  MCV 96.2 94.5 95.3 95.7 94.3  PLT 119* 115* 136* 160 681    Basic Metabolic Panel: Recent Labs   Lab 03/23/21 1527 03/24/21 0500 03/25/21 0525 03/26/21 0637 03/27/21 0421  NA 131* 129* 134* 136 137  K 4.5 4.1 5.8* 5.3* 5.2*  CL 101 101 104 103 111  CO2 23 20* 22 23 23   GLUCOSE 120* 202* 173* 202* 140*  BUN 35* 50* 84* 131* 111*  CREATININE 2.16* 2.56* 2.00* 2.15* 1.67*  CALCIUM 8.5* 8.0* 8.8* 8.4* 8.0*    GFR: Estimated Creatinine Clearance: 37.4 mL/min (A) (by C-G formula based on SCr of 1.67 mg/dL (H)). Liver Function Tests: Recent Labs  Lab 03/23/21 1527  AST 33  ALT 13  ALKPHOS 87  BILITOT 1.0  PROT 7.3  ALBUMIN 4.0    No results for input(s): LIPASE, AMYLASE in the last 168 hours. No results for input(s): AMMONIA in the last 168 hours. Coagulation Profile: Recent Labs  Lab 03/23/21 1527  INR 1.1    Cardiac Enzymes: No results for input(s): CKTOTAL, CKMB, CKMBINDEX, TROPONINI in the last 168 hours. BNP (last 3 results) No results for input(s): PROBNP in the last 8760 hours. HbA1C: No results for input(s): HGBA1C in the last 72 hours. CBG: No results for input(s): GLUCAP in the last 168 hours. Lipid Profile: No results for input(s): CHOL, HDL, LDLCALC, TRIG, CHOLHDL, LDLDIRECT in the last 72 hours. Thyroid Function Tests: No results for input(s): TSH, T4TOTAL, FREET4, T3FREE, THYROIDAB in the last 72 hours. Anemia Panel: No results for input(s): VITAMINB12, FOLATE, FERRITIN, TIBC, IRON, RETICCTPCT in the last 72 hours. Sepsis Labs: Recent Labs  Lab 03/23/21 1527 03/23/21 1924 03/24/21 0500 03/25/21 0525  PROCALCITON 0.28  --  0.36 0.25  LATICACIDVEN 1.9 1.9  --   --      Recent Results (from the past 240 hour(s))  Blood Culture (routine x 2)     Status: None (Preliminary result)   Collection Time: 03/23/21  3:27 PM   Specimen: BLOOD  Result Value Ref Range Status   Specimen Description BLOOD BLOOD RIGHT FOREARM  Final   Special Requests   Final    BOTTLES DRAWN AEROBIC AND ANAEROBIC Blood Culture results may not be optimal due to an  excessive volume of blood received in culture bottles   Culture   Final    NO GROWTH 4 DAYS Performed at [facility], [address], [place], [place] [address]    Report Status PENDING  Incomplete  Resp Panel by RT-PCR (Flu A&B, Covid) Nasopharyngeal Swab     Status: None   Collection Time: 03/23/21  3:27 PM   Specimen: Nasopharyngeal Swab; Nasopharyngeal(NP) swabs in vial transport medium  Result Value Ref Range Status   SARS Coronavirus 2 by RT PCR NEGATIVE NEGATIVE Final    Comment: (NOTE) SARS-CoV-2 target nucleic acids are NOT DETECTED.  The SARS-CoV-2 RNA is generally detectable in upper respiratory specimens during the acute phase of infection. The lowest concentration of SARS-CoV-2 viral copies this assay can detect is 138 copies/mL. A negative result does not preclude SARS-Cov-2 infection and should not be used as the sole basis for treatment or other patient management decisions. A negative result may occur with  improper specimen collection/handling, submission of specimen other than nasopharyngeal swab,  presence of viral mutation(s) within the areas targeted by this assay, and inadequate number of viral copies(<138 copies/mL). A negative result must be combined with clinical observations, patient history, and epidemiological information. The expected result is Negative.  Fact Sheet for Patients:  EntrepreneurPulse.com.au  Fact Sheet for Healthcare Providers:  IncredibleEmployment.be  This test is no t yet approved or cleared by the [place] FDA and  has been authorized for detection and/or diagnosis of SARS-CoV-2 by FDA under an Emergency Use Authorization (EUA). This EUA will remain  in effect (meaning this test can be used) for the duration of the COVID-19 declaration under Section 564(b)(1) of the Act, 21 U.S.C.section 360bbb-3(b)(1), unless the authorization is terminated  or revoked sooner.       Influenza  A by PCR NEGATIVE NEGATIVE Final   Influenza B by PCR NEGATIVE NEGATIVE Final    Comment: (NOTE) The Xpert Xpress SARS-CoV-2/FLU/RSV plus assay is intended as an aid in the diagnosis of influenza from Nasopharyngeal swab specimens and should not be used as a sole basis for treatment. Nasal washings and aspirates are unacceptable for Xpert Xpress SARS-CoV-2/FLU/RSV testing.  Fact Sheet for Patients: EntrepreneurPulse.com.au  Fact Sheet for Healthcare Providers: IncredibleEmployment.be  This test is not yet approved or cleared by the [place] FDA and has been authorized for detection and/or diagnosis of SARS-CoV-2 by FDA under an Emergency Use Authorization (EUA). This EUA will remain in effect (meaning this test can be used) for the duration of the COVID-19 declaration under Section 564(b)(1) of the Act, 21 U.S.C. section 360bbb-3(b)(1), unless the authorization is terminated or revoked.  Performed at [facility], [address], [place], [place] [address]   Urine Culture     Status: Abnormal   Collection Time: 03/23/21  8:09 PM   Specimen: In/Out Cath Urine  Result Value Ref Range Status   Specimen Description   Final    IN/OUT CATH URINE Performed at [facility], [address] [place], [place] [address]    Special Requests   Final    NONE Performed at [facility], [address] [place], [place] [address]    Culture MULTIPLE SPECIES PRESENT, SUGGEST RECOLLECTION (A)  Final   Report Status 03/25/2021 FINAL  Final  Blood Culture (routine x 2)     Status: None (Preliminary result)   Collection Time: 03/24/21 12:55 AM   Specimen: BLOOD  Result Value Ref Range Status   Specimen Description BLOOD LEFT ANTECUBITAL  Final   Special Requests   Final    BOTTLES DRAWN AEROBIC AND ANAEROBIC Blood Culture adequate volume   Culture   Final    NO GROWTH 3 DAYS Performed at [facility], [address] [place], [place] [address]    Report Status PENDING  Incomplete       Radiology Studies: DG Chest 1 View  Result Date: 03/26/2021 CLINICAL DATA:  Hypoxia. EXAM: CHEST  1 VIEW COMPARISON:  03/23/2021 and older exams. FINDINGS: Cardiac silhouette is normal in size. No mediastinal or hilar masses. Prominent interstitial markings in the mid to lower lungs. Relative lucency in the upper lobes, particularly on the right, consistent with emphysema. No evidence of pneumonia or pulmonary edema. No pleural effusion or pneumothorax. Reverse right shoulder prosthesis, incompletely imaged, well aligned. IMPRESSION: 1. No acute cardiopulmonary disease. 2. Emphysema. Electronically Signed   By: Lajean Manes M.D.   On: 03/26/2021 16:35        Scheduled Meds:  aspirin EC  81 mg Oral Daily   atenolol  25 mg Oral Q0600   atorvastatin  20 mg Oral Daily   heparin  5,000 Units Subcutaneous Q8H   levothyroxine  125 mcg Oral Q0600   mometasone-formoterol  2 puff Inhalation BID   tamsulosin  0.4 mg Oral QPC breakfast   tiotropium  18 mcg Inhalation BID   Continuous Infusions:   LOS: 3 days    Time spent: 49min    Aldair Rickel C Anthonella Klausner, DO [facility]  If 7PM-7AM, please contact night-coverage [email or web address]  03/27/2021, 7:29 AM

## 2021-03-28 DIAGNOSIS — J441 Chronic obstructive pulmonary disease with (acute) exacerbation: Secondary | ICD-10-CM | POA: Diagnosis not present

## 2021-03-28 LAB — CBC
HCT: 30.5 % — ABNORMAL LOW (ref 39.0–52.0)
Hemoglobin: 10.3 g/dL — ABNORMAL LOW (ref 13.0–17.0)
MCH: 32.1 pg (ref 26.0–34.0)
MCHC: 33.8 g/dL (ref 30.0–36.0)
MCV: 95 fL (ref 80.0–100.0)
Platelets: 160 10*3/uL (ref 150–400)
RBC: 3.21 MIL/uL — ABNORMAL LOW (ref 4.22–5.81)
RDW: 13.3 % (ref 11.5–15.5)
WBC: 8.2 10*3/uL (ref 4.0–10.5)
nRBC: 0 % (ref 0.0–0.2)

## 2021-03-28 LAB — BASIC METABOLIC PANEL
Anion gap: 4 — ABNORMAL LOW (ref 5–15)
BUN: 80 mg/dL — ABNORMAL HIGH (ref 8–23)
CO2: 25 mmol/L (ref 22–32)
Calcium: 8.2 mg/dL — ABNORMAL LOW (ref 8.9–10.3)
Chloride: 108 mmol/L (ref 98–111)
Creatinine, Ser: 1.57 mg/dL — ABNORMAL HIGH (ref 0.61–1.24)
GFR, Estimated: 44 mL/min — ABNORMAL LOW (ref >60)
Glucose, Bld: 119 mg/dL — ABNORMAL HIGH (ref 70–99)
Potassium: 4.9 mmol/L (ref 3.5–5.1)
Sodium: 137 mmol/L (ref 135–145)

## 2021-03-28 LAB — CULTURE, BLOOD (ROUTINE X 2): Culture: NO GROWTH

## 2021-03-28 LAB — ECHOCARDIOGRAM COMPLETE
Area-P 1/2: 3.17 cm2
Height: 67 in
S' Lateral: 2.7 cm
Weight: 3232 oz

## 2021-03-28 MED ORDER — PREDNISONE 20 MG PO TABS
40.0000 mg | ORAL_TABLET | Freq: Every day | ORAL | Status: DC
  Administered 2021-03-28 – 2021-03-29 (×2): 40 mg via ORAL
  Filled 2021-03-28 (×2): qty 2

## 2021-03-28 MED ORDER — ENOXAPARIN SODIUM 40 MG/0.4ML IJ SOSY
40.0000 mg | PREFILLED_SYRINGE | INTRAMUSCULAR | Status: DC
  Administered 2021-03-29: 40 mg via SUBCUTANEOUS
  Filled 2021-03-28: qty 0.4

## 2021-03-28 NOTE — Progress Notes (Signed)
PROGRESS NOTE    Ryan Ali  YDX:[id number] DOB: 06-Jul-1940 DOA: 03/23/2021 PCP: Birdie Sons, MD  210A/210A-AA   Assessment & Plan:   Active Problems:   COPD (chronic obstructive pulmonary disease) (HCC)   Prediabetes   Acid reflux   Personal history of pulmonary embolism   Hypothyroidism, postop   Benign essential hypertension   COPD exacerbation (HCC)   AKI (acute kidney injury) ([place])   Unintentional weight loss   Ryan Ali is a 81 y.o. male with medical history significant for COPD, history of abdominal aortic aneurysm status post vascular repair, hypertension, hypothyroid, hard of hearing, who presents emergency department for chief concerns of shortness of breath and cough worsening over the past week.   Acute hypoxic respiratory failure, COPD exacerbation, POA  --hypoxia not documented, was put on 2L [place] on presentation. --started on IV solumedrol on admission, however, steroid was not continued after day 2. Plan: --resume steroid prednisone 40 mg daily today for treatment of COPD exacerbation --cont nebs --walk test to see if pt qualifies for supplemental oxygen  Heart failure exacerbation, ruled out --hold diuretic  AKI, improving --Cr 2.16 on presentation.  Worsened with IV lasix, so likely volume depleted. --Cr improved with holding diuretic.  Hyperkalemia, resolved --resolved with Lokelma  BPH-cont Flomxa Hyperlipidemia-atorvastatin 20 mg daily resumed Hypothyroid-levothyroxine 125 mcg p.o. daily resumed Hypertension -hold home atenolol due to soft BP   DVT prophylaxis: Lovenox SQ Code Status: Full code  Family Communication:  Level of care: Telemetry Medical Dispo:   The patient is from: home Anticipated d/c is to: home Anticipated d/c date is: tomorrow   Subjective and Interval History:  Pt reported breathing improved.  Pt wanted to be discharged on supplemental oxygen, however, he had been refusing to do the walk  test.   Objective: Vitals:   03/27/21 2054 03/28/21 0309 03/28/21 0746 03/28/21 1613  BP: (!) 104/58 (!) 90/59 92/63 95/61   Pulse: 79 72 73 65  Resp: 18 15 17 19   Temp: 97.7 F (36.5 C) 98.4 F (36.9 C) 98.2 F (36.8 C) 98.5 F (36.9 C)  TempSrc: Oral  Oral Oral  SpO2: 93% 94% 94% 94%  Weight:      Height:        Intake/Output Summary (Last 24 hours) at 03/28/2021 2005 Last data filed at 03/28/2021 1853 Gross per 24 hour  Intake 600 ml  Output 200 ml  Net 400 ml   Filed Weights   03/23/21 1521  Weight: 91.6 kg    Examination:   Constitutional: NAD, AAOx3 HEENT: conjunctivae and lids normal, EOMI CV: No cyanosis.   RESP: reduced air movement, mild wheeze, on 2L Extremities: No effusions, edema in BLE SKIN: warm, dry Neuro: II - XII grossly intact.   Psych: grouchy mood and affect.     Data Reviewed: I have personally reviewed following labs and imaging studies  CBC: Recent Labs  Lab 03/23/21 1527 03/24/21 0500 03/25/21 0525 03/26/21 0637 03/27/21 0421 03/28/21 0338  WBC 5.7 4.5 13.0* 9.6 8.2 8.2  NEUTROABS 4.8  --   --   --   --   --   HGB 14.9 13.1 13.5 12.4* 11.3* 10.3*  HCT 45.2 39.2 40.4 37.6* 33.0* 30.5*  MCV 96.2 94.5 95.3 95.7 94.3 95.0  PLT 119* 115* 136* 160 152 720   Basic Metabolic Panel: Recent Labs  Lab 03/24/21 0500 03/25/21 0525 03/26/21 0637 03/27/21 0421 03/28/21 0338  NA 129* 134* 136 137 137  K  4.1 5.8* 5.3* 5.2* 4.9  CL 101 104 103 111 108  CO2 20* 22 23 23 25   GLUCOSE 202* 173* 202* 140* 119*  BUN 50* 84* 131* 111* 80*  CREATININE 2.56* 2.00* 2.15* 1.67* 1.57*  CALCIUM 8.0* 8.8* 8.4* 8.0* 8.2*   GFR: Estimated Creatinine Clearance: 39.8 mL/min (A) (by C-G formula based on SCr of 1.57 mg/dL (H)). Liver Function Tests: Recent Labs  Lab 03/23/21 1527  AST 33  ALT 13  ALKPHOS 87  BILITOT 1.0  PROT 7.3  ALBUMIN 4.0   No results for input(s): LIPASE, AMYLASE in the last 168 hours. No results for input(s): AMMONIA  in the last 168 hours. Coagulation Profile: Recent Labs  Lab 03/23/21 1527  INR 1.1   Cardiac Enzymes: No results for input(s): CKTOTAL, CKMB, CKMBINDEX, TROPONINI in the last 168 hours. BNP (last 3 results) No results for input(s): PROBNP in the last 8760 hours. HbA1C: No results for input(s): HGBA1C in the last 72 hours. CBG: No results for input(s): GLUCAP in the last 168 hours. Lipid Profile: No results for input(s): CHOL, HDL, LDLCALC, TRIG, CHOLHDL, LDLDIRECT in the last 72 hours. Thyroid Function Tests: No results for input(s): TSH, T4TOTAL, FREET4, T3FREE, THYROIDAB in the last 72 hours. Anemia Panel: No results for input(s): VITAMINB12, FOLATE, FERRITIN, TIBC, IRON, RETICCTPCT in the last 72 hours. Sepsis Labs: Recent Labs  Lab 03/23/21 1527 03/23/21 1924 03/24/21 0500 03/25/21 0525  PROCALCITON 0.28  --  0.36 0.25  LATICACIDVEN 1.9 1.9  --   --     Recent Results (from the past 240 hour(s))  Blood Culture (routine x 2)     Status: None   Collection Time: 03/23/21  3:27 PM   Specimen: BLOOD  Result Value Ref Range Status   Specimen Description BLOOD BLOOD RIGHT FOREARM  Final   Special Requests   Final    BOTTLES DRAWN AEROBIC AND ANAEROBIC Blood Culture results may not be optimal due to an excessive volume of blood received in culture bottles   Culture   Final    NO GROWTH 5 DAYS Performed at [facility], [address] [place], [place] [address]    Report Status 03/28/2021 FINAL  Final  Resp Panel by RT-PCR (Flu A&B, Covid) Nasopharyngeal Swab     Status: None   Collection Time: 03/23/21  3:27 PM   Specimen: Nasopharyngeal Swab; Nasopharyngeal(NP) swabs in vial transport medium  Result Value Ref Range Status   SARS Coronavirus 2 by RT PCR NEGATIVE NEGATIVE Final    Comment: (NOTE) SARS-CoV-2 target nucleic acids are NOT DETECTED.  The SARS-CoV-2 RNA is generally detectable in upper respiratory specimens during the acute phase of infection.  The lowest concentration of SARS-CoV-2 viral copies this assay can detect is 138 copies/mL. A negative result does not preclude SARS-Cov-2 infection and should not be used as the sole basis for treatment or other patient management decisions. A negative result may occur with  improper specimen collection/handling, submission of specimen other than nasopharyngeal swab, presence of viral mutation(s) within the areas targeted by this assay, and inadequate number of viral copies(<138 copies/mL). A negative result must be combined with clinical observations, patient history, and epidemiological information. The expected result is Negative.  Fact Sheet for Patients:  EntrepreneurPulse.com.au  Fact Sheet for Healthcare Providers:  IncredibleEmployment.be  This test is no t yet approved or cleared by the [place] FDA and  has been authorized for detection and/or diagnosis of SARS-CoV-2 by FDA under an  Emergency Use Authorization (EUA). This EUA will remain  in effect (meaning this test can be used) for the duration of the COVID-19 declaration under Section 564(b)(1) of the Act, 21 U.S.C.section 360bbb-3(b)(1), unless the authorization is terminated  or revoked sooner.       Influenza A by PCR NEGATIVE NEGATIVE Final   Influenza B by PCR NEGATIVE NEGATIVE Final    Comment: (NOTE) The Xpert Xpress SARS-CoV-2/FLU/RSV plus assay is intended as an aid in the diagnosis of influenza from Nasopharyngeal swab specimens and should not be used as a sole basis for treatment. Nasal washings and aspirates are unacceptable for Xpert Xpress SARS-CoV-2/FLU/RSV testing.  Fact Sheet for Patients: EntrepreneurPulse.com.au  Fact Sheet for Healthcare Providers: IncredibleEmployment.be  This test is not yet approved or cleared by the [place] FDA and has been authorized for detection and/or diagnosis of SARS-CoV-2 by FDA  under an Emergency Use Authorization (EUA). This EUA will remain in effect (meaning this test can be used) for the duration of the COVID-19 declaration under Section 564(b)(1) of the Act, 21 U.S.C. section 360bbb-3(b)(1), unless the authorization is terminated or revoked.  Performed at [facility], [address], [place], [place] [address]   Urine Culture     Status: Abnormal   Collection Time: 03/23/21  8:09 PM   Specimen: In/Out Cath Urine  Result Value Ref Range Status   Specimen Description   Final    IN/OUT CATH URINE Performed at [facility], [address] [place], [place] [address]    Special Requests   Final    NONE Performed at [facility], [address] [place], [place] [address]    Culture MULTIPLE SPECIES PRESENT, SUGGEST RECOLLECTION (A)  Final   Report Status 03/25/2021 FINAL  Final  Blood Culture (routine x 2)     Status: None (Preliminary result)   Collection Time: 03/24/21 12:55 AM   Specimen: BLOOD  Result Value Ref Range Status   Specimen Description BLOOD LEFT ANTECUBITAL  Final   Special Requests   Final    BOTTLES DRAWN AEROBIC AND ANAEROBIC Blood Culture adequate volume   Culture   Final    NO GROWTH 4 DAYS Performed at [facility], [address], [place], [place] [address]    Report Status PENDING  Incomplete  Urine Culture     Status: None (Preliminary result)   Collection Time: 03/26/21 10:30 PM   Specimen: Urine, Random  Result Value Ref Range Status   Specimen Description   Final    URINE, RANDOM Performed at [facility], [address], [place], [place] [address]    Special Requests   Final    NONE Performed at [facility], [address], [place], [place] [address]    Culture   Final    CULTURE REINCUBATED FOR BETTER GROWTH Performed at [facility], [place] [address] [place],  [address]    Report Status PENDING  Incomplete      Radiology Studies: NM  Pulmonary Perfusion  Result Date: 03/27/2021 CLINICAL DATA:  Pulmonary embolism suspected.  Positive D-dimer. EXAM: NUCLEAR MEDICINE PERFUSION LUNG SCAN TECHNIQUE: Perfusion images were obtained in multiple projections after intravenous injection of radiopharmaceutical. Ventilation scans intentionally deferred if perfusion scan and chest x-ray adequate for interpretation during COVID 19 epidemic. RADIOPHARMACEUTICALS:  4.33 mCi Tc-27m MAA IV COMPARISON:  Chest radiography yesterday.  Chest CT 03/23/2021 FINDINGS: Slightly patchy appearance bilaterally consistent with patient's known centrilobular emphysema. Relatively diminished perfusion  within the right upper lobe apex. Again, this may be due to more advanced emphysema in this region, but the possibility of pulmonary emboli in this area is not excluded. Consider CT angiography a if appropriate. IMPRESSION: Heterogeneous perfusion pattern consistent with emphysema. Focal diminished perfusion in the apical segment of the right upper lobe. This may be due to more advanced emphysema in this region, in correlation with the CT scan. However, I cannot rule out the possibility of a right upper lobe embolus at this point. Electronically Signed   By: Nelson Chimes M.D.   On: 03/27/2021 14:11   ECHOCARDIOGRAM COMPLETE  Result Date: 03/28/2021    ECHOCARDIOGRAM REPORT   Patient Name:   Ryan Ali [facility] Date of Exam: 03/27/2021 Medical Rec #:  [id number]           Height:       67.0 in Accession #:    [id number]          Weight:       202.0 lb Date of Birth:  1941/02/09           BSA:          2.031 m Patient Age:    31 years            BP:           104/58 mmHg Patient Gender: M                   HR:           70 bpm. Exam Location:  [facility] Procedure: 2D Echo, Cardiac Doppler, Color Doppler and Intracardiac            Opacification Agent Indications:     R06.00 Dyspnea  History:         Patient has no prior history of Echocardiogram examinations.                   Arrythmias:RBBB; Risk Factors:Hypertension and Diabetes.                  Pulmonary embolism. AAA. COPD. Dyspnea. Hypothyroidism.  Sonographer:     Cresenciano Lick RDCS Referring Phys:  [id number] [place] Ishikawa Diagnosing Phys: Isaias Cowman MD IMPRESSIONS  1. Left ventricular ejection fraction, by estimation, is 60 to 65%. The left ventricle has normal function. The left ventricle has no regional wall motion abnormalities. Left ventricular diastolic parameters are consistent with Grade I diastolic dysfunction (impaired relaxation).  2. Right ventricular systolic function is normal. The right ventricular size is normal.  3. The mitral valve is normal in structure. Mild mitral valve regurgitation. No evidence of mitral stenosis.  4. The aortic valve is normal in structure. Aortic valve regurgitation is not visualized. No aortic stenosis is present.  5. The inferior vena cava is normal in size with greater than 50% respiratory variability, suggesting right atrial pressure of 3 mmHg. FINDINGS  Left Ventricle: Left ventricular ejection fraction, by estimation, is 60 to 65%. The left ventricle has normal function. The left ventricle has no regional wall motion abnormalities. Definity contrast agent was given IV to delineate the left ventricular  endocardial borders. The left ventricular internal cavity size was normal in size. There is no left ventricular hypertrophy. Left ventricular diastolic parameters are consistent with Grade I diastolic dysfunction (impaired relaxation). Right Ventricle: The right ventricular size is normal. No increase in right ventricular wall thickness. Right ventricular systolic function is normal. Left Atrium: Left atrial size  was normal in size. Right Atrium: Right atrial size was normal in size. Pericardium: There is no evidence of pericardial effusion. Mitral Valve: The mitral valve is normal in structure. Mild mitral valve regurgitation. No evidence of mitral valve  stenosis. Tricuspid Valve: The tricuspid valve is normal in structure. Tricuspid valve regurgitation is mild . No evidence of tricuspid stenosis. Aortic Valve: The aortic valve is normal in structure. Aortic valve regurgitation is not visualized. No aortic stenosis is present. Pulmonic Valve: The pulmonic valve was normal in structure. Pulmonic valve regurgitation is not visualized. No evidence of pulmonic stenosis. Aorta: The aortic root is normal in size and structure. Venous: The inferior vena cava is normal in size with greater than 50% respiratory variability, suggesting right atrial pressure of 3 mmHg. IAS/Shunts: No atrial level shunt detected by color flow Doppler.  LEFT VENTRICLE PLAX 2D LVIDd:         4.70 cm   Diastology LVIDs:         2.70 cm   LV e' medial:    5.77 cm/s LV PW:         0.90 cm   LV E/e' medial:  10.1 LV IVS:        0.90 cm   LV e' lateral:   6.53 cm/s LVOT diam:     2.10 cm   LV E/e' lateral: 9.0 LV SV:         75 LV SV Index:   37 LVOT Area:     3.46 cm  RIGHT VENTRICLE RV Basal diam:  4.70 cm RV S prime:     11.20 cm/s TAPSE (M-mode): 1.9 cm LEFT ATRIUM             Index        RIGHT ATRIUM           Index LA diam:        4.50 cm 2.22 cm/m   RA Area:     15.60 cm LA Vol (A2C):   40.2 ml 19.79 ml/m  RA Volume:   46.10 ml  22.70 ml/m LA Vol (A4C):   47.5 ml 23.39 ml/m LA Biplane Vol: 44.8 ml 22.06 ml/m  AORTIC VALVE LVOT Vmax:   103.45 cm/s LVOT Vmean:  75.150 cm/s LVOT VTI:    0.217 m  AORTA Ao Root diam: 3.70 cm MITRAL VALVE MV Area (PHT): 3.17 cm    SHUNTS MV Decel Time: 239 msec    Systemic VTI:  0.22 m MV E velocity: 58.50 cm/s  Systemic Diam: 2.10 cm MV A velocity: 92.40 cm/s MV E/A ratio:  0.63 Isaias Cowman MD Electronically signed by Isaias Cowman MD Signature Date/Time: 03/28/2021/1:14:44 PM    Final      Scheduled Meds:  aspirin EC  81 mg Oral Daily   atorvastatin  20 mg Oral Daily   heparin  5,000 Units Subcutaneous Q8H   levothyroxine  125 mcg Oral  Q0600   mometasone-formoterol  2 puff Inhalation BID   predniSONE  40 mg Oral Q breakfast   tamsulosin  0.4 mg Oral QPC breakfast   tiotropium  18 mcg Inhalation BID   Continuous Infusions:   LOS: 4 days     Enzo Bi, MD [facility] If 7PM-7AM, please contact night-coverage 03/28/2021, 8:05 PM

## 2021-03-29 DIAGNOSIS — J441 Chronic obstructive pulmonary disease with (acute) exacerbation: Secondary | ICD-10-CM | POA: Diagnosis not present

## 2021-03-29 LAB — URINE CULTURE: Culture: 50000 — AB

## 2021-03-29 LAB — CBC
HCT: 29.3 % — ABNORMAL LOW (ref 39.0–52.0)
Hemoglobin: 10 g/dL — ABNORMAL LOW (ref 13.0–17.0)
MCH: 32.3 pg (ref 26.0–34.0)
MCHC: 34.1 g/dL (ref 30.0–36.0)
MCV: 94.5 fL (ref 80.0–100.0)
Platelets: 187 10*3/uL (ref 150–400)
RBC: 3.1 MIL/uL — ABNORMAL LOW (ref 4.22–5.81)
RDW: 13.3 % (ref 11.5–15.5)
WBC: 8.1 10*3/uL (ref 4.0–10.5)
nRBC: 0.2 % (ref 0.0–0.2)

## 2021-03-29 LAB — BASIC METABOLIC PANEL
Anion gap: 4 — ABNORMAL LOW (ref 5–15)
BUN: 56 mg/dL — ABNORMAL HIGH (ref 8–23)
CO2: 25 mmol/L (ref 22–32)
Calcium: 7.9 mg/dL — ABNORMAL LOW (ref 8.9–10.3)
Chloride: 109 mmol/L (ref 98–111)
Creatinine, Ser: 1.29 mg/dL — ABNORMAL HIGH (ref 0.61–1.24)
GFR, Estimated: 56 mL/min — ABNORMAL LOW (ref >60)
Glucose, Bld: 130 mg/dL — ABNORMAL HIGH (ref 70–99)
Potassium: 5 mmol/L (ref 3.5–5.1)
Sodium: 138 mmol/L (ref 135–145)

## 2021-03-29 LAB — CULTURE, BLOOD (ROUTINE X 2)
Culture: NO GROWTH
Special Requests: ADEQUATE

## 2021-03-29 LAB — MAGNESIUM: Magnesium: 2.4 mg/dL (ref 1.7–2.4)

## 2021-03-29 MED ORDER — ATENOLOL 25 MG PO TABS: ORAL_TABLET | ORAL | 4 refills | Status: DC

## 2021-03-29 MED ORDER — PREDNISONE 20 MG PO TABS: 40.0000 mg | ORAL_TABLET | Freq: Every day | ORAL | 0 refills | Status: AC

## 2021-03-29 MED ORDER — PREDNISONE 20 MG PO TABS: 40.0000 mg | ORAL_TABLET | Freq: Every day | ORAL | 0 refills | Status: DC

## 2021-03-29 NOTE — Progress Notes (Signed)
Pt discharged per MD order. IV removed. Discharge instructions explained to pt and his wife. Wife had follow-up questions for Dr. Billie Ruddy. Dr. Billie Ruddy spoke with pt and wife and answered her questions. AVS reprinted to reflect changes and given to pt. Pt connected to home O2 and taken to car in wheelchair via staff.
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
## 2021-03-29 NOTE — Progress Notes (Signed)
SATURATION QUALIFICATIONS: (This note is used to comply with regulatory documentation for home oxygen)  Patient Saturations on Room Air at Rest = 94%  Patient Saturations on Room Air while Ambulating = 87%  Patient Saturations on 2 Liters of oxygen while Ambulating = 94%

## 2021-03-29 NOTE — Progress Notes (Signed)
Mobility Specialist - Progress Note   03/29/21 1000  Mobility  Activity Ambulated in hall  Level of Assistance Standby assist, set-up cues, supervision of patient - no hands on  Assistive Device Front wheel walker  Distance Ambulated (ft) 60 ft  Mobility Ambulated with assistance in hallway  Mobility Response Tolerated well  Mobility performed by Mobility specialist  $Mobility charge 1 Mobility     Pre-mobility: 94% SpO2 During mobility: 87-94% SpO2   Pt ambulated in hallway with supervision. Mild LOB x1 with self-correction. O2 desat to 87% on RA, bumped to 2L with sats improving to mid 90s. Pt left in bed with RN present.    Kathee Delton Mobility Specialist 03/29/21, 10:48 AM

## 2021-03-29 NOTE — TOC Transition Note (Signed)
Transition of Care [facility]) - CM/SW Discharge Note   Patient Details  Name: Ryan Ali MRN: [id number] Date of Birth: 10/08/1940  [facility] CM/SW Contact:  Beverly Sessions, RN Phone Number: 03/29/2021, 12:45 PM   Clinical Narrative:    Patient to discharge today [place] with [place] notified Patient with qualifying O2 sats for home O2.  Discussed with patient and wife.  They have decided to use their medicare benefits.  Referral made to [facility] with [facility].  Will be delivered to room prior to discharge       Barriers to Discharge: Continued Medical Work up   Patient Goals and CMS Choice        Discharge Placement                       Discharge Plan and Services   Discharge Planning Services: CM Consult                                 Social Determinants of Health (SDOH) Interventions     Readmission Risk Interventions Readmission Risk Prevention Plan 03/27/2021  Transportation Screening Complete  Social Work Consult for [place] Planning/Counseling [place] Not Applicable  Medication Review Press photographer) Complete  Some recent data might be hidden

## 2021-03-30 ENCOUNTER — Telehealth: Payer: Self-pay

## 2021-03-30 DIAGNOSIS — R2681 Unsteadiness on feet: Secondary | ICD-10-CM | POA: Diagnosis not present

## 2021-03-30 NOTE — Telephone Encounter (Signed)
Transition Care Management Follow-up Telephone Call Date of discharge and from where: 03/29/21 [facility] How have you been since you were released from the hospital? Pt's wife Ryan Ali states he is doing okay; still sleeping at time of call Any questions or concerns? Yes - pt's plavix d/c'd by vascular provider on 03/02/21 but restarted while in hospital due heparin administered for DVT prophylaxis; pt's wife wants to know if he will need to continue taking plavix Items Reviewed: Did the pt receive and understand the discharge instructions provided? Yes  Medications obtained and verified? Yes  Other? No  Any new allergies since your discharge? No  Dietary orders reviewed? Yes Do you have support at home? Yes   Home Care and Equipment/Supplies: Were home health services ordered? yes If so, what is the name of the agency? [facility]  Has the agency set up a time to come to the patient's home? no Were any new equipment or medical supplies ordered?  Yes: oxygen What is the name of the medical supply agency? [employer] Were you able to get the supplies/equipment? yes Do you have any questions related to the use of the equipment or supplies? Yes: per wife the portable tanks are not working correctly and [employer] is scheduled to come out to their home today to assess  Functional Questionnaire: (I = Independent and D = Dependent) ADLs: I  Bathing/Dressing- I  Meal Prep- I  Eating- I  Maintaining continence- I  Transferring/Ambulation- I  Managing Meds- I with assistance  Follow up appointments reviewed:  PCP Hospital f/u appt confirmed? Yes  Scheduled to see Dr. Caryn Section on 04/09/21 @ 11:00. [place] [facility] f/u appt confirmed? No  (not indicated) Are transportation arrangements needed? No  If their condition worsens, is the pt aware to call PCP or go to the Emergency Dept.? Yes Was the patient provided with contact information for the PCP's office or ED? Yes Was to pt encouraged to call back with  questions or concerns? Yes

## 2021-03-30 NOTE — Telephone Encounter (Signed)
Correction: patient scheduled with Dr. Ky Barban on 04/09/21 at 11:00

## 2021-04-02 ENCOUNTER — Ambulatory Visit: Payer: PPO | Admitting: Family Medicine

## 2021-04-03 ENCOUNTER — Ambulatory Visit: Payer: PPO | Admitting: Family Medicine

## 2021-04-04 IMAGING — CR DG RIBS 2V*L*
1 series · 5 of 5 positions shown · non-contrast
Comparison: 07/01/2011

CLINICAL DATA: Fall several days ago with left-sided chest pain,
initial encounter

EXAM:
LEFT RIBS - 2 VIEW

[Series 1: dg ribs unilateral left · 0.14mm/px · 5 of 5 slices shown]
[im 1/5]
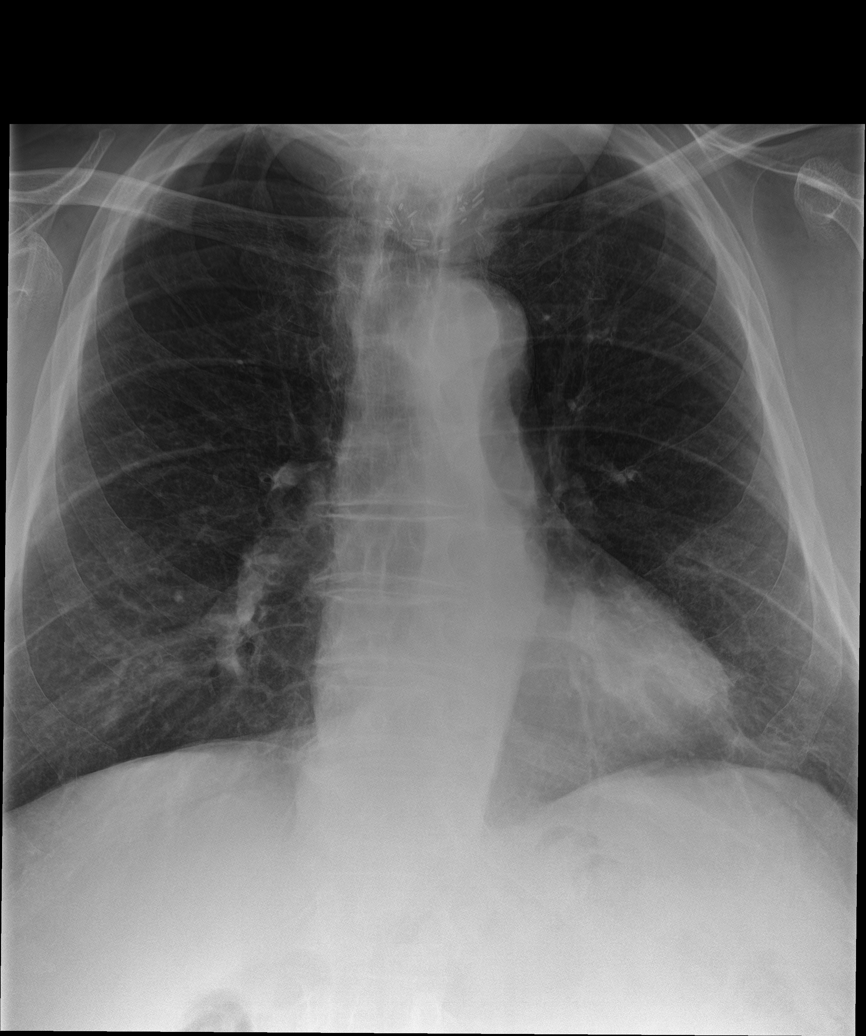
[im 2/5]
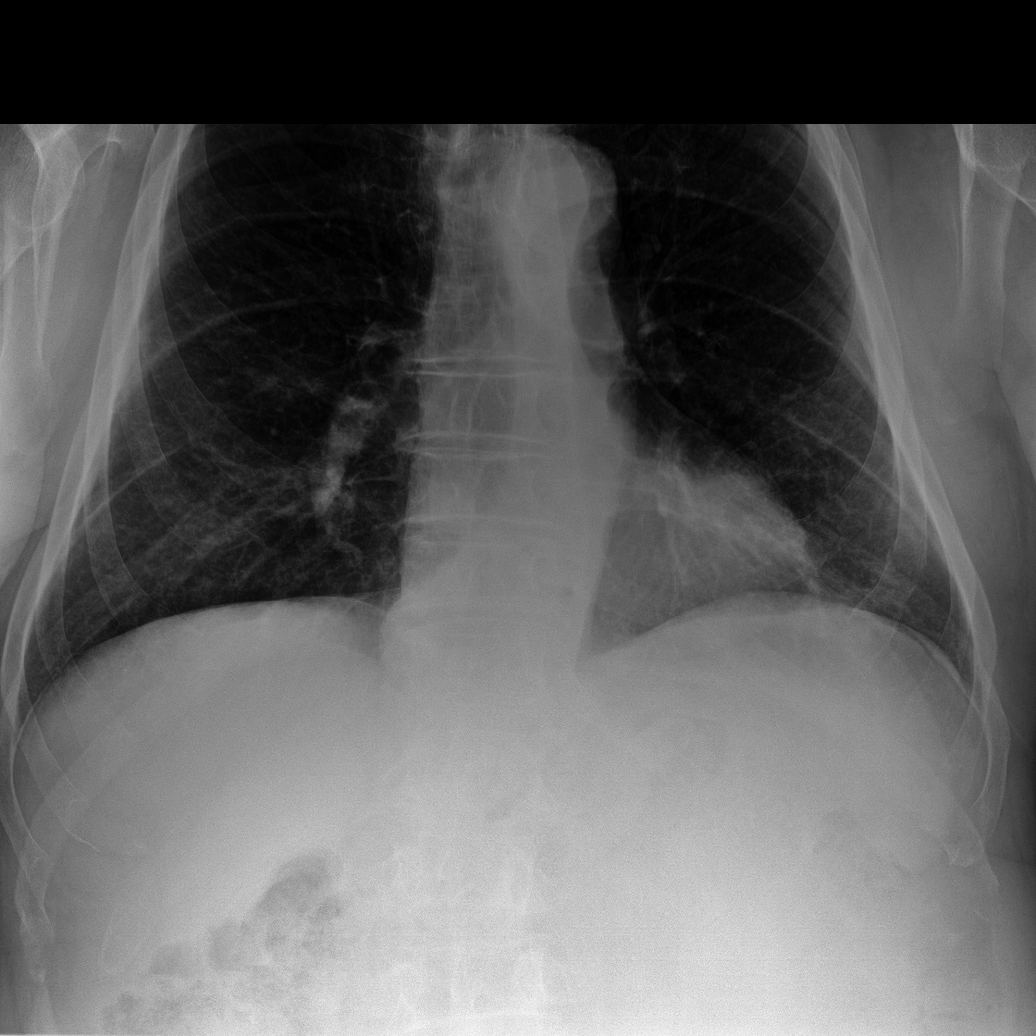
[im 3/5]
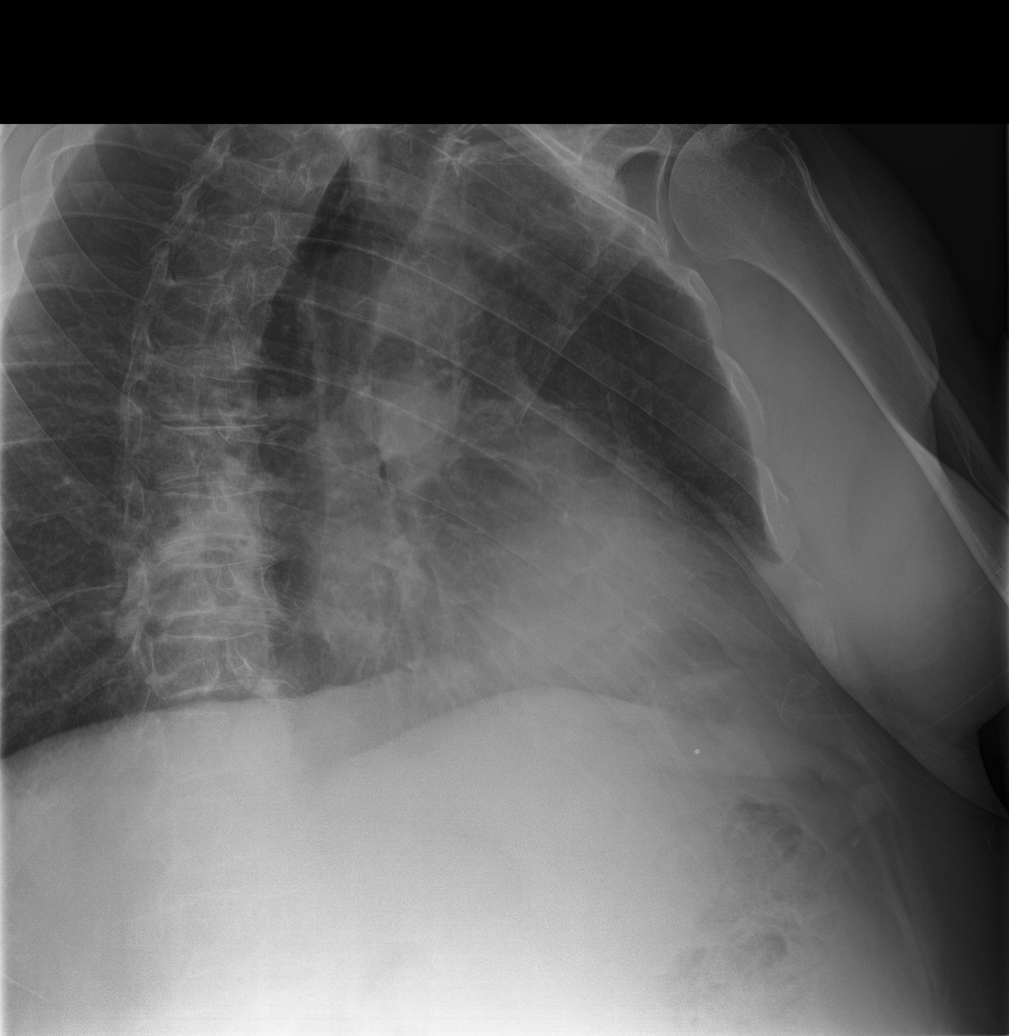
[im 4/5]
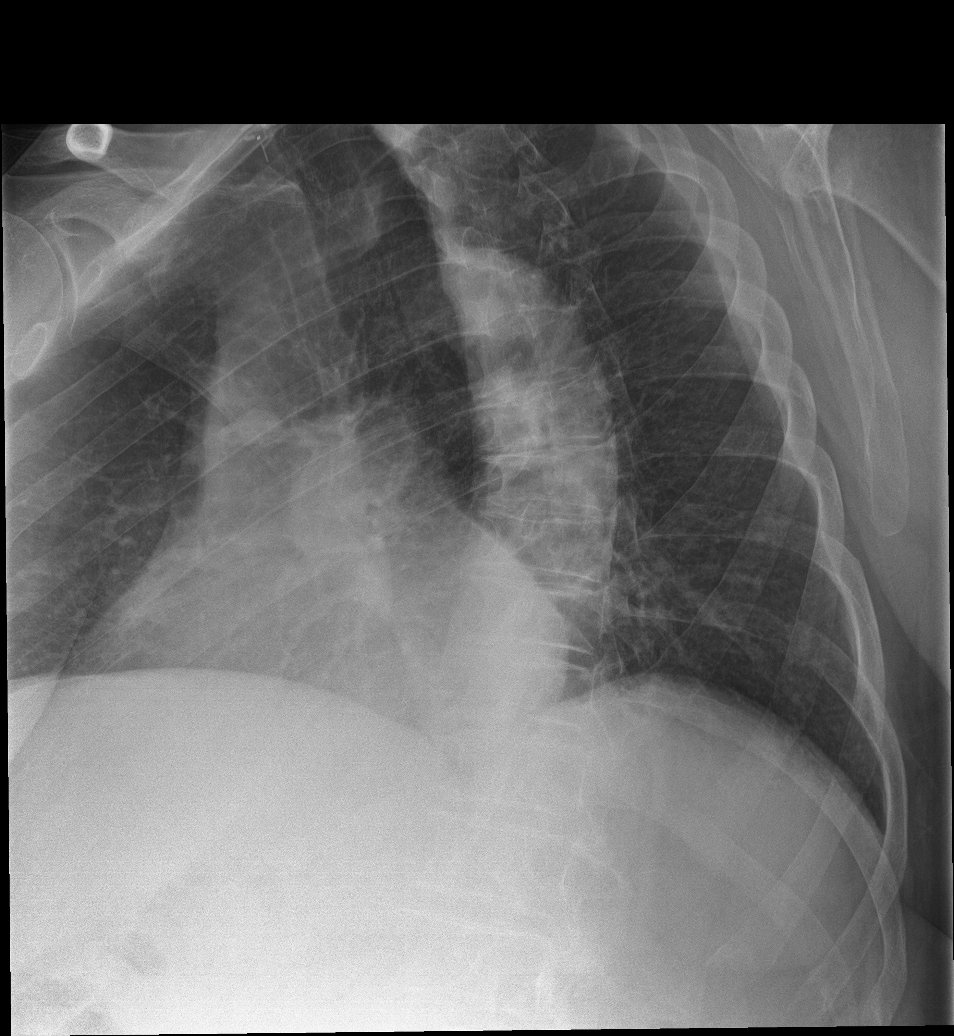
[im 5/5]
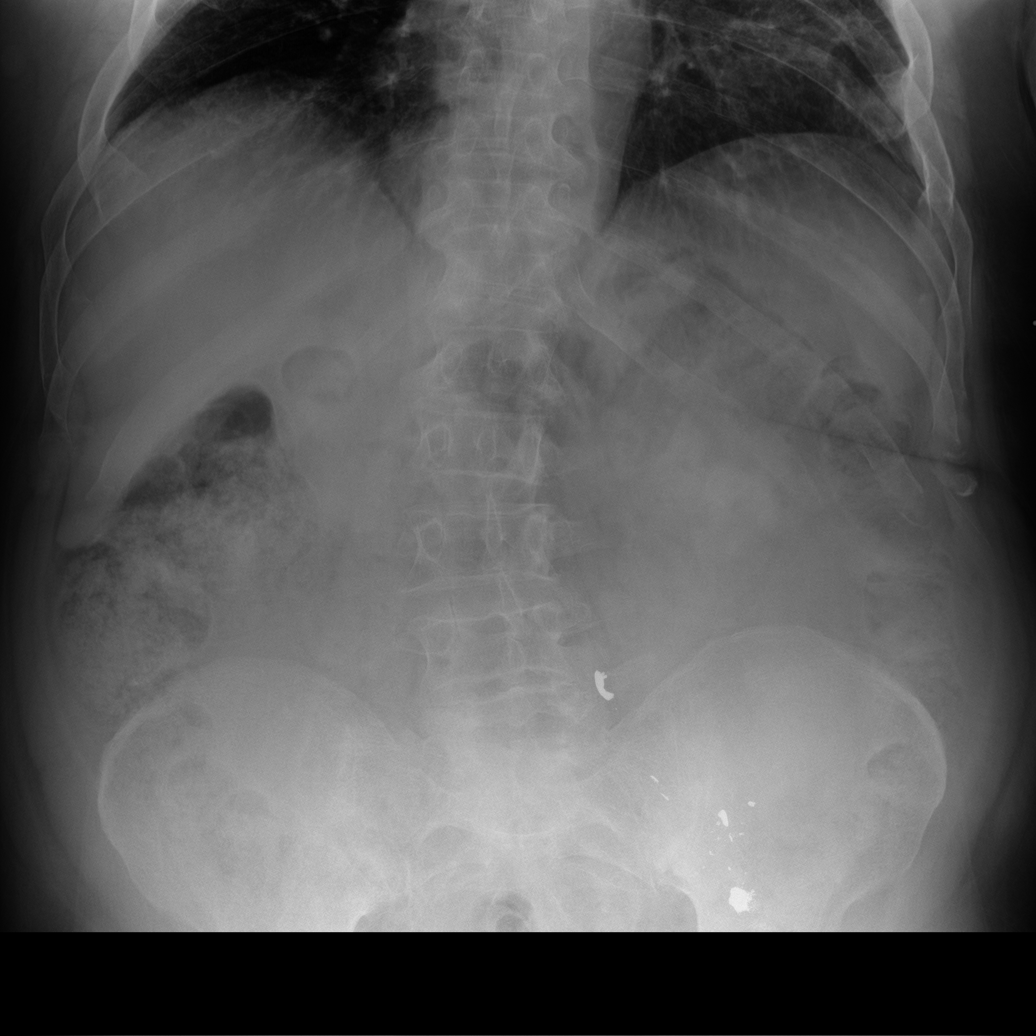

[5 of 5 positions shown; findings below may reference images not displayed]

FINDINGS: Cardiac shadow is stable. Patchy scarring is noted in the medial
aspect of the left lung base. No pneumothorax is seen. No sizable
effusion is noted. No acute rib fracture is noted. Mild irregularity
is noted in the anterior aspect of the sixth and seventh ribs with
healing consistent with prior fractures.
IMPRESSION: Old healed rib fractures on the left.  No acute abnormality noted.

## 2021-04-05 ENCOUNTER — Telehealth: Payer: Self-pay | Admitting: Family Medicine

## 2021-04-05 NOTE — Telephone Encounter (Signed)
Home Health Verbal Orders - Caller/Agency:jeff/[facility] Callback Number:[phone] Requesting :Skilled Nursing Frequency:1x3 and every other week for disease management for copd

## 2021-04-05 NOTE — Telephone Encounter (Signed)
That's fine

## 2021-04-05 NOTE — Telephone Encounter (Signed)
Left message advising Ryan Ali,   -Mickel Baas

## 2021-04-09 ENCOUNTER — Other Ambulatory Visit: Payer: Self-pay

## 2021-04-09 ENCOUNTER — Ambulatory Visit (INDEPENDENT_AMBULATORY_CARE_PROVIDER_SITE_OTHER): Payer: Medicare HMO | Admitting: Family Medicine

## 2021-04-09 ENCOUNTER — Telehealth: Payer: Self-pay

## 2021-04-09 ENCOUNTER — Encounter: Payer: Self-pay | Admitting: Family Medicine

## 2021-04-09 VITALS — BP 99/55 | HR 121 | Temp 98.6°F | Resp 20 | Ht 67.0 in | Wt 206.0 lb

## 2021-04-09 DIAGNOSIS — J441 Chronic obstructive pulmonary disease with (acute) exacerbation: Secondary | ICD-10-CM

## 2021-04-09 DIAGNOSIS — R634 Abnormal weight loss: Secondary | ICD-10-CM

## 2021-04-09 DIAGNOSIS — E890 Postprocedural hypothyroidism: Secondary | ICD-10-CM

## 2021-04-09 DIAGNOSIS — R000 Tachycardia, unspecified: Secondary | ICD-10-CM

## 2021-04-09 DIAGNOSIS — R0602 Shortness of breath: Secondary | ICD-10-CM

## 2021-04-09 DIAGNOSIS — N179 Acute kidney failure, unspecified: Secondary | ICD-10-CM

## 2021-04-09 DIAGNOSIS — K219 Gastro-esophageal reflux disease without esophagitis: Secondary | ICD-10-CM

## 2021-04-09 DIAGNOSIS — M1990 Unspecified osteoarthritis, unspecified site: Secondary | ICD-10-CM | POA: Diagnosis not present

## 2021-04-09 DIAGNOSIS — E039 Hypothyroidism, unspecified: Secondary | ICD-10-CM

## 2021-04-09 MED ORDER — LEVOTHYROXINE SODIUM 125 MCG PO TABS: 125.0000 ug | ORAL_TABLET | Freq: Every day | ORAL | 1 refills | Status: DC

## 2021-04-09 MED ORDER — TAMSULOSIN HCL 0.4 MG PO CAPS: 0.4000 mg | ORAL_CAPSULE | Freq: Every day | ORAL | 1 refills | Status: DC

## 2021-04-09 MED ORDER — ATENOLOL 25 MG PO TABS: 25.0000 mg | ORAL_TABLET | Freq: Every day | ORAL | 3 refills | Status: DC

## 2021-04-09 NOTE — Assessment & Plan Note (Addendum)
Here for hospital follow up but with continued DOE despite completion of steroid course with minimal improvement with albuterol. Also with tachycardia despite continuation of BB, anterior T wave changes on today's EKG as compared to hospitalization EKG, hypoxic with ambulation, prior h/o PE/DVT and not currently on anticoagulation, as well as inconclusive VQ scan at recent hospitalization, significant concern for PE. Recommended prompt presentation to ED for CTA PE given most recent Cr at baseline however patient and wife unwilling to wait in ED waiting room. States will present tomorrow despite my repeated strong recommendation to go today.

## 2021-04-09 NOTE — Telephone Encounter (Signed)
Copied from [place] [id number]. Topic: General - Other >> Apr 09, 2021  2:56 PM Pawlus, Brayton Layman A wrote: Reason for CRM: Pts wife was following up on a CT scan order, pt was seen today and was unable to get this done at [facility] urgent care, please advise.

## 2021-04-09 NOTE — Patient Instructions (Signed)
It was great to see you!  Our plans for today:  - I recommend you to present to the emergency room TODAY to assess for PE.    We are checking some labs today, we will release these results to your MyChart.  Take care and seek immediate care sooner if you develop any concerns.   Dr. Ky Barban

## 2021-04-09 NOTE — Assessment & Plan Note (Signed)
Has gained 4 lbs since discharge. Will obtain TSH after previous adjustment. Has follow up with oncologist.

## 2021-04-09 NOTE — Progress Notes (Addendum)
° °  SUBJECTIVE:   CHIEF COMPLAINT / HPI:   HOSPITAL FOLLOW UP Hospital/facility: [facility] 12/30-03/29/21 Diagnosis: SOB, cough 2/2 COPD exacerbation Procedures/tests:  - ECHO LVEF 60-65%, G1DD. - CT CAP - no pulm nodules, diffuse emphysema, changes of resp bronchiolitis - VQ scan - diminished perfusion in RUL, likely 2/2 emphysema, cannot exclude emboli Consultants: none New medications:  - prednisone x3 days - supplemental O2 2L - held atenolol due to lower BP Discharge instructions:   Status: same - reports hasn't needed to wear O2. Lowest 93% at rest - still with DOE.  - no fevers - finished prednisone, didn't notice a difference in breathing.  - has continued to take atenolol - albuterol helping a little.  - good compliance with symbicort - RUL PE and DVT in R arm in 2009, not currently on anticoagulation. Stopped plavix last month. - denies leg pain or changes in swelling. - denies chest pain  Unintentional weight loss - down 25lb since 07/2020. - previously seen for same 12/2020 with PCP, at that time with loss of appetite and fatigue. Labs notable for overactive thyroid, M-spike but albumin and globulin levels normal. Synthroid adjusted.  - has gained 4 lbs since discharge. - has upcoming f/u Hematologist in next few months.   OBJECTIVE:   BP (!) 99/55 (BP Location: Left Arm, Patient Position: Sitting, Cuff Size: Large)    Pulse (!) 121    Temp 98.6 F (37 C) (Temporal)    Resp 20    Ht 5\' 7"  (1.702 m)    Wt 206 lb (93.4 kg)    SpO2 93%    BMI 32.26 kg/m   Gen: elderly, in NAD Card: reg rhythm, tachycardic. No murmur Lungs: CTAB. No wheeze, rales, rhonchi. Speaks in full sentences. Increased WOB after walking. Hypoxic to 81% with ambulation. Ext: WWP, 1+ pitting edema to shins   ASSESSMENT/PLAN:   Hypothyroidism Recheck labs.   Unintentional weight loss Has gained 4 lbs since discharge. Will obtain TSH after previous adjustment. Has follow up with  oncologist.  SOBOE (shortness of breath on exertion) Here for hospital follow up but with continued DOE despite completion of steroid course with minimal improvement with albuterol. Also with tachycardia despite continuation of BB, anterior T wave changes on today's EKG as compared to hospitalization EKG, hypoxic with ambulation, prior h/o PE/DVT and not currently on anticoagulation, as well as inconclusive VQ scan at recent hospitalization, significant concern for PE. Recommended prompt presentation to ED for CTA PE given most recent Cr at baseline however patient and wife unwilling to wait in ED waiting room. States will present tomorrow despite my repeated strong recommendation to go today.     Myles Gip, DO

## 2021-04-09 NOTE — Telephone Encounter (Signed)
Spoke with patient's wife and reiterated recommendation to present to ED for CT scan given concern for acute PE. Wife stated they would go in the morning as they did not want to wait in the waiting room for hours.

## 2021-04-09 NOTE — Assessment & Plan Note (Signed)
Recheck labs 

## 2021-04-09 NOTE — Assessment & Plan Note (Signed)
>>  ASSESSMENT AND PLAN FOR HYPOTHYROIDISM WRITTEN ON 04/09/2021 11:51 AM BY RUMBALL, Child [place] HERO, DO  Recheck labs.

## 2021-04-10 DIAGNOSIS — E039 Hypothyroidism, unspecified: Secondary | ICD-10-CM | POA: Diagnosis not present

## 2021-04-11 ENCOUNTER — Ambulatory Visit
Admission: RE | Admit: 2021-04-11 | Discharge: 2021-04-11 | Disposition: A | Discharge status: Home / Self Care | Payer: Medicare HMO | Source: Ambulatory Visit | Attending: Family Medicine | Admitting: Family Medicine

## 2021-04-11 ENCOUNTER — Encounter: Payer: Self-pay | Admitting: Family Medicine

## 2021-04-11 ENCOUNTER — Other Ambulatory Visit: Payer: Self-pay | Admitting: Family Medicine

## 2021-04-11 DIAGNOSIS — R0789 Other chest pain: Secondary | ICD-10-CM | POA: Diagnosis not present

## 2021-04-11 DIAGNOSIS — R7989 Other specified abnormal findings of blood chemistry: Secondary | ICD-10-CM | POA: Insufficient documentation

## 2021-04-11 DIAGNOSIS — R0602 Shortness of breath: Secondary | ICD-10-CM | POA: Diagnosis not present

## 2021-04-11 DIAGNOSIS — J439 Emphysema, unspecified: Secondary | ICD-10-CM | POA: Diagnosis not present

## 2021-04-11 DIAGNOSIS — K449 Diaphragmatic hernia without obstruction or gangrene: Secondary | ICD-10-CM | POA: Diagnosis not present

## 2021-04-11 LAB — BASIC METABOLIC PANEL
BUN/Creatinine Ratio: 11 (ref 10–24)
BUN: 16 mg/dL (ref 8–27)
CO2: 24 mmol/L (ref 20–29)
Calcium: 8.5 mg/dL — ABNORMAL LOW (ref 8.6–10.2)
Chloride: 108 mmol/L — ABNORMAL HIGH (ref 96–106)
Creatinine, Ser: 1.42 mg/dL — ABNORMAL HIGH (ref 0.76–1.27)
Glucose: 112 mg/dL — ABNORMAL HIGH (ref 70–99)
Potassium: 4.2 mmol/L (ref 3.5–5.2)
Sodium: 143 mmol/L (ref 134–144)
eGFR: 50 mL/min/{1.73_m2} — ABNORMAL LOW (ref >59)

## 2021-04-11 LAB — THYROID PANEL WITH TSH
Free Thyroxine Index: 1.7 (ref 1.2–4.9)
T3 Uptake Ratio: 28 % (ref 24–39)
T4, Total: 6 ug/dL (ref 4.5–12.0)
TSH: 12.8 u[IU]/mL — ABNORMAL HIGH (ref 0.450–4.500)

## 2021-04-11 MED ORDER — LEVOTHYROXINE SODIUM 150 MCG PO TABS: 150.0000 ug | ORAL_TABLET | Freq: Every day | ORAL | 0 refills | Status: DC

## 2021-04-11 MED ORDER — IOHEXOL 350 MG/ML SOLN
75.0000 mL | Freq: Once | INTRAVENOUS | Status: AC | PRN
  Administered 2021-04-11: 100 mL via INTRAVENOUS

## 2021-04-11 NOTE — Addendum Note (Signed)
Addended by: Myles Gip on: 04/11/2021 12:45 PM   Modules accepted: Orders

## 2021-04-12 ENCOUNTER — Telehealth: Payer: Self-pay

## 2021-04-12 DIAGNOSIS — E039 Hypothyroidism, unspecified: Secondary | ICD-10-CM

## 2021-04-12 NOTE — Telephone Encounter (Signed)
-----   Message from [place], [place] sent at 04/11/2021  1:24 PM EST -----  ----- Message ----- From: Myles Gip, DO Sent: 04/11/2021  12:44 PM EST To: [facility]  Kidney function as returned to baseline. Dr. Caryn Section has ordered his CT scan, so we'll keep an eye out for those results.   His thyroid is a little underactive. I will increase his synthroid dose. We will plan to recheck his levels in 6 weeks.

## 2021-04-12 NOTE — Telephone Encounter (Signed)
Patient's wife Angell Honse aware.

## 2021-04-13 ENCOUNTER — Telehealth: Payer: Self-pay | Admitting: Family Medicine

## 2021-04-13 NOTE — Telephone Encounter (Signed)
That's fine

## 2021-04-13 NOTE — Telephone Encounter (Signed)
Called [place] and gave verbal orders.

## 2021-04-13 NOTE — Telephone Encounter (Signed)
Home Health Verbal Orders - Caller/Agency: [facility] Number: [phone] Requesting OT/PT/Skilled Nursing/Social Work/Speech Therapy:  Frequency:   Requesting OT 1w6

## 2021-04-16 ENCOUNTER — Telehealth: Payer: Self-pay

## 2021-04-16 NOTE — Telephone Encounter (Signed)
Copied from [place] [phone]. Topic: Appointment Scheduling - Scheduling Inquiry for Clinic >> Apr 16, 2021  1:23 PM Pawlus, Apolonio Schneiders wrote: Reason for CRM: Pts wife cancelled her appt for tomorrow 1/24 with Junie Panning Mecum, caller wanted the pt to be seen in her place tomorrow, please advise.

## 2021-04-16 NOTE — Telephone Encounter (Signed)
Pt wife called back and the 1/26 appt is fine and thanks!

## 2021-04-16 NOTE — Telephone Encounter (Signed)
Copied from [place] [id number]. Topic: Appointment Scheduling - Scheduling Inquiry for Clinic >> Apr 16, 2021  1:23 PM Pawlus, Apolonio Schneiders wrote: Reason for CRM: Pts wife cancelled her appt for tomorrow 1/24 with Junie Panning Mecum, caller wanted the pt to be seen in her place tomorrow, please advise.

## 2021-04-16 NOTE — Telephone Encounter (Signed)
Left message on VM that the appt. Gay cancelled has already be filled by another patient and that I have put Debra on 04/19/21 at 1:20 with Erin.   To call back to confirm.

## 2021-04-18 DIAGNOSIS — E1142 Type 2 diabetes mellitus with diabetic polyneuropathy: Secondary | ICD-10-CM | POA: Diagnosis not present

## 2021-04-18 DIAGNOSIS — E1151 Type 2 diabetes mellitus with diabetic peripheral angiopathy without gangrene: Secondary | ICD-10-CM | POA: Diagnosis not present

## 2021-04-18 DIAGNOSIS — E890 Postprocedural hypothyroidism: Secondary | ICD-10-CM | POA: Diagnosis not present

## 2021-04-18 DIAGNOSIS — J441 Chronic obstructive pulmonary disease with (acute) exacerbation: Secondary | ICD-10-CM | POA: Diagnosis not present

## 2021-04-18 DIAGNOSIS — J9601 Acute respiratory failure with hypoxia: Secondary | ICD-10-CM | POA: Diagnosis not present

## 2021-04-18 DIAGNOSIS — E1122 Type 2 diabetes mellitus with diabetic chronic kidney disease: Secondary | ICD-10-CM | POA: Diagnosis not present

## 2021-04-18 DIAGNOSIS — I129 Hypertensive chronic kidney disease with stage 1 through stage 4 chronic kidney disease, or unspecified chronic kidney disease: Secondary | ICD-10-CM | POA: Diagnosis not present

## 2021-04-18 DIAGNOSIS — I70203 Unspecified atherosclerosis of native arteries of extremities, bilateral legs: Secondary | ICD-10-CM | POA: Diagnosis not present

## 2021-04-18 DIAGNOSIS — G620 Drug-induced polyneuropathy: Secondary | ICD-10-CM | POA: Diagnosis not present

## 2021-04-19 ENCOUNTER — Encounter: Payer: Self-pay | Admitting: Physician Assistant

## 2021-04-19 ENCOUNTER — Other Ambulatory Visit: Payer: Self-pay

## 2021-04-19 ENCOUNTER — Ambulatory Visit (INDEPENDENT_AMBULATORY_CARE_PROVIDER_SITE_OTHER): Payer: Medicare HMO | Admitting: Physician Assistant

## 2021-04-19 VITALS — BP 130/80 | HR 86 | Temp 98.4°F | Ht 65.0 in | Wt 209.5 lb

## 2021-04-19 DIAGNOSIS — R600 Localized edema: Secondary | ICD-10-CM

## 2021-04-19 DIAGNOSIS — K1370 Unspecified lesions of oral mucosa: Secondary | ICD-10-CM | POA: Diagnosis not present

## 2021-04-19 MED ORDER — MEDICAL COMPRESSION SOCKS MISC: 0 refills | Status: AC

## 2021-04-19 MED ORDER — NYSTATIN 100000 UNIT/ML MT SUSP: 5.0000 mL | Freq: Three times a day (TID) | OROMUCOSAL | 1 refills | Status: DC

## 2021-04-19 NOTE — Progress Notes (Signed)
Established patient visit  I,[name],acting as a scribe for [employer], PA-C.,have documented all relevant documentation on the behalf of Ryan Loring, PA-C,as directed by  Ryan Ali E Daritza Brees, PA-C while in the presence of Ryan Ramiro E Edina Winningham, PA-C.   Patient: Ryan Ali   DOB: 1940/07/23   81 y.o. Male  MRN: [id number] Visit Date: 04/19/2021  Today's healthcare provider: Dani Gobble Ethaniel Garfield, PA-C   CC: concerns for cellulitis on ankles and lesion in mouth   Subjective    HPI HPI     Mouth Lesions    Additional comments: Left leg      Last edited by Ryan Ali, CMA on 04/19/2021  1:32 PM.        Mouth: painful 1/10  Pain to a 3-4/10 when he chews food on the right side  States there is a sore or other type of lesion on the roof of his mouth Is rinsing his mouth after using inhalers Started about 1 week ago Has not changed in severity   Legs:  Patient and wife express concerns for bilateral leg swelling, itching and drainage Wife states the areas are warm and red  Went in to hospital on 12-30 and was discharged on 1-5 for COPD exacerbation  Some concern for CHF while in hospital   Wife states that he was supposed to have surgery for kidney and bladder stone removal - was diagnosed with hydronephrosis but she states that has resolved     Medications: Outpatient Medications Prior to Visit  Medication Sig   albuterol (VENTOLIN HFA) 108 (90 Base) MCG/ACT inhaler Inhale 2 puffs into the lungs every 6 (six) hours as needed for shortness of breath or wheezing.   Ascorbic Acid (VITAMIN C PO) Take 1,400 mg by mouth daily.   aspirin EC 81 MG tablet Take 81 mg by mouth daily. Swallow whole.   ASTAXANTHIN PO Take 12 mg by mouth daily.   atenolol (TENORMIN) 25 MG tablet Take 1 tablet (25 mg total) by mouth daily. Take one tablet   brimonidine (ALPHAGAN) 0.2 % ophthalmic solution Place 1 drop into the right eye in the morning and at bedtime.   budesonide-formoterol  (SYMBICORT) 160-4.5 MCG/ACT inhaler Inhale 2 puffs into the lungs 2 (two) times daily.   Calcium Carbonate-Vit D-Min (CALCIUM 600+D PLUS MINERALS) 600-400 MG-UNIT CHEW Chew 3 tablets by mouth daily.   Cholecalciferol (VITAMIN D PO) Take 1 tablet by mouth daily. 2000 mg   clopidogrel (PLAVIX) 75 MG tablet Take 1 tablet (75 mg total) by mouth daily at 6 (six) AM.   levothyroxine (SYNTHROID) 150 MCG tablet Take 1 tablet (150 mcg total) by mouth daily.   pantoprazole (PROTONIX) 40 MG tablet Take 40 mg by mouth daily.   tamsulosin (FLOMAX) 0.4 MG CAPS capsule Take 1 capsule (0.4 mg total) by mouth daily.   Tiotropium Bromide Monohydrate 1.25 MCG/ACT AERS INHALE 2 INHALATIONS ORAL INHALATION EVERY MORNING *REPLACES TIOTROPIUM HANDIHALER*   omeprazole (PRILOSEC) 40 MG capsule Take 40 mg by mouth daily. (Patient not taking: Reported on 04/09/2021)   Pregabalin (LYRICA PO) Take by mouth 2 (two) times daily.   No facility-administered medications prior to visit.    Review of Systems  Constitutional:  Negative for appetite change, chills and fever.  Respiratory:  Negative for chest tightness, shortness of breath and wheezing.   Cardiovascular:  Negative for chest pain and palpitations.  Gastrointestinal:  Negative for abdominal pain, nausea and vomiting.  Objective    BP 130/80 (BP Location: Left Arm, Patient Position: Sitting, Cuff Size: Normal)    Pulse 86    Temp 98.4 F (36.9 C) (Temporal)    Ht 5\' 5"  (1.651 m)    Wt 209 lb 8 oz (95 kg)    SpO2 97%    BMI 34.86 kg/m  {Show previous vital signs (optional):23777}  Physical Exam Vitals reviewed.  Constitutional:      Appearance: Normal appearance.  HENT:     Head: Normocephalic and atraumatic.     Mouth/Throat:     Mouth: Mucous membranes are moist.     Palate: Lesions present.   Cardiovascular:     Rate and Rhythm: Normal rate and regular rhythm.     Pulses: Normal pulses.     Heart sounds: Normal heart sounds.  Pulmonary:      Effort: Pulmonary effort is normal.     Breath sounds: Normal breath sounds and air entry. No decreased breath sounds, wheezing, rhonchi or rales.  Musculoskeletal:     Right lower leg: 2+ Pitting Edema present.     Left lower leg: 2+ Pitting Edema present.  Neurological:     Mental Status: He is alert.      No results found for any visits on 04/19/21.  Assessment & Plan     1. Bilateral leg edema Acute, new problem Bilateral pitting edema that began one week ago with itching Notable skin breaks and erythema at areas of scratching  Patient is recovering from AKI and GFR is improving  Recent echo performed this month was normal- does not indicate HF  BP is well controlled in office  Discussed patient and overall history with Dr. Brita Romp for appropriate next steps Recommend compression stockings and elevation at this time - normal echo, reduced kidney function at this time, make need for diuretic unlikely at this time Follow up in 2 weeks to assess progress with compression and elevation    - Elastic Bandages & Supports (MEDICAL COMPRESSION SOCKS) MISC; Put on compression socks in the AM as soon as you wake up  Dispense: 4 each; Refill: 0  2. Mouth lesion  Acute, new problem Mildly painful new lesion present for approx 1 week Appears to be caused by trauma to the palate but will include coverage for potential candida with magic mouth wash Provided Magic mouthwash 13mL TID - swish and spit  Follow up if not resolving    - magic mouthwash (nystatin, lidocaine, diphenhydrAMINE, alum & mag hydroxide) suspension; Swish and spit 5 mLs 3 (three) times daily.  Dispense: 180 mL; Refill: 1      The entirety of the information documented in the History of Present Illness, Review of Systems and Physical Exam were personally obtained by me. Portions of this information were initially documented by the CMA and reviewed by me for thoroughness and accuracy.   Dani Gobble Mikaella Escalona,  PA-C     Almon Register, PA-C  [employer] [phone] (phone) [phone] (fax)  [place]

## 2021-04-19 NOTE — Assessment & Plan Note (Signed)
Acute, new problem Bilateral pitting edema that began one week ago with itching Notable skin breaks and erythema at areas of scratching  Discussed patient and overall history with Dr. Brita Romp for appropriate next steps Recommend compression stockings and elevation at this time - normal echo, reduced kidney function at this time, make need for diuretic unlikely at this time Follow up in 2 weeks to assess progress with compression and elevation

## 2021-04-19 NOTE — Patient Instructions (Addendum)
Sleep hygiene (Practices to help improve sleep)  *Try to go to bed at the same time every night *Make your room as dark and quiet as possible for sleep (ambient noises or sleep machine is okay too) *Try to establish a bedtime routine before bed (shower, reading for a little bit, etc.) *No TVs or phone for at least 33min to an hour before bed. (The blue light from these devices can affect your sleep-wake cycle and make it harder to go to sleep) *Avoid caffeine in the afternoon and especially before bedtime.  You can take 2mg  of Melatonin about 30 minutes to an hour before bed to help with sleep as well The goal is to establish a relaxing environment and routine that signals your body that it is time to go to sleep.    I think your leg swelling is likely the result of fluid build-up  In order to help with this I am recommending that you use compression stockings and elevate your feet This will help improve blood flow and hopefully improve the swelling You can apply Hydrocortisone to the area before compression stockings are put on to help with itching  Please follow up in 2 weeks if the swelling in your legs is not improving  It was nice to meet you and I appreciate the opportunity to be involved in your care

## 2021-04-20 ENCOUNTER — Encounter: Payer: Self-pay | Admitting: Physician Assistant

## 2021-04-20 NOTE — Telephone Encounter (Signed)
Prefer that magic mouthwash be in following ration 1:1:1:1 with nystatin, hydrocortisone, diphenhydramine, and lidocaine

## 2021-04-23 ENCOUNTER — Other Ambulatory Visit: Payer: Self-pay

## 2021-04-23 ENCOUNTER — Other Ambulatory Visit: Payer: Self-pay | Admitting: *Deleted

## 2021-04-23 DIAGNOSIS — K1370 Unspecified lesions of oral mucosa: Secondary | ICD-10-CM

## 2021-04-23 MED ORDER — NYSTATIN 100000 UNIT/ML MT SUSP: 5.0000 mL | Freq: Three times a day (TID) | OROMUCOSAL | 1 refills | Status: DC

## 2021-04-23 NOTE — Telephone Encounter (Signed)
Please inform patient that his script was transferred to [facility] in [place] so it can be compounded per specifications.  It should be ready for pick up

## 2021-04-30 ENCOUNTER — Telehealth: Payer: Self-pay

## 2021-04-30 DIAGNOSIS — R2681 Unsteadiness on feet: Secondary | ICD-10-CM | POA: Diagnosis not present

## 2021-04-30 NOTE — Telephone Encounter (Signed)
Copied from [place] [phone]. Topic: General - Other >> Apr 27, 2021  1:23 PM Yvette Rack wrote: Reason for CRM: Shawn with [facility] reports patient had a missed PT visit. Cb# [phone]

## 2021-04-30 NOTE — Telephone Encounter (Signed)
Copied from [place] [phone]. Topic: General - Other >> Apr 27, 2021  1:46 PM [place], Nelva Bush wrote: Reason for CRM: Melissa with [facility] reports patient had a missed OT.  Stated pt has been sick all week.

## 2021-05-02 ENCOUNTER — Telehealth: Payer: Self-pay | Admitting: Family Medicine

## 2021-05-02 NOTE — Telephone Encounter (Signed)
Shaun from [facility] called and stated that the Pt wants to discharge from PT services immediately / please advise

## 2021-05-03 ENCOUNTER — Ambulatory Visit: Payer: Medicare HMO | Admitting: Physician Assistant

## 2021-05-25 DIAGNOSIS — E039 Hypothyroidism, unspecified: Secondary | ICD-10-CM | POA: Diagnosis not present

## 2021-05-26 LAB — TSH+FREE T4
Free T4: 1.57 ng/dL (ref 0.82–1.77)
TSH: 0.383 u[IU]/mL — ABNORMAL LOW (ref 0.450–4.500)

## 2021-05-28 DIAGNOSIS — R2681 Unsteadiness on feet: Secondary | ICD-10-CM | POA: Diagnosis not present

## 2021-06-06 DIAGNOSIS — I2510 Atherosclerotic heart disease of native coronary artery without angina pectoris: Secondary | ICD-10-CM | POA: Diagnosis not present

## 2021-06-06 DIAGNOSIS — I10 Essential (primary) hypertension: Secondary | ICD-10-CM | POA: Diagnosis not present

## 2021-06-06 DIAGNOSIS — I70219 Atherosclerosis of native arteries of extremities with intermittent claudication, unspecified extremity: Secondary | ICD-10-CM | POA: Diagnosis not present

## 2021-06-09 ENCOUNTER — Other Ambulatory Visit: Payer: Self-pay | Admitting: Family Medicine

## 2021-06-09 DIAGNOSIS — R000 Tachycardia, unspecified: Secondary | ICD-10-CM

## 2021-06-14 DIAGNOSIS — R0600 Dyspnea, unspecified: Secondary | ICD-10-CM | POA: Diagnosis not present

## 2021-06-25 ENCOUNTER — Other Ambulatory Visit: Payer: Self-pay | Admitting: *Deleted

## 2021-06-25 DIAGNOSIS — E039 Hypothyroidism, unspecified: Secondary | ICD-10-CM

## 2021-06-25 MED ORDER — LEVOTHYROXINE SODIUM 150 MCG PO TABS: 150.0000 ug | ORAL_TABLET | Freq: Every day | ORAL | 1 refills | Status: DC

## 2021-06-28 DIAGNOSIS — R2681 Unsteadiness on feet: Secondary | ICD-10-CM | POA: Diagnosis not present

## 2021-07-14 DIAGNOSIS — J449 Chronic obstructive pulmonary disease, unspecified: Secondary | ICD-10-CM | POA: Diagnosis not present

## 2021-07-14 DIAGNOSIS — Z6833 Body mass index (BMI) 33.0-33.9, adult: Secondary | ICD-10-CM | POA: Diagnosis not present

## 2021-07-14 DIAGNOSIS — Z825 Family history of asthma and other chronic lower respiratory diseases: Secondary | ICD-10-CM | POA: Diagnosis not present

## 2021-07-14 DIAGNOSIS — Z8249 Family history of ischemic heart disease and other diseases of the circulatory system: Secondary | ICD-10-CM | POA: Diagnosis not present

## 2021-07-14 DIAGNOSIS — I10 Essential (primary) hypertension: Secondary | ICD-10-CM | POA: Diagnosis not present

## 2021-07-14 DIAGNOSIS — N400 Enlarged prostate without lower urinary tract symptoms: Secondary | ICD-10-CM | POA: Diagnosis not present

## 2021-07-14 DIAGNOSIS — M1990 Unspecified osteoarthritis, unspecified site: Secondary | ICD-10-CM | POA: Diagnosis not present

## 2021-07-14 DIAGNOSIS — H409 Unspecified glaucoma: Secondary | ICD-10-CM | POA: Diagnosis not present

## 2021-07-14 DIAGNOSIS — Z008 Encounter for other general examination: Secondary | ICD-10-CM | POA: Diagnosis not present

## 2021-07-14 DIAGNOSIS — G629 Polyneuropathy, unspecified: Secondary | ICD-10-CM | POA: Diagnosis not present

## 2021-07-14 DIAGNOSIS — K219 Gastro-esophageal reflux disease without esophagitis: Secondary | ICD-10-CM | POA: Diagnosis not present

## 2021-07-14 DIAGNOSIS — E669 Obesity, unspecified: Secondary | ICD-10-CM | POA: Diagnosis not present

## 2021-07-14 DIAGNOSIS — E890 Postprocedural hypothyroidism: Secondary | ICD-10-CM | POA: Diagnosis not present

## 2021-07-20 DIAGNOSIS — Z6833 Body mass index (BMI) 33.0-33.9, adult: Secondary | ICD-10-CM | POA: Diagnosis not present

## 2021-07-20 DIAGNOSIS — C9000 Multiple myeloma not having achieved remission: Secondary | ICD-10-CM | POA: Diagnosis not present

## 2021-07-20 DIAGNOSIS — I2510 Atherosclerotic heart disease of native coronary artery without angina pectoris: Secondary | ICD-10-CM | POA: Diagnosis not present

## 2021-07-20 DIAGNOSIS — E1122 Type 2 diabetes mellitus with diabetic chronic kidney disease: Secondary | ICD-10-CM | POA: Diagnosis not present

## 2021-07-20 DIAGNOSIS — I700 Atherosclerosis of aorta: Secondary | ICD-10-CM | POA: Diagnosis not present

## 2021-07-20 DIAGNOSIS — I129 Hypertensive chronic kidney disease with stage 1 through stage 4 chronic kidney disease, or unspecified chronic kidney disease: Secondary | ICD-10-CM | POA: Diagnosis not present

## 2021-07-20 DIAGNOSIS — N182 Chronic kidney disease, stage 2 (mild): Secondary | ICD-10-CM | POA: Diagnosis not present

## 2021-07-20 DIAGNOSIS — Z9181 History of falling: Secondary | ICD-10-CM | POA: Diagnosis not present

## 2021-07-20 DIAGNOSIS — N200 Calculus of kidney: Secondary | ICD-10-CM | POA: Diagnosis not present

## 2021-07-20 DIAGNOSIS — E669 Obesity, unspecified: Secondary | ICD-10-CM | POA: Diagnosis not present

## 2021-07-20 DIAGNOSIS — R0902 Hypoxemia: Secondary | ICD-10-CM | POA: Diagnosis not present

## 2021-07-20 DIAGNOSIS — J449 Chronic obstructive pulmonary disease, unspecified: Secondary | ICD-10-CM | POA: Diagnosis not present

## 2021-07-20 DIAGNOSIS — J432 Centrilobular emphysema: Secondary | ICD-10-CM | POA: Diagnosis not present

## 2021-07-27 DIAGNOSIS — C9000 Multiple myeloma not having achieved remission: Secondary | ICD-10-CM | POA: Diagnosis not present

## 2021-07-27 DIAGNOSIS — I700 Atherosclerosis of aorta: Secondary | ICD-10-CM | POA: Diagnosis not present

## 2021-07-27 DIAGNOSIS — J432 Centrilobular emphysema: Secondary | ICD-10-CM | POA: Diagnosis not present

## 2021-07-27 DIAGNOSIS — N182 Chronic kidney disease, stage 2 (mild): Secondary | ICD-10-CM | POA: Diagnosis not present

## 2021-07-27 DIAGNOSIS — I2510 Atherosclerotic heart disease of native coronary artery without angina pectoris: Secondary | ICD-10-CM | POA: Diagnosis not present

## 2021-07-27 DIAGNOSIS — R0902 Hypoxemia: Secondary | ICD-10-CM | POA: Diagnosis not present

## 2021-07-27 DIAGNOSIS — I129 Hypertensive chronic kidney disease with stage 1 through stage 4 chronic kidney disease, or unspecified chronic kidney disease: Secondary | ICD-10-CM | POA: Diagnosis not present

## 2021-07-27 DIAGNOSIS — Z6833 Body mass index (BMI) 33.0-33.9, adult: Secondary | ICD-10-CM | POA: Diagnosis not present

## 2021-07-27 DIAGNOSIS — E1122 Type 2 diabetes mellitus with diabetic chronic kidney disease: Secondary | ICD-10-CM | POA: Diagnosis not present

## 2021-07-27 DIAGNOSIS — N200 Calculus of kidney: Secondary | ICD-10-CM | POA: Diagnosis not present

## 2021-07-27 DIAGNOSIS — Z9181 History of falling: Secondary | ICD-10-CM | POA: Diagnosis not present

## 2021-07-27 DIAGNOSIS — E669 Obesity, unspecified: Secondary | ICD-10-CM | POA: Diagnosis not present

## 2021-07-28 DIAGNOSIS — R2681 Unsteadiness on feet: Secondary | ICD-10-CM | POA: Diagnosis not present

## 2021-08-03 DIAGNOSIS — R0902 Hypoxemia: Secondary | ICD-10-CM | POA: Diagnosis not present

## 2021-08-03 DIAGNOSIS — J432 Centrilobular emphysema: Secondary | ICD-10-CM | POA: Diagnosis not present

## 2021-08-03 DIAGNOSIS — E669 Obesity, unspecified: Secondary | ICD-10-CM | POA: Diagnosis not present

## 2021-08-03 DIAGNOSIS — I700 Atherosclerosis of aorta: Secondary | ICD-10-CM | POA: Diagnosis not present

## 2021-08-03 DIAGNOSIS — N200 Calculus of kidney: Secondary | ICD-10-CM | POA: Diagnosis not present

## 2021-08-03 DIAGNOSIS — Z6833 Body mass index (BMI) 33.0-33.9, adult: Secondary | ICD-10-CM | POA: Diagnosis not present

## 2021-08-03 DIAGNOSIS — C9000 Multiple myeloma not having achieved remission: Secondary | ICD-10-CM | POA: Diagnosis not present

## 2021-08-03 DIAGNOSIS — I129 Hypertensive chronic kidney disease with stage 1 through stage 4 chronic kidney disease, or unspecified chronic kidney disease: Secondary | ICD-10-CM | POA: Diagnosis not present

## 2021-08-03 DIAGNOSIS — Z9181 History of falling: Secondary | ICD-10-CM | POA: Diagnosis not present

## 2021-08-03 DIAGNOSIS — N182 Chronic kidney disease, stage 2 (mild): Secondary | ICD-10-CM | POA: Diagnosis not present

## 2021-08-03 DIAGNOSIS — I2510 Atherosclerotic heart disease of native coronary artery without angina pectoris: Secondary | ICD-10-CM | POA: Diagnosis not present

## 2021-08-03 DIAGNOSIS — E1122 Type 2 diabetes mellitus with diabetic chronic kidney disease: Secondary | ICD-10-CM | POA: Diagnosis not present

## 2021-08-07 DIAGNOSIS — I2510 Atherosclerotic heart disease of native coronary artery without angina pectoris: Secondary | ICD-10-CM | POA: Diagnosis not present

## 2021-08-07 DIAGNOSIS — J432 Centrilobular emphysema: Secondary | ICD-10-CM | POA: Diagnosis not present

## 2021-08-07 DIAGNOSIS — I700 Atherosclerosis of aorta: Secondary | ICD-10-CM | POA: Diagnosis not present

## 2021-08-07 DIAGNOSIS — E669 Obesity, unspecified: Secondary | ICD-10-CM | POA: Diagnosis not present

## 2021-08-07 DIAGNOSIS — C9000 Multiple myeloma not having achieved remission: Secondary | ICD-10-CM | POA: Diagnosis not present

## 2021-08-07 DIAGNOSIS — R0902 Hypoxemia: Secondary | ICD-10-CM | POA: Diagnosis not present

## 2021-08-07 DIAGNOSIS — N182 Chronic kidney disease, stage 2 (mild): Secondary | ICD-10-CM | POA: Diagnosis not present

## 2021-08-07 DIAGNOSIS — Z9181 History of falling: Secondary | ICD-10-CM | POA: Diagnosis not present

## 2021-08-07 DIAGNOSIS — I129 Hypertensive chronic kidney disease with stage 1 through stage 4 chronic kidney disease, or unspecified chronic kidney disease: Secondary | ICD-10-CM | POA: Diagnosis not present

## 2021-08-07 DIAGNOSIS — E1122 Type 2 diabetes mellitus with diabetic chronic kidney disease: Secondary | ICD-10-CM | POA: Diagnosis not present

## 2021-08-07 DIAGNOSIS — Z6833 Body mass index (BMI) 33.0-33.9, adult: Secondary | ICD-10-CM | POA: Diagnosis not present

## 2021-08-07 DIAGNOSIS — N200 Calculus of kidney: Secondary | ICD-10-CM | POA: Diagnosis not present

## 2021-08-13 DIAGNOSIS — R0902 Hypoxemia: Secondary | ICD-10-CM | POA: Diagnosis not present

## 2021-08-13 DIAGNOSIS — I2510 Atherosclerotic heart disease of native coronary artery without angina pectoris: Secondary | ICD-10-CM | POA: Diagnosis not present

## 2021-08-13 DIAGNOSIS — C9000 Multiple myeloma not having achieved remission: Secondary | ICD-10-CM | POA: Diagnosis not present

## 2021-08-13 DIAGNOSIS — E1122 Type 2 diabetes mellitus with diabetic chronic kidney disease: Secondary | ICD-10-CM | POA: Diagnosis not present

## 2021-08-13 DIAGNOSIS — N182 Chronic kidney disease, stage 2 (mild): Secondary | ICD-10-CM | POA: Diagnosis not present

## 2021-08-13 DIAGNOSIS — I129 Hypertensive chronic kidney disease with stage 1 through stage 4 chronic kidney disease, or unspecified chronic kidney disease: Secondary | ICD-10-CM | POA: Diagnosis not present

## 2021-08-13 DIAGNOSIS — J432 Centrilobular emphysema: Secondary | ICD-10-CM | POA: Diagnosis not present

## 2021-08-17 DIAGNOSIS — E1122 Type 2 diabetes mellitus with diabetic chronic kidney disease: Secondary | ICD-10-CM | POA: Diagnosis not present

## 2021-08-17 DIAGNOSIS — J432 Centrilobular emphysema: Secondary | ICD-10-CM | POA: Diagnosis not present

## 2021-08-17 DIAGNOSIS — N200 Calculus of kidney: Secondary | ICD-10-CM | POA: Diagnosis not present

## 2021-08-17 DIAGNOSIS — I700 Atherosclerosis of aorta: Secondary | ICD-10-CM | POA: Diagnosis not present

## 2021-08-17 DIAGNOSIS — E669 Obesity, unspecified: Secondary | ICD-10-CM | POA: Diagnosis not present

## 2021-08-17 DIAGNOSIS — I129 Hypertensive chronic kidney disease with stage 1 through stage 4 chronic kidney disease, or unspecified chronic kidney disease: Secondary | ICD-10-CM | POA: Diagnosis not present

## 2021-08-17 DIAGNOSIS — R0902 Hypoxemia: Secondary | ICD-10-CM | POA: Diagnosis not present

## 2021-08-17 DIAGNOSIS — C9000 Multiple myeloma not having achieved remission: Secondary | ICD-10-CM | POA: Diagnosis not present

## 2021-08-17 DIAGNOSIS — Z6833 Body mass index (BMI) 33.0-33.9, adult: Secondary | ICD-10-CM | POA: Diagnosis not present

## 2021-08-17 DIAGNOSIS — Z9181 History of falling: Secondary | ICD-10-CM | POA: Diagnosis not present

## 2021-08-17 DIAGNOSIS — I2510 Atherosclerotic heart disease of native coronary artery without angina pectoris: Secondary | ICD-10-CM | POA: Diagnosis not present

## 2021-08-17 DIAGNOSIS — N182 Chronic kidney disease, stage 2 (mild): Secondary | ICD-10-CM | POA: Diagnosis not present

## 2021-08-21 DIAGNOSIS — Z6833 Body mass index (BMI) 33.0-33.9, adult: Secondary | ICD-10-CM | POA: Diagnosis not present

## 2021-08-21 DIAGNOSIS — E669 Obesity, unspecified: Secondary | ICD-10-CM | POA: Diagnosis not present

## 2021-08-21 DIAGNOSIS — I2510 Atherosclerotic heart disease of native coronary artery without angina pectoris: Secondary | ICD-10-CM | POA: Diagnosis not present

## 2021-08-21 DIAGNOSIS — C9000 Multiple myeloma not having achieved remission: Secondary | ICD-10-CM | POA: Diagnosis not present

## 2021-08-21 DIAGNOSIS — I129 Hypertensive chronic kidney disease with stage 1 through stage 4 chronic kidney disease, or unspecified chronic kidney disease: Secondary | ICD-10-CM | POA: Diagnosis not present

## 2021-08-21 DIAGNOSIS — J432 Centrilobular emphysema: Secondary | ICD-10-CM | POA: Diagnosis not present

## 2021-08-21 DIAGNOSIS — N200 Calculus of kidney: Secondary | ICD-10-CM | POA: Diagnosis not present

## 2021-08-21 DIAGNOSIS — Z9181 History of falling: Secondary | ICD-10-CM | POA: Diagnosis not present

## 2021-08-21 DIAGNOSIS — R0902 Hypoxemia: Secondary | ICD-10-CM | POA: Diagnosis not present

## 2021-08-21 DIAGNOSIS — I700 Atherosclerosis of aorta: Secondary | ICD-10-CM | POA: Diagnosis not present

## 2021-08-21 DIAGNOSIS — E1122 Type 2 diabetes mellitus with diabetic chronic kidney disease: Secondary | ICD-10-CM | POA: Diagnosis not present

## 2021-08-21 DIAGNOSIS — N182 Chronic kidney disease, stage 2 (mild): Secondary | ICD-10-CM | POA: Diagnosis not present

## 2021-08-28 DIAGNOSIS — N182 Chronic kidney disease, stage 2 (mild): Secondary | ICD-10-CM | POA: Diagnosis not present

## 2021-08-28 DIAGNOSIS — C9000 Multiple myeloma not having achieved remission: Secondary | ICD-10-CM | POA: Diagnosis not present

## 2021-08-28 DIAGNOSIS — R0902 Hypoxemia: Secondary | ICD-10-CM | POA: Diagnosis not present

## 2021-08-28 DIAGNOSIS — J432 Centrilobular emphysema: Secondary | ICD-10-CM | POA: Diagnosis not present

## 2021-08-28 DIAGNOSIS — I700 Atherosclerosis of aorta: Secondary | ICD-10-CM | POA: Diagnosis not present

## 2021-08-28 DIAGNOSIS — Z6833 Body mass index (BMI) 33.0-33.9, adult: Secondary | ICD-10-CM | POA: Diagnosis not present

## 2021-08-28 DIAGNOSIS — Z9181 History of falling: Secondary | ICD-10-CM | POA: Diagnosis not present

## 2021-08-28 DIAGNOSIS — R2681 Unsteadiness on feet: Secondary | ICD-10-CM | POA: Diagnosis not present

## 2021-08-28 DIAGNOSIS — E669 Obesity, unspecified: Secondary | ICD-10-CM | POA: Diagnosis not present

## 2021-08-28 DIAGNOSIS — I129 Hypertensive chronic kidney disease with stage 1 through stage 4 chronic kidney disease, or unspecified chronic kidney disease: Secondary | ICD-10-CM | POA: Diagnosis not present

## 2021-08-28 DIAGNOSIS — N200 Calculus of kidney: Secondary | ICD-10-CM | POA: Diagnosis not present

## 2021-08-28 DIAGNOSIS — I2510 Atherosclerotic heart disease of native coronary artery without angina pectoris: Secondary | ICD-10-CM | POA: Diagnosis not present

## 2021-08-28 DIAGNOSIS — E1122 Type 2 diabetes mellitus with diabetic chronic kidney disease: Secondary | ICD-10-CM | POA: Diagnosis not present

## 2021-08-31 DIAGNOSIS — H401111 Primary open-angle glaucoma, right eye, mild stage: Secondary | ICD-10-CM | POA: Diagnosis not present

## 2021-09-03 ENCOUNTER — Ambulatory Visit (INDEPENDENT_AMBULATORY_CARE_PROVIDER_SITE_OTHER): Payer: Medicare HMO | Admitting: Vascular Surgery

## 2021-09-03 ENCOUNTER — Ambulatory Visit (INDEPENDENT_AMBULATORY_CARE_PROVIDER_SITE_OTHER): Payer: Medicare HMO

## 2021-09-03 ENCOUNTER — Encounter (INDEPENDENT_AMBULATORY_CARE_PROVIDER_SITE_OTHER): Payer: Self-pay | Admitting: Vascular Surgery

## 2021-09-03 VITALS — BP 122/73 | HR 65 | Resp 16 | Wt 208.8 lb

## 2021-09-03 DIAGNOSIS — I10 Essential (primary) hypertension: Secondary | ICD-10-CM | POA: Diagnosis not present

## 2021-09-03 DIAGNOSIS — I714 Abdominal aortic aneurysm, without rupture, unspecified: Secondary | ICD-10-CM

## 2021-09-03 DIAGNOSIS — K219 Gastro-esophageal reflux disease without esophagitis: Secondary | ICD-10-CM

## 2021-09-03 DIAGNOSIS — J441 Chronic obstructive pulmonary disease with (acute) exacerbation: Secondary | ICD-10-CM | POA: Diagnosis not present

## 2021-09-03 DIAGNOSIS — I7143 Infrarenal abdominal aortic aneurysm, without rupture: Secondary | ICD-10-CM

## 2021-09-03 DIAGNOSIS — E1159 Type 2 diabetes mellitus with other circulatory complications: Secondary | ICD-10-CM | POA: Diagnosis not present

## 2021-09-03 DIAGNOSIS — I70213 Atherosclerosis of native arteries of extremities with intermittent claudication, bilateral legs: Secondary | ICD-10-CM
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
## 2021-09-07 DIAGNOSIS — Z9181 History of falling: Secondary | ICD-10-CM | POA: Diagnosis not present

## 2021-09-07 DIAGNOSIS — N182 Chronic kidney disease, stage 2 (mild): Secondary | ICD-10-CM | POA: Diagnosis not present

## 2021-09-07 DIAGNOSIS — I2510 Atherosclerotic heart disease of native coronary artery without angina pectoris: Secondary | ICD-10-CM | POA: Diagnosis not present

## 2021-09-07 DIAGNOSIS — E669 Obesity, unspecified: Secondary | ICD-10-CM | POA: Diagnosis not present

## 2021-09-07 DIAGNOSIS — R0902 Hypoxemia: Secondary | ICD-10-CM | POA: Diagnosis not present

## 2021-09-07 DIAGNOSIS — I700 Atherosclerosis of aorta: Secondary | ICD-10-CM | POA: Diagnosis not present

## 2021-09-07 DIAGNOSIS — Z6833 Body mass index (BMI) 33.0-33.9, adult: Secondary | ICD-10-CM | POA: Diagnosis not present

## 2021-09-07 DIAGNOSIS — I129 Hypertensive chronic kidney disease with stage 1 through stage 4 chronic kidney disease, or unspecified chronic kidney disease: Secondary | ICD-10-CM | POA: Diagnosis not present

## 2021-09-07 DIAGNOSIS — C9000 Multiple myeloma not having achieved remission: Secondary | ICD-10-CM | POA: Diagnosis not present

## 2021-09-07 DIAGNOSIS — E1122 Type 2 diabetes mellitus with diabetic chronic kidney disease: Secondary | ICD-10-CM | POA: Diagnosis not present

## 2021-09-07 DIAGNOSIS — J432 Centrilobular emphysema: Secondary | ICD-10-CM | POA: Diagnosis not present

## 2021-09-07 DIAGNOSIS — N200 Calculus of kidney: Secondary | ICD-10-CM | POA: Diagnosis not present

## 2021-12-18 ENCOUNTER — Encounter: Payer: Self-pay | Admitting: Family Medicine

## 2022-01-05 ENCOUNTER — Other Ambulatory Visit: Payer: Self-pay | Admitting: Family Medicine

## 2022-01-06 ENCOUNTER — Other Ambulatory Visit: Payer: Self-pay | Admitting: Family Medicine

## 2022-01-06 DIAGNOSIS — E039 Hypothyroidism, unspecified: Secondary | ICD-10-CM

## 2022-01-09 ENCOUNTER — Encounter: Payer: Self-pay | Admitting: Family Medicine

## 2022-01-09 ENCOUNTER — Ambulatory Visit (INDEPENDENT_AMBULATORY_CARE_PROVIDER_SITE_OTHER): Payer: Medicare HMO | Admitting: Family Medicine

## 2022-01-09 VITALS — BP 107/65 | HR 69 | Temp 97.5°F | Resp 24 | Wt 218.0 lb

## 2022-01-09 DIAGNOSIS — E785 Hyperlipidemia, unspecified: Secondary | ICD-10-CM | POA: Diagnosis not present

## 2022-01-09 DIAGNOSIS — I10 Essential (primary) hypertension: Secondary | ICD-10-CM | POA: Diagnosis not present

## 2022-01-09 DIAGNOSIS — J441 Chronic obstructive pulmonary disease with (acute) exacerbation: Secondary | ICD-10-CM

## 2022-01-09 DIAGNOSIS — R0609 Other forms of dyspnea: Secondary | ICD-10-CM

## 2022-01-09 DIAGNOSIS — R7303 Prediabetes: Secondary | ICD-10-CM | POA: Diagnosis not present

## 2022-01-09 DIAGNOSIS — E890 Postprocedural hypothyroidism: Secondary | ICD-10-CM

## 2022-01-09 DIAGNOSIS — D696 Thrombocytopenia, unspecified: Secondary | ICD-10-CM | POA: Diagnosis not present

## 2022-01-09 NOTE — Progress Notes (Signed)
I,[name],acting as a scribe for [name], MD.,have documented all relevant documentation on the behalf of [name], MD,as directed by  [name], MD while in the presence of [name], MD.    Established patient visit   Patient: Ryan Ali   DOB: 01-Dec-1940   81 y.o. Male  MRN: [id number] Visit Date: 01/09/2022  Today's healthcare provider: [name], MD   Chief Complaint  Patient presents with   Hypothyroidism   Hyperlipidemia   Subjective    HPI  Hypothyroid, follow-up  Lab Results  Component Value Date   TSH 0.383 (L) 05/25/2021   TSH 12.800 (H) 04/10/2021   TSH 0.718 01/17/2021   FREET4 1.57 05/25/2021   FREET4 2.11 (H) 01/17/2021   T4TOTAL 6.0 04/10/2021   T4TOTAL 9.1 07/17/2015    Wt Readings from Last 3 Encounters:  01/09/22 218 lb (98.9 kg)  09/03/21 208 lb 12.8 oz (94.7 kg)  04/19/21 209 lb 8 oz (95 kg)    He was last seen for hypothyroid 9 months ago.  Management since that visit includes changing the dose of levothyroxine. Thyroid levels were rechecked in March 2023. Patient was advised to continue same dose of medication. He reports good compliance with treatment. He is not having side effects.   Symptoms: No change in energy level No constipation  No diarrhea No heat / cold intolerance  No nervousness No palpitations  No weight changes    -----------------------------------------------------------------------------------------   Lipid/Cholesterol, Follow-up  Last lipid panel Other pertinent labs  Lab Results  Component Value Date   CHOL 142 01/17/2021   HDL 31 (L) 01/17/2021   LDLCALC 91 01/17/2021   TRIG 106 01/17/2021   CHOLHDL 4.6 01/17/2021   Lab Results  Component Value Date   ALT 13 03/23/2021   AST 33 03/23/2021   PLT 187 03/29/2021   TSH 0.383 (L) 05/25/2021     He was last seen for this 1  year  ago.  Management since that visit includes continue same medication.  He reports  good compliance with treatment. He is not having side effects.   Symptoms: No chest pain No chest pressure/discomfort  Yes dyspnea Yes lower extremity edema (in feet and ankles)  Yes numbness or tingling of extremity (in left hand) No orthopnea  No palpitations No paroxysmal nocturnal dyspnea  No speech difficulty No syncope   Current diet: in general, an "unhealthy" diet Current exercise: none  The ASCVD Risk score (Arnett DK, et al., 2019) failed to calculate for the following reasons:   The 2019 ASCVD risk score is only valid for ages 36 to 2  ---------------------------------------------------------------------------------------------------  He is using inhaler consistently and gets somewhat short of breath with exertion. No chest pains or palpations.   Is also due to check A1c for prediabetes.  Medications: Outpatient Medications Prior to Visit  Medication Sig   albuterol (VENTOLIN HFA) 108 (90 Base) MCG/ACT inhaler Inhale 2 puffs into the lungs every 6 (six) hours as needed for shortness of breath or wheezing.   Ascorbic Acid (VITAMIN C PO) Take 1,400 mg by mouth daily.   aspirin EC 81 MG tablet Take 81 mg by mouth daily. Swallow whole.   ASTAXANTHIN PO Take 12 mg by mouth daily.   atenolol (TENORMIN) 25 MG tablet TAKE 1 TABLET(25 MG) BY MOUTH EVERY MORNING   atorvastatin (LIPITOR) 20 MG tablet TAKE 1 TABLET BY MOUTH EVERY DAY   brimonidine (ALPHAGAN) 0.2 % ophthalmic solution Place 1  drop into the right eye in the morning and at bedtime.   budesonide-formoterol (SYMBICORT) 160-4.5 MCG/ACT inhaler Inhale 2 puffs into the lungs 2 (two) times daily.   Calcium Carbonate-Vit D-Min (CALCIUM 600+D PLUS MINERALS) 600-400 MG-UNIT CHEW Chew 3 tablets by mouth daily.   Cholecalciferol (VITAMIN D PO) Take 1 tablet by mouth daily. 2000 mg   clopidogrel (PLAVIX) 75 MG tablet Take 1 tablet (75 mg total) by mouth daily at 6 (six) AM.   Elastic Bandages & Supports (MEDICAL COMPRESSION SOCKS)  MISC Put on compression socks in the AM as soon as you wake up   levothyroxine (SYNTHROID) 150 MCG tablet TAKE 1 TABLET BY MOUTH EVERY DAY   magic mouthwash (nystatin, lidocaine, diphenhydrAMINE, alum & mag hydroxide) suspension Swish and spit 5 mLs 3 (three) times daily.   omeprazole (PRILOSEC) 40 MG capsule Take 40 mg by mouth daily.   pantoprazole (PROTONIX) 40 MG tablet Take 40 mg by mouth daily.   Pregabalin (LYRICA PO) Take by mouth 2 (two) times daily.   tamsulosin (FLOMAX) 0.4 MG CAPS capsule Take 1 capsule (0.4 mg total) by mouth daily.   Tiotropium Bromide Monohydrate 1.25 MCG/ACT AERS INHALE 2 INHALATIONS ORAL INHALATION EVERY MORNING *REPLACES TIOTROPIUM HANDIHALER*   No facility-administered medications prior to visit.    Review of Systems  Constitutional:  Negative for appetite change, chills and fever.  Respiratory:  Positive for shortness of breath. Negative for chest tightness and wheezing.   Cardiovascular:  Positive for leg swelling. Negative for chest pain and palpitations.  Gastrointestinal:  Negative for abdominal pain, nausea and vomiting.  Musculoskeletal:  Positive for joint swelling.  Neurological:  Positive for numbness (in left hand).       Objective    BP 107/65 (BP Location: Left Arm, Patient Position: Sitting, Cuff Size: Large)   Pulse 69   Temp (!) 97.5 F (36.4 C) (Oral)   Resp (!) 24   Wt 218 lb (98.9 kg)   SpO2 97% Comment: room air  BMI 36.28 kg/m    Today's Vitals   01/09/22 1057 01/09/22 1101  BP: (!) 100/59 107/65  Pulse: 69 69  Resp: (!) 24   Temp: (!) 97.5 F (36.4 C)   TempSrc: Oral   SpO2: 97%   Weight: 218 lb (98.9 kg)    Body mass index is 36.28 kg/m.   Physical Exam   General: Appearance:    Obese male in no acute distress  Eyes:    PERRL, conjunctiva/corneas clear, EOM's intact       Lungs:     Clear to auscultation bilaterally, respirations unlabored  Heart:    Normal heart rate. Normal rhythm. No murmurs, rubs, or  gallops.    MS:   All extremities are intact.  2+ bipedal and lower leg edema.   Neurologic:   Awake, alert, oriented x 3. No apparent focal neurological defect.         Assessment & Plan     1. Hypothyroidism, postop  - TSH - T4, free  2. Hyperlipidemia, unspecified hyperlipidemia type He is tolerating atorvastatin well with no adverse effects.   - Comprehensive metabolic panel - Lipid panel  3. Thrombocytopenia (HCC)  - CBC  4. Prediabetes  - Hemoglobin A1c  5. Dyspnea on exertion He had echo with grade I diastolic dysfunction in December of last year.  - Brain natriuretic peptide  6. Chronic obstructive pulmonary disease with acute exacerbation (HCC) Stable on current inhalers managed by Dr. Lanney Gins.  7. Primary hypertension He reports  feeling a bit dizzy when he stands up quickly and BP noted to be a little low today. Consider reducing atenolol after reviewing labs.   He reports that he continues regular follow ups at [facility] 2-3 times a year.      The entirety of the information documented in the History of Present Illness, Review of Systems and Physical Exam were personally obtained by me. Portions of this information were initially documented by the CMA and reviewed by me for thoroughness and accuracy.     [name], MD  [facility] [phone] (phone) [phone] (fax)  [place]

## 2022-01-11 LAB — COMPREHENSIVE METABOLIC PANEL
ALT: 16 IU/L (ref 0–44)
AST: 22 IU/L (ref 0–40)
Albumin/Globulin Ratio: 1.9 (ref 1.2–2.2)
Albumin: 4.1 g/dL (ref 3.7–4.7)
Alkaline Phosphatase: 100 IU/L (ref 44–121)
BUN/Creatinine Ratio: 17 (ref 10–24)
BUN: 23 mg/dL (ref 8–27)
Bilirubin Total: 1.1 mg/dL (ref 0.0–1.2)
CO2: 23 mmol/L (ref 20–29)
Calcium: 9.1 mg/dL (ref 8.6–10.2)
Chloride: 105 mmol/L (ref 96–106)
Creatinine, Ser: 1.33 mg/dL — ABNORMAL HIGH (ref 0.76–1.27)
Globulin, Total: 2.2 g/dL (ref 1.5–4.5)
Glucose: 117 mg/dL — ABNORMAL HIGH (ref 70–99)
Potassium: 4.5 mmol/L (ref 3.5–5.2)
Sodium: 140 mmol/L (ref 134–144)
Total Protein: 6.3 g/dL (ref 6.0–8.5)
eGFR: 53 mL/min/{1.73_m2} — ABNORMAL LOW (ref >59)

## 2022-01-11 LAB — LIPID PANEL
Chol/HDL Ratio: 2.3 ratio (ref 0.0–5.0)
Cholesterol, Total: 105 mg/dL (ref 100–199)
HDL: 45 mg/dL (ref >39)
LDL Chol Calc (NIH): 46 mg/dL (ref 0–99)
Triglycerides: 66 mg/dL (ref 0–149)
VLDL Cholesterol Cal: 14 mg/dL (ref 5–40)

## 2022-01-11 LAB — HEMOGLOBIN A1C
Est. average glucose Bld gHb Est-mCnc: 131 mg/dL
Hgb A1c MFr Bld: 6.2 % — ABNORMAL HIGH (ref 4.8–5.6)

## 2022-01-11 LAB — CBC
Hematocrit: 43 % (ref 37.5–51.0)
Hemoglobin: 14.5 g/dL (ref 13.0–17.7)
MCH: 32.7 pg (ref 26.6–33.0)
MCHC: 33.7 g/dL (ref 31.5–35.7)
MCV: 97 fL (ref 79–97)
Platelets: 142 10*3/uL — ABNORMAL LOW (ref 150–450)
RBC: 4.43 x10E6/uL (ref 4.14–5.80)
RDW: 13.5 % (ref 11.6–15.4)
WBC: 7.3 10*3/uL (ref 3.4–10.8)

## 2022-01-11 LAB — BRAIN NATRIURETIC PEPTIDE: BNP: 128 pg/mL — ABNORMAL HIGH (ref 0.0–100.0)

## 2022-01-11 LAB — TSH: TSH: 2.73 u[IU]/mL (ref 0.450–4.500)

## 2022-01-11 LAB — T4, FREE: Free T4: 1.51 ng/dL (ref 0.82–1.77)

## 2022-01-14 ENCOUNTER — Telehealth: Payer: Self-pay

## 2022-01-14 MED ORDER — FUROSEMIDE 20 MG PO TABS: 20.0000 mg | ORAL_TABLET | ORAL | 0 refills | Status: DC

## 2022-01-14 NOTE — Telephone Encounter (Signed)
-----   Message from Birdie Sons, MD sent at 01/14/2022  1:09 PM EDT ----- A1c is stable at 6.2% BNP is a little elevated indicates slight degree of heart failure.  Rest of labs are good.  Recommend starting furosemide '20mg'$  once every other day and schedule follow up 2-3 weeks. Can send in prescription for #30 no refills.

## 2022-01-14 NOTE — Telephone Encounter (Signed)
Patient advised as below. He will call back to schedule fu.

## 2022-01-18 ENCOUNTER — Encounter: Payer: Medicare HMO | Admitting: Family Medicine

## 2022-01-21 ENCOUNTER — Encounter (INDEPENDENT_AMBULATORY_CARE_PROVIDER_SITE_OTHER): Payer: Self-pay

## 2022-02-12 ENCOUNTER — Ambulatory Visit (INDEPENDENT_AMBULATORY_CARE_PROVIDER_SITE_OTHER): Payer: Medicare HMO

## 2022-02-12 VITALS — Ht 65.0 in | Wt 218.0 lb

## 2022-02-12 DIAGNOSIS — G9009 Other idiopathic peripheral autonomic neuropathy: Secondary | ICD-10-CM | POA: Insufficient documentation

## 2022-02-12 DIAGNOSIS — Z0000 Encounter for general adult medical examination without abnormal findings: Secondary | ICD-10-CM

## 2022-02-12 DIAGNOSIS — N209 Urinary calculus, unspecified: Secondary | ICD-10-CM | POA: Insufficient documentation

## 2022-02-12 DIAGNOSIS — Z659 Problem related to unspecified psychosocial circumstances: Secondary | ICD-10-CM | POA: Insufficient documentation

## 2022-02-12 DIAGNOSIS — N210 Calculus in bladder: Secondary | ICD-10-CM | POA: Insufficient documentation

## 2022-02-12 DIAGNOSIS — R269 Unspecified abnormalities of gait and mobility: Secondary | ICD-10-CM | POA: Insufficient documentation

## 2022-02-12 DIAGNOSIS — J449 Chronic obstructive pulmonary disease, unspecified: Secondary | ICD-10-CM | POA: Insufficient documentation

## 2022-02-12 DIAGNOSIS — N201 Calculus of ureter: Secondary | ICD-10-CM | POA: Insufficient documentation

## 2022-02-12 DIAGNOSIS — R0602 Shortness of breath: Secondary | ICD-10-CM | POA: Insufficient documentation

## 2022-02-12 DIAGNOSIS — Z719 Counseling, unspecified: Secondary | ICD-10-CM | POA: Insufficient documentation

## 2022-02-12 NOTE — Patient Instructions (Signed)
Ryan Ali , Thank you for taking time to come for your Medicare Wellness Visit. I appreciate your ongoing commitment to your health goals. Please review the following plan we discussed and let me know if I can assist you in the future.   Screening recommendations/referrals: Colonoscopy: aged out Recommended yearly ophthalmology/optometry visit for glaucoma screening and checkup Recommended yearly dental visit for hygiene and checkup  Vaccinations: Influenza vaccine: 01/03/21 Pneumococcal vaccine: 0/21/09 Tdap vaccine: 12/04/10, due if have injury Shingles vaccine: n/d   Covid-19: 05/20/19, 06/10/19, 12/24/21  Advanced directives: no  Conditions/risks identified: none  Next appointment: Follow up in one year for your annual wellness visit. 02/17/23 @ 10:15 am by phone  Preventive Care 65 Years and Older, Male Preventive care refers to lifestyle choices and visits with your health care provider that can promote health and wellness. What does preventive care include? A yearly physical exam. This is also called an annual well check. Dental exams once or twice a year. Routine eye exams. Ask your health care provider how often you should have your eyes checked. Personal lifestyle choices, including: Daily care of your teeth and gums. Regular physical activity. Eating a healthy diet. Avoiding tobacco and drug use. Limiting alcohol use. Practicing safe sex. Taking low doses of aspirin every day. Taking vitamin and mineral supplements as recommended by your health care provider. What happens during an annual well check? The services and screenings done by your health care provider during your annual well check will depend on your age, overall health, lifestyle risk factors, and family history of disease. Counseling  Your health care provider may ask you questions about your: Alcohol use. Tobacco use. Drug use. Emotional well-being. Home and relationship well-being. Sexual  activity. Eating habits. History of falls. Memory and ability to understand (cognition). Work and work Statistician. Screening  You may have the following tests or measurements: Height, weight, and BMI. Blood pressure. Lipid and cholesterol levels. These may be checked every 5 years, or more frequently if you are over 14 years old. Skin check. Lung cancer screening. You may have this screening every year starting at age 54 if you have a 30-pack-year history of smoking and currently smoke or have quit within the past 15 years. Fecal occult blood test (FOBT) of the stool. You may have this test every year starting at age 5. Flexible sigmoidoscopy or colonoscopy. You may have a sigmoidoscopy every 5 years or a colonoscopy every 10 years starting at age 70. Prostate cancer screening. Recommendations will vary depending on your family history and other risks. Hepatitis C blood test. Hepatitis B blood test. Sexually transmitted disease (STD) testing. Diabetes screening. This is done by checking your blood sugar (glucose) after you have not eaten for a while (fasting). You may have this done every 1-3 years. Abdominal aortic aneurysm (AAA) screening. You may need this if you are a current or former smoker. Osteoporosis. You may be screened starting at age 27 if you are at high risk. Talk with your health care provider about your test results, treatment options, and if necessary, the need for more tests. Vaccines  Your health care provider may recommend certain vaccines, such as: Influenza vaccine. This is recommended every year. Tetanus, diphtheria, and acellular pertussis (Tdap, Td) vaccine. You may need a Td booster every 10 years. Zoster vaccine. You may need this after age 32. Pneumococcal 13-valent conjugate (PCV13) vaccine. One dose is recommended after age 42. Pneumococcal polysaccharide (PPSV23) vaccine. One dose is recommended after age 91.  Talk to your health care provider about which  screenings and vaccines you need and how often you need them. This information is not intended to replace advice given to you by your health care provider. Make sure you discuss any questions you have with your health care provider. Document Released: 04/07/2015 Document Revised: 11/29/2015 Document Reviewed: 01/10/2015 Elsevier Interactive Patient Education  2017 [place] Prevention in the Home Falls can cause injuries. They can happen to people of all ages. There are many things you can do to make your home safe and to help prevent falls. What can I do on the outside of my home? Regularly fix the edges of walkways and driveways and fix any cracks. Remove anything that might make you trip as you walk through a door, such as a raised step or threshold. Trim any bushes or trees on the path to your home. Use bright outdoor lighting. Clear any walking paths of anything that might make someone trip, such as rocks or tools. Regularly check to see if handrails are loose or broken. Make sure that both sides of any steps have handrails. Any raised decks and porches should have guardrails on the edges. Have any leaves, snow, or ice cleared regularly. Use sand or salt on walking paths during winter. Clean up any spills in your garage right away. This includes oil or grease spills. What can I do in the bathroom? Use night lights. Install grab bars by the toilet and in the tub and shower. Do not use towel bars as grab bars. Use non-skid mats or decals in the tub or shower. If you need to sit down in the shower, use a plastic, non-slip stool. Keep the floor dry. Clean up any water that spills on the floor as soon as it happens. Remove soap buildup in the tub or shower regularly. Attach bath mats securely with double-sided non-slip rug tape. Do not have throw rugs and other things on the floor that can make you trip. What can I do in the bedroom? Use night lights. Make sure that you have a  light by your bed that is easy to reach. Do not use any sheets or blankets that are too big for your bed. They should not hang down onto the floor. Have a firm chair that has side arms. You can use this for support while you get dressed. Do not have throw rugs and other things on the floor that can make you trip. What can I do in the kitchen? Clean up any spills right away. Avoid walking on wet floors. Keep items that you use a lot in easy-to-reach places. If you need to reach something above you, use a strong step stool that has a grab bar. Keep electrical cords out of the way. Do not use floor polish or wax that makes floors slippery. If you must use wax, use non-skid floor wax. Do not have throw rugs and other things on the floor that can make you trip. What can I do with my stairs? Do not leave any items on the stairs. Make sure that there are handrails on both sides of the stairs and use them. Fix handrails that are broken or loose. Make sure that handrails are as long as the stairways. Check any carpeting to make sure that it is firmly attached to the stairs. Fix any carpet that is loose or worn. Avoid having throw rugs at the top or bottom of the stairs. If you do have throw  rugs, attach them to the floor with carpet tape. Make sure that you have a light switch at the top of the stairs and the bottom of the stairs. If you do not have them, ask someone to add them for you. What else can I do to help prevent falls? Wear shoes that: Do not have high heels. Have rubber bottoms. Are comfortable and fit you well. Are closed at the toe. Do not wear sandals. If you use a stepladder: Make sure that it is fully opened. Do not climb a closed stepladder. Make sure that both sides of the stepladder are locked into place. Ask someone to hold it for you, if possible. Clearly mark and make sure that you can see: Any grab bars or handrails. First and last steps. Where the edge of each step  is. Use tools that help you move around (mobility aids) if they are needed. These include: Canes. Walkers. Scooters. Crutches. Turn on the lights when you go into a dark area. Replace any light bulbs as soon as they burn out. Set up your furniture so you have a clear path. Avoid moving your furniture around. If any of your floors are uneven, fix them. If there are any pets around you, be aware of where they are. Review your medicines with your doctor. Some medicines can make you feel dizzy. This can increase your chance of falling. Ask your doctor what other things that you can do to help prevent falls. This information is not intended to replace advice given to you by your health care provider. Make sure you discuss any questions you have with your health care provider. Document Released: 01/05/2009 Document Revised: 08/17/2015 Document Reviewed: 04/15/2014 Elsevier Interactive Patient Education  2017 [employer].
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
## 2022-02-22 ENCOUNTER — Encounter: Payer: Self-pay | Admitting: Family Medicine

## 2022-02-22 ENCOUNTER — Ambulatory Visit (INDEPENDENT_AMBULATORY_CARE_PROVIDER_SITE_OTHER): Payer: Medicare HMO | Admitting: Family Medicine

## 2022-02-22 VITALS — BP 102/64 | HR 61 | Temp 98.1°F | Resp 18 | Ht 67.0 in

## 2022-02-22 DIAGNOSIS — R0609 Other forms of dyspnea: Secondary | ICD-10-CM

## 2022-02-22 DIAGNOSIS — R052 Subacute cough: Secondary | ICD-10-CM

## 2022-02-22 DIAGNOSIS — J069 Acute upper respiratory infection, unspecified: Secondary | ICD-10-CM

## 2022-02-22 DIAGNOSIS — Z0000 Encounter for general adult medical examination without abnormal findings: Secondary | ICD-10-CM | POA: Diagnosis not present

## 2022-02-22 DIAGNOSIS — M1990 Unspecified osteoarthritis, unspecified site: Secondary | ICD-10-CM | POA: Diagnosis not present

## 2022-02-22 MED ORDER — MONTELUKAST SODIUM 10 MG PO TABS: 10.0000 mg | ORAL_TABLET | Freq: Every day | ORAL | 3 refills | Status: DC

## 2022-02-22 MED ORDER — HYDROCODONE-ACETAMINOPHEN 5-325 MG PO TABS: 1.0000 | ORAL_TABLET | Freq: Four times a day (QID) | ORAL | 0 refills | Status: DC | PRN
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
## 2022-02-23 LAB — BRAIN NATRIURETIC PEPTIDE: BNP: 95.5 pg/mL (ref 0.0–100.0)

## 2022-02-25 ENCOUNTER — Encounter: Payer: Self-pay | Admitting: *Deleted

## 2022-03-10 ENCOUNTER — Other Ambulatory Visit: Payer: Self-pay | Admitting: Family Medicine

## 2022-03-11 DIAGNOSIS — H2512 Age-related nuclear cataract, left eye: Secondary | ICD-10-CM | POA: Diagnosis not present

## 2022-03-11 DIAGNOSIS — H353131 Nonexudative age-related macular degeneration, bilateral, early dry stage: Secondary | ICD-10-CM | POA: Diagnosis not present

## 2022-03-11 DIAGNOSIS — H40002 Preglaucoma, unspecified, left eye: Secondary | ICD-10-CM | POA: Diagnosis not present

## 2022-03-11 DIAGNOSIS — Z961 Presence of intraocular lens: Secondary | ICD-10-CM | POA: Diagnosis not present

## 2022-03-11 NOTE — Telephone Encounter (Signed)
Requested Prescriptions  Pending Prescriptions Disp Refills   furosemide (LASIX) 20 MG tablet [Pharmacy Med Name: FUROSEMIDE 20 MG TABLET] 45 tablet 0    Sig: TAKE 1 TABLET BY MOUTH EVERY OTHER DAY     Cardiovascular:  Diuretics - Loop Failed - 03/10/2022  8:31 AM      Failed - Cr in normal range and within 180 days    Creatinine  Date Value Ref Range Status  11/29/2011 1.29 0.60 - 1.30 mg/dL Final   Creatinine, Ser  Date Value Ref Range Status  01/09/2022 1.33 (H) 0.76 - 1.27 mg/dL Final   Creatinine, POC  Date Value Ref Range Status  05/12/2015 n/a mg/dL Final         Failed - Mg Level in normal range and within 180 days    Magnesium  Date Value Ref Range Status  03/29/2021 2.4 1.7 - 2.4 mg/dL Final    Comment:    Performed at [facility], [address] [place], [place] [address]         [place] in normal range and within 180 days    Potassium  Date Value Ref Range Status  01/09/2022 4.5 3.5 - 5.2 mmol/L Final  11/29/2011 4.2 3.5 - 5.1 mmol/L Final         Passed - Ca in normal range and within 180 days    Calcium  Date Value Ref Range Status  01/09/2022 9.1 8.6 - 10.2 mg/dL Final   Calcium, Total  Date Value Ref Range Status  11/29/2011 8.6 8.5 - 10.1 mg/dL Final         Passed - Na in normal range and within 180 days    Sodium  Date Value Ref Range Status  01/09/2022 140 134 - 144 mmol/L Final  11/29/2011 140 136 - 145 mmol/L Final         Passed - Cl in normal range and within 180 days    Chloride  Date Value Ref Range Status  01/09/2022 105 96 - 106 mmol/L Final  11/29/2011 105 98 - 107 mmol/L Final         Passed - Last BP in normal range    BP Readings from Last 1 Encounters:  02/22/22 102/64         Passed - Valid encounter within last 6 months    Recent Outpatient Visits           2 weeks ago Annual physical exam   [facility] Birdie Sons, MD   2 months ago Hypothyroidism, postop   [facility] Birdie Sons, MD   10 months ago Bilateral leg edema   [employer], Dani Gobble, PA-C   11 months ago Hypothyroidism, unspecified type   [facility] Myles Gip, DO   1 year ago Loss of appetite   [facility] Birdie Sons, MD

## 2022-03-19 ENCOUNTER — Other Ambulatory Visit: Payer: Self-pay | Admitting: Family Medicine

## 2022-03-19 DIAGNOSIS — N179 Acute kidney failure, unspecified: Secondary | ICD-10-CM

## 2022-03-21 NOTE — Telephone Encounter (Signed)
Requested medication (s) are due for refill today: yes  Requested medication (s) are on the active medication list: yes  Last refill:  04/09/21 #90 1 RF  Future visit scheduled: no  Notes to clinic:  overdue PSA   Requested Prescriptions  Pending Prescriptions Disp Refills   tamsulosin (FLOMAX) 0.4 MG CAPS capsule [Pharmacy Med Name: TAMSULOSIN HCL 0.4 MG CAPSULE] 90 capsule 1    Sig: TAKE 1 CAPSULE BY MOUTH [place]     Urology: Alpha-Adrenergic Blocker Failed - 03/19/2022  1:15 AM      Failed - PSA in normal range and within 360 days    Prostate Specific Ag, Serum  Date Value Ref Range Status  11/01/2019 0.3 0.0 - 4.0 ng/mL Final    Comment:    Roche ECLIA methodology. According to the American Urological Association, Serum PSA should decrease and remain at undetectable levels after radical prostatectomy. The AUA defines biochemical recurrence as an initial PSA value 0.2 ng/mL or greater followed by a subsequent confirmatory PSA value 0.2 ng/mL or greater. Values obtained with different assay methods or kits cannot be used interchangeably. Results cannot be interpreted as absolute evidence of the presence or absence of malignant disease.          Passed - Last BP in normal range    BP Readings from Last 1 Encounters:  02/22/22 102/64         Passed - Valid encounter within last 12 months    Recent Outpatient Visits           3 weeks ago Annual physical exam   [facility] Birdie Sons, MD   2 months ago Hypothyroidism, postop   [facility] Birdie Sons, MD   11 months ago Bilateral leg edema   [place], Dani Gobble, PA-C   11 months ago Hypothyroidism, unspecified type   [facility] Myles Gip, DO   1 year ago Loss of appetite   [facility] Birdie Sons, MD

## 2022-03-29 ENCOUNTER — Other Ambulatory Visit: Payer: Self-pay | Admitting: Family Medicine

## 2022-03-29 DIAGNOSIS — R000 Tachycardia, unspecified: Secondary | ICD-10-CM

## 2022-03-29 NOTE — Telephone Encounter (Signed)
Refilled 06/11/2021 #90 4 rf. Requested Prescriptions  Pending Prescriptions Disp Refills   atenolol (TENORMIN) 25 MG tablet [Pharmacy Med Name: ATENOLOL 25 MG TABLET] 90 tablet 3    Sig: TAKE 1 TABLET (25 MG TOTAL) BY MOUTH DAILY     Cardiovascular: Beta Blockers 2 Failed - 03/29/2022  3:43 PM      Failed - Cr in normal range and within 360 days    Creatinine  Date Value Ref Range Status  11/29/2011 1.29 0.60 - 1.30 mg/dL Final   Creatinine, Ser  Date Value Ref Range Status  01/09/2022 1.33 (H) 0.76 - 1.27 mg/dL Final   Creatinine, POC  Date Value Ref Range Status  05/12/2015 n/a mg/dL Final         Passed - Last BP in normal range    BP Readings from Last 1 Encounters:  02/22/22 102/64         Passed - Last Heart Rate in normal range    Pulse Readings from Last 1 Encounters:  02/22/22 61         Passed - Valid encounter within last 6 months    Recent Outpatient Visits           1 month ago Annual physical exam   [facility] Birdie Sons, MD   2 months ago Hypothyroidism, postop   [facility] Birdie Sons, MD   11 months ago Bilateral leg edema   [employer], Dani Gobble, PA-C   11 months ago Hypothyroidism, unspecified type   [place], [place], DO   1 year ago Loss of appetite   [facility] Birdie Sons, MD

## 2022-04-27 IMAGING — CT CT CTA ABD/PEL W/CM AND/OR W/O CM
2 of 7 series · 15 of 46 positions shown, 17 images · IV contrast (omnipaque)
Comparison: None.

CLINICAL DATA: Abdominal aortic aneurysm, preoperative planning

EXAM:
CTA ABDOMEN AND PELVIS WITHOUT AND WITH CONTRAST
TECHNIQUE: Multidetector CT imaging of the abdomen and pelvis was performed
using the standard protocol during bolus administration of
intravenous contrast. Multiplanar reconstructed images and MIPs were
obtained and reviewed to evaluate the vascular anatomy.
CONTRAST:  100mL OMNIPAQUE IOHEXOL 350 MG/ML SOLN

[Series 4: axial arterial · axial · arterial · 0.97mm/px · z∈[-976,-544]mm · 12 of 240 slices shown, 14 images]
[im 12/240  soft-tissue]
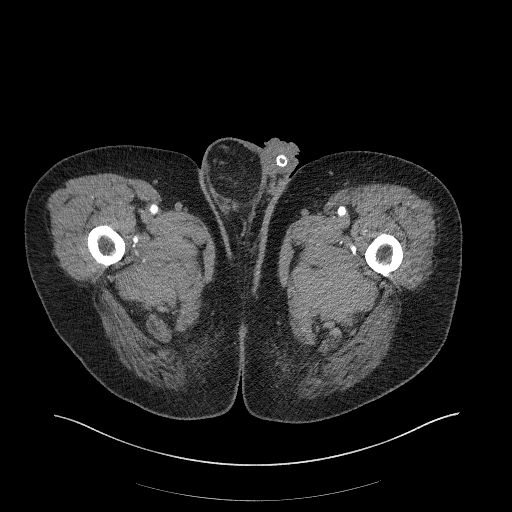
[im 12/240  bone]
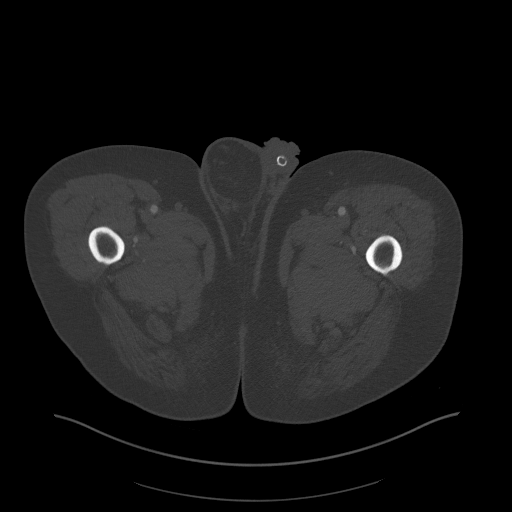
[im 35/240  soft-tissue]
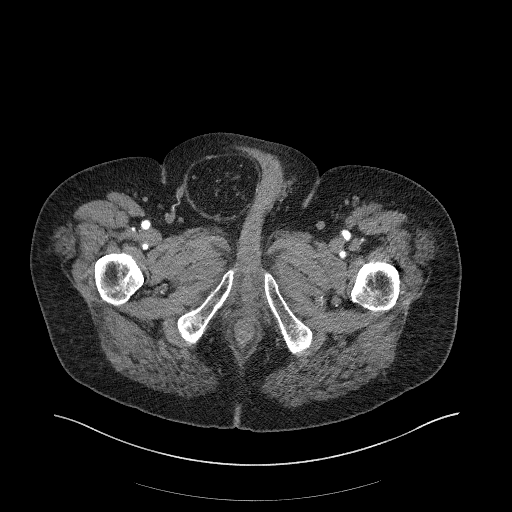
[im 57/240  soft-tissue]
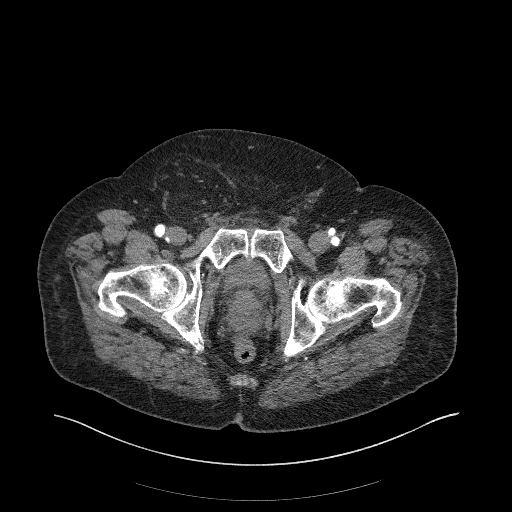
[im 69/240  soft-tissue]
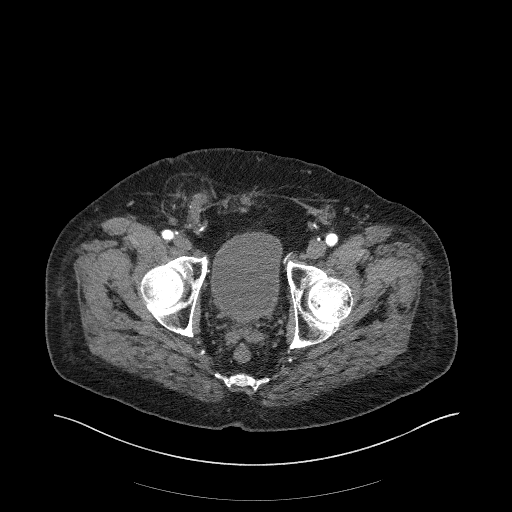
[im 92/240  soft-tissue]
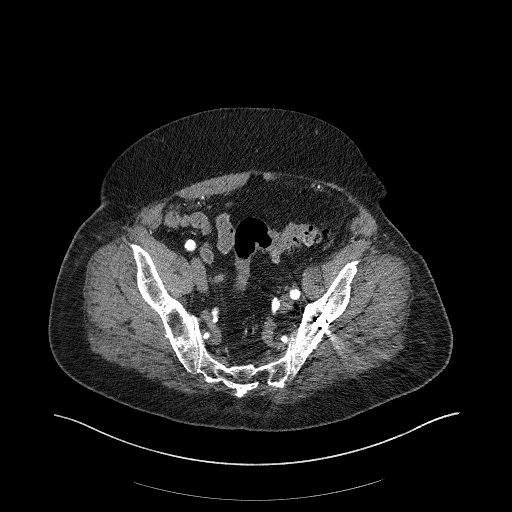
[im 114/240  soft-tissue]
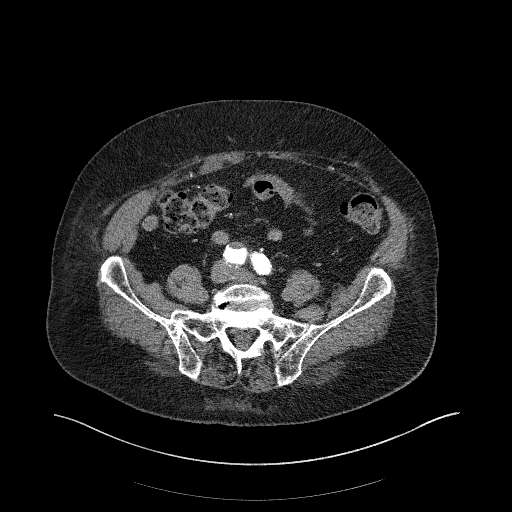
[im 126/240  soft-tissue]
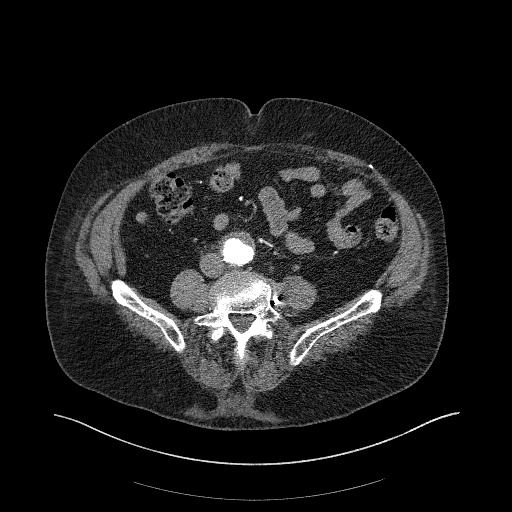
[im 148/240  soft-tissue]
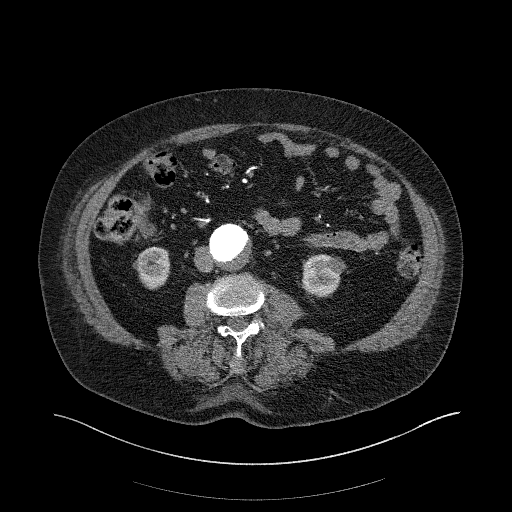
[im 171/240  soft-tissue]
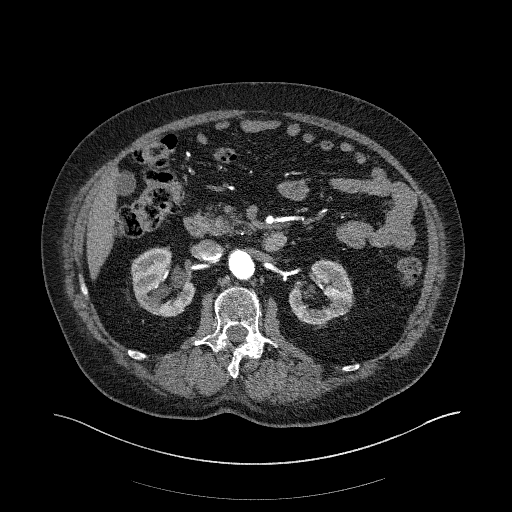
[im 171/240  bone]
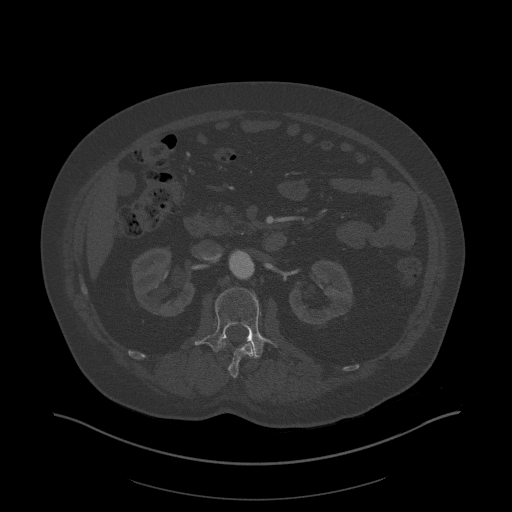
[im 183/240  soft-tissue]
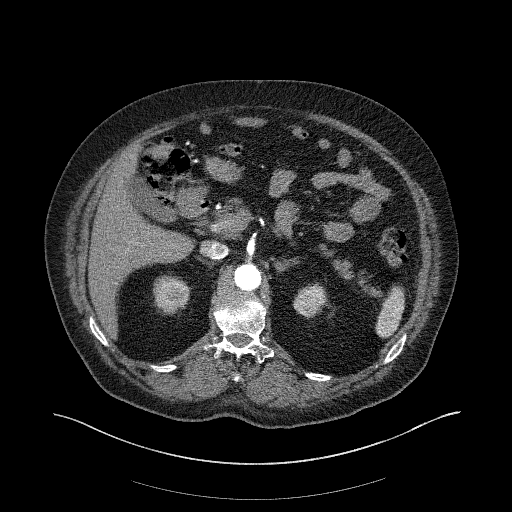
[im 205/240  soft-tissue]
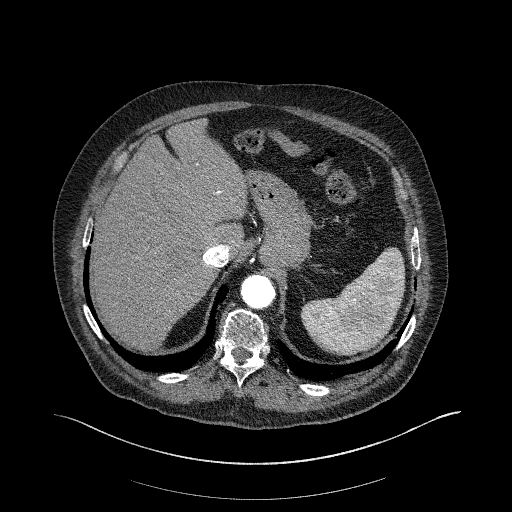
[im 228/240  soft-tissue]
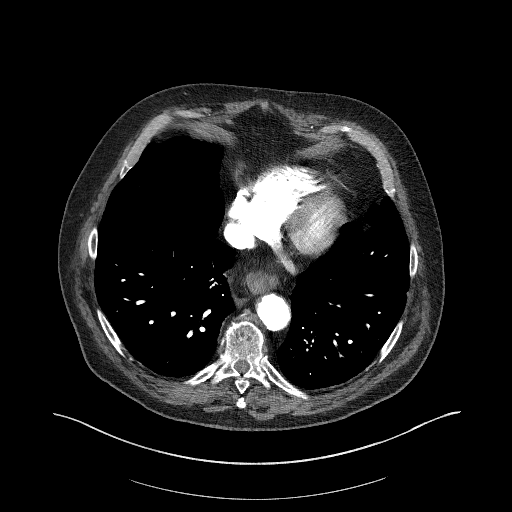

[Series 8: coronal arterial mpr · coronal · arterial · 0.86mm/px · 3 of 176 slices shown]
[im 36/176  soft-tissue]
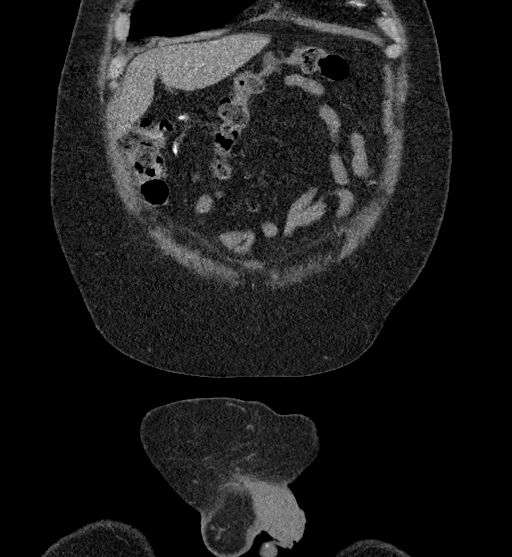
[im 71/176  soft-tissue]
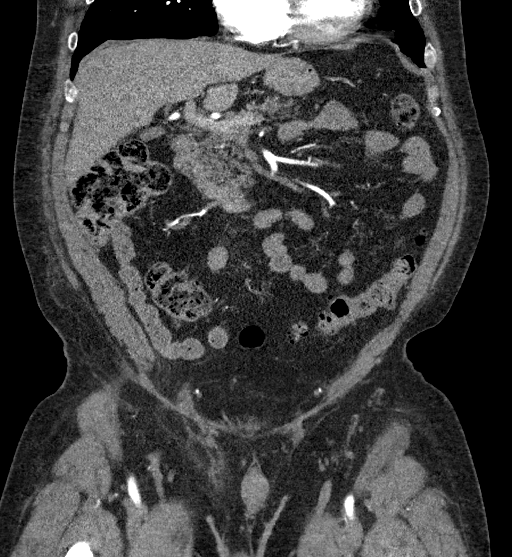
[im 106/176  soft-tissue]
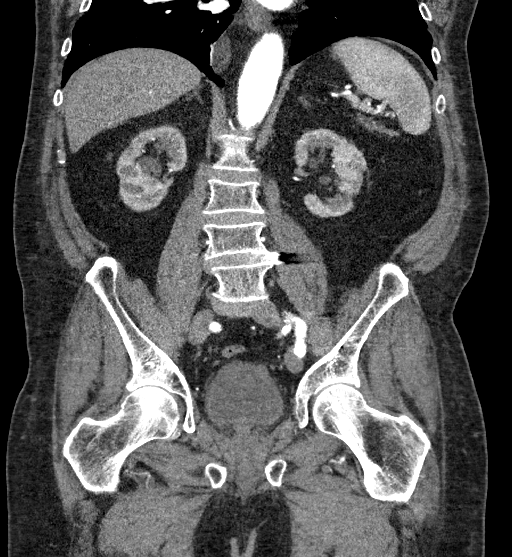

[15 of 46 positions shown; findings below may reference images not displayed]

FINDINGS: VASCULAR

Aorta: Fusiform aneurysmal dilation of the infrarenal abdominal
aorta with maximal dimensions of 5.1 x 5.0 cm (coronal and sagittal
reformatted images).

Celiac: Patent without evidence of aneurysm, dissection, vasculitis
or significant stenosis.

SMA: Patent without evidence of aneurysm, dissection, vasculitis or
significant stenosis.

Renals: Multiple renal arteries bilaterally. There are 3 renal
arteries on the right and 2 on the left. The most inferior renal
artery is a small accessory artery to the lower pole of the right
kidney. Adequate landing zone below this renal artery.

IMA: Patent without evidence of aneurysm, dissection, vasculitis or
significant stenosis.

Inflow: Patent without evidence of aneurysm, dissection, vasculitis
or significant stenosis.

Proximal Outflow: Bilateral common femoral and visualized portions
of the superficial and profunda femoral arteries are patent without
evidence of aneurysm, dissection, vasculitis or significant
stenosis.

Veins: No focal venous abnormality.

Review of the MIP images confirms the above findings.

NON-VASCULAR

Lower chest: Centrilobular pulmonary emphysema. No suspicious
pulmonary mass or nodule. Trace dependent atelectasis. The heart is
normal in size. Small to moderate hiatal hernia.

Hepatobiliary: No focal liver abnormality is seen. No gallstones,
gallbladder wall thickening, or biliary dilatation.

Pancreas: Unremarkable. No pancreatic ductal dilatation or
surrounding inflammatory changes.

Spleen: Normal in size without focal abnormality.

Adrenals/Urinary Tract: Normal adrenal glands. Multifocal
circumscribed low-attenuation renal lesions including parapelvic
renal sinus cysts. Nonobstructing 5 mm stone in the lower pole
collecting system of the right kidney.

Stomach/Bowel: Colonic diverticular disease without CT evidence of
active inflammation. No evidence of obstruction or focal bowel wall
thickening. The terminal ileum is unremarkable.

Lymphatic: No suspicious lymphadenopathy.

Reproductive: Prostate is unremarkable.

Other: Large fat containing right inguinal hernia.

Musculoskeletal: No acute fracture or aggressive appearing lytic or
blastic osseous lesion.
IMPRESSION: VASCULAR

1. Fusiform 5.1 cm abdominal aortic aneurysm. Patient already under
the care of a vascular surgeon and undergoing surgical planning.
Therefore, no follow-up recommendations given.
2. Multiple renal arteries bilaterally. The lowest renal artery is a
small accessory artery to the lower pole of the right kidney. There
is adequate landing zone inferior to this renal artery.

NON-VASCULAR

1. Centrilobular pulmonary emphysema.
2. Colonic diverticular disease without CT evidence of active
inflammation.
3. Nonobstructing right lower pole nephrolithiasis.
4. Large fat containing right inguinal hernia.
5. Small to moderate hiatal hernia.

Aortic aneurysm NOS (1Q757-A8K.R); Aortic Atherosclerosis
(1Q757-6D7.7) and Emphysema (1Q757-WGB.Q).

## 2022-05-13 ENCOUNTER — Other Ambulatory Visit: Payer: Self-pay

## 2022-05-13 DIAGNOSIS — R000 Tachycardia, unspecified: Secondary | ICD-10-CM

## 2022-05-13 MED ORDER — ATENOLOL 25 MG PO TABS: ORAL_TABLET | ORAL | 4 refills | Status: DC

## 2022-05-15 ENCOUNTER — Ambulatory Visit: Admit: 2022-05-15 | Payer: No Typology Code available for payment source | Admitting: Ophthalmology

## 2022-05-15 SURGERY — PHACOEMULSIFICATION, CATARACT, WITH IOL INSERTION
Anesthesia: Topical | Laterality: Left

## 2022-06-20 ENCOUNTER — Other Ambulatory Visit: Payer: Self-pay | Admitting: Family Medicine

## 2022-07-12 ENCOUNTER — Other Ambulatory Visit: Payer: Self-pay | Admitting: Family Medicine

## 2022-07-12 DIAGNOSIS — E039 Hypothyroidism, unspecified: Secondary | ICD-10-CM

## 2022-07-16 ENCOUNTER — Ambulatory Visit: Payer: Self-pay | Admitting: *Deleted

## 2022-07-16 NOTE — Telephone Encounter (Signed)
Reason for Disposition  [1] Longstanding difficulty breathing (e.g., CHF, COPD, emphysema) AND [2] WORSE than normal  Answer Assessment - Initial Assessment Questions 1. RESPIRATORY STATUS: "Describe your breathing?" (e.g., wheezing, shortness of breath, unable to speak, severe coughing)      Wakes freezing cold- trouble breathing, SOB with exertion 2. ONSET: "When did this breathing problem begin?"      2 days ago 3. PATTERN "Does the difficult breathing come and go, or has it been constant since it started?"      Come and going 4. SEVERITY: "How bad is your breathing?" (e.g., mild, moderate, severe)    - MILD: No SOB at rest, mild SOB with walking, speaks normally in sentences, can lie down, no retractions, pulse < 100.    - MODERATE: SOB at rest, SOB with minimal exertion and prefers to sit, cannot lie down flat, speaks in phrases, mild retractions, audible wheezing, pulse 100-120.    - SEVERE: Very SOB at rest, speaks in single words, struggling to breathe, sitting hunched forward, retractions, pulse > 120      mild 5. RECURRENT SYMPTOM: "Have you had difficulty breathing before?" If Yes, ask: "When was the last time?" and "What happened that time?"      3 weeks ago happened at [place]- stress test and pulmonary ordered 6. CARDIAC HISTORY: "Do you have any history of heart disease?" (e.g., heart attack, angina, bypass surgery, angioplasty)      Never has had heart problems 7. LUNG HISTORY: "Do you have any history of lung disease?"  (e.g., pulmonary embolus, asthma, emphysema)     COPD 8. CAUSE: "What do you think is causing the breathing problem?"      Not sure 9. OTHER SYMPTOMS: "Do you have any other symptoms? (e.g., dizziness, runny nose, cough, chest pain, fever)     No- fatigue  Protocols used: Breathing Difficulty-A-AH

## 2022-07-16 NOTE — Telephone Encounter (Signed)
  Chief Complaint: SOB with exertion- 3 weeks Symptoms: SOB with exertion- ongoing 3 weeks, fatigue Frequency: started 3 weeks ago- was worse for last 2 days- patient doing better today Pertinent Negatives: Patient denies dizziness, runny nose, cough, chest pain, fever Disposition: ED /[] Urgent Care (no appt availability in office) / Appointment(In office/virtual)/  [place] [facility]/ Home Care/ Refused Recommended Disposition /[]  Mobile Bus/  Follow-up with PCP Additional Notes: Patient's wife is calling with concerns of worsening SOB- patient gest SOB with exertion and fatigued. Offered appointment with another provider to have him seen sooner- but only wants to see PCP-  patient scheduled in same day slot for Friday- will send message for review to see if PCP wants to see sooner- but wife states that appointment is fine. Advised ED for any changes.

## 2022-07-19 ENCOUNTER — Encounter: Payer: Self-pay | Admitting: Family Medicine

## 2022-07-19 ENCOUNTER — Ambulatory Visit (INDEPENDENT_AMBULATORY_CARE_PROVIDER_SITE_OTHER): Payer: Medicare HMO | Admitting: Family Medicine

## 2022-07-19 VITALS — BP 115/63 | HR 62 | Ht 67.0 in | Wt 229.0 lb

## 2022-07-19 DIAGNOSIS — C9001 Multiple myeloma in remission: Secondary | ICD-10-CM

## 2022-07-19 DIAGNOSIS — R052 Subacute cough: Secondary | ICD-10-CM | POA: Diagnosis not present

## 2022-07-19 DIAGNOSIS — R0602 Shortness of breath: Secondary | ICD-10-CM | POA: Diagnosis not present

## 2022-07-19 DIAGNOSIS — R0609 Other forms of dyspnea: Secondary | ICD-10-CM | POA: Diagnosis not present

## 2022-07-19 DIAGNOSIS — J449 Chronic obstructive pulmonary disease, unspecified: Secondary | ICD-10-CM

## 2022-07-19 DIAGNOSIS — R600 Localized edema: Secondary | ICD-10-CM

## 2022-07-19 DIAGNOSIS — E039 Hypothyroidism, unspecified: Secondary | ICD-10-CM

## 2022-07-19 DIAGNOSIS — R7303 Prediabetes: Secondary | ICD-10-CM

## 2022-07-19 DIAGNOSIS — I10 Essential (primary) hypertension: Secondary | ICD-10-CM | POA: Diagnosis not present

## 2022-07-19 MED ORDER — MONTELUKAST SODIUM 10 MG PO TABS: 10.0000 mg | ORAL_TABLET | Freq: Every day | ORAL | 3 refills | Status: DC

## 2022-07-19 MED ORDER — HYDROCODONE-ACETAMINOPHEN 5-325 MG PO TABS
1.0000 | ORAL_TABLET | Freq: Four times a day (QID) | ORAL | 0 refills | Status: DC | PRN
Start: 2022-07-19 — End: 2023-07-02

## 2022-07-19 NOTE — Progress Notes (Signed)
Established patient visit   Patient: Ryan Ali   DOB: 03/25/1941   82 y.o. Male  MRN: [id number] Visit Date: 07/19/2022  Today's healthcare provider: Mila Merry, MD   No chief complaint on file.  Subjective    HPI  Presents today complaining of persistent and progressive shortness of breath on exertion over the last few months. Is being worked up at [place], states had ECG about 2 weeks ago and scheduled for stress test in May. Has long history of COPD and taking inhalers below prescribed at [facility]. Is in process of transferring to Dr. Karna Christmas for [facility]. States had labs checked at [place] 2 weeks ago showing elevated BUN/creatinine and advised to recheck after two weeks. Also states A1c was 6.6% at [place]. States that on at least two occasions has woke up in the middle of the night with chills and dyspnea. Also having progressively worse swelling of ankles.   States that PCP prescribed amlodipine to take place of atenolol due to fatigue, but patient has not yet made change.   Medications: Outpatient Medications Prior to Visit  Medication Sig   albuterol (VENTOLIN HFA) 108 (90 Base) MCG/ACT inhaler Inhale 2 puffs into the lungs every 6 (six) hours as needed for shortness of breath or wheezing.   Ascorbic Acid (VITAMIN C PO) Take 1,400 mg by mouth daily.   aspirin EC 81 MG tablet Take 81 mg by mouth daily. Swallow whole.   ASTAXANTHIN PO Take 12 mg by mouth daily.   atenolol (TENORMIN) 25 MG tablet TAKE 1 TABLET(25 MG) BY MOUTH EVERY MORNING   atorvastatin (LIPITOR) 20 MG tablet TAKE 1 TABLET BY MOUTH EVERY DAY   brimonidine (ALPHAGAN) 0.2 % ophthalmic solution Place 1 drop into the right eye in the morning and at bedtime.   budesonide-formoterol (SYMBICORT) 160-4.5 MCG/ACT inhaler Inhale 2 puffs into the lungs 2 (two) times daily.   Calcium Carbonate-Vit D-Min (CALCIUM 600+D PLUS MINERALS) 600-400 MG-UNIT CHEW Chew 3 tablets by mouth daily.   Cholecalciferol (VITAMIN D  PO) Take 1 tablet by mouth daily. 2000 mg   Elastic Bandages & Supports (MEDICAL COMPRESSION SOCKS) MISC Put on compression socks in the AM as soon as you wake up   furosemide (LASIX) 20 MG tablet TAKE 1 TABLET BY MOUTH EVERY OTHER DAY   levothyroxine (SYNTHROID) 150 MCG tablet TAKE 1 TABLET BY MOUTH EVERY DAY   pantoprazole (PROTONIX) 40 MG tablet Take 40 mg by mouth daily.   tamsulosin (FLOMAX) 0.4 MG CAPS capsule TAKE 1 CAPSULE BY MOUTH EVERY DAY   Tiotropium Bromide Monohydrate 1.25 MCG/ACT AERS INHALE 2 INHALATIONS ORAL INHALATION EVERY MORNING *REPLACES TIOTROPIUM HANDIHALER*   traMADol (ULTRAM) 50 MG tablet Take by mouth every 6 (six) hours as needed.   [DISCONTINUED] HYDROcodone-acetaminophen (NORCO/VICODIN) 5-325 MG tablet Take 1 tablet by mouth every 6 (six) hours as needed for moderate pain.   [DISCONTINUED] montelukast (SINGULAIR) 10 MG tablet Take 1 tablet (10 mg total) by mouth at bedtime.   amLODipine (NORVASC) 10 MG tablet TAKE ONE-HALF TABLET BY MOUTH EVERY DAY FOR BLOOD PRESSURE (Patient not taking: Reported on 07/19/2022)   No facility-administered medications prior to visit.    Review of Systems  Constitutional:  Negative for appetite change, chills and fever.  Respiratory:  Negative for chest tightness, shortness of breath and wheezing.   Cardiovascular:  Negative for chest pain and palpitations.  Gastrointestinal:  Negative for abdominal pain, nausea and vomiting.  Objective    BP 115/63 (BP Location: Left Arm, Patient Position: Sitting, Cuff Size: Large)   Pulse 62   Ht 5\' 7"  (1.702 m)   Wt 229 lb (103.9 kg)   SpO2 97%   BMI 35.87 kg/m    Physical Exam    General: Appearance:    Obese male in no acute distress  Eyes:    PERRL, conjunctiva/corneas clear, EOM's intact       Lungs:     Clear to auscultation bilaterally, respirations unlabored  Heart:    Normal heart rate. Normal rhythm. No murmurs, rubs, or gallops.    MS:   All extremities are intact.   3+ bilateral foot and ankle edema.   Neurologic:   Awake, alert, oriented x 3. No apparent focal neurological defect.         Assessment & Plan     1. SOBOE (shortness of breath on exertion)  - CBC with Differential/Platelet - Comprehensive metabolic panel - Brain natriuretic peptide - montelukast (SINGULAIR) 10 MG tablet; Take 1 tablet (10 mg total) by mouth at bedtime.  Dispense: 30 tablet; Refill: 3  Cardiac workup in progress at [facility].   2. Bilateral leg edema   3. Chronic obstructive pulmonary disease, unspecified COPD type (HCC) On inhalers prescribed   4. Primary hypertension Has not yet changed atenolol to amlodipine as prescribed at [facility].   5. Hypothyroidism, unspecified type  - T4, free - TSH  6. Subacute cough  - HYDROcodone-acetaminophen (NORCO/VICODIN) 5-325 MG tablet; Take 1 tablet by mouth every 6 (six) hours as needed for moderate pain.  Dispense: 30 tablet; Refill: 0  7. Multiple myeloma in remission (HCC)   8. Prediabetes Recent A1c was 6.6 at [place].          Mila Merry, MD  [facility] [phone] (phone) [phone] (fax)  [facility]

## 2022-07-19 NOTE — Patient Instructions (Signed)
.   Please review the attached list of medications and notify my office if there are any errors.   . Please bring all of your medications to every appointment so we can make sure that our medication list is the same as yours.   

## 2022-07-20 LAB — COMPREHENSIVE METABOLIC PANEL
ALT: 16 IU/L (ref 0–44)
AST: 19 IU/L (ref 0–40)
Albumin/Globulin Ratio: 1.7 (ref 1.2–2.2)
Albumin: 3.8 g/dL (ref 3.7–4.7)
Alkaline Phosphatase: 95 IU/L (ref 44–121)
BUN/Creatinine Ratio: 17 (ref 10–24)
BUN: 23 mg/dL (ref 8–27)
Bilirubin Total: 1.1 mg/dL (ref 0.0–1.2)
CO2: 20 mmol/L (ref 20–29)
Calcium: 8.7 mg/dL (ref 8.6–10.2)
Chloride: 105 mmol/L (ref 96–106)
Creatinine, Ser: 1.38 mg/dL — ABNORMAL HIGH (ref 0.76–1.27)
Globulin, Total: 2.3 g/dL (ref 1.5–4.5)
Glucose: 122 mg/dL — ABNORMAL HIGH (ref 70–99)
Potassium: 4.8 mmol/L (ref 3.5–5.2)
Sodium: 140 mmol/L (ref 134–144)
Total Protein: 6.1 g/dL (ref 6.0–8.5)
eGFR: 51 mL/min/{1.73_m2} — ABNORMAL LOW (ref >59)

## 2022-07-20 LAB — CBC WITH DIFFERENTIAL/PLATELET
Basophils Absolute: 0 10*3/uL (ref 0.0–0.2)
Basos: 1 %
EOS (ABSOLUTE): 0.2 10*3/uL (ref 0.0–0.4)
Eos: 2 %
Hematocrit: 42.1 % (ref 37.5–51.0)
Hemoglobin: 13.6 g/dL (ref 13.0–17.7)
Immature Grans (Abs): 0 10*3/uL (ref 0.0–0.1)
Immature Granulocytes: 0 %
Lymphocytes Absolute: 1.6 10*3/uL (ref 0.7–3.1)
Lymphs: 22 %
MCH: 31.3 pg (ref 26.6–33.0)
MCHC: 32.3 g/dL (ref 31.5–35.7)
MCV: 97 fL (ref 79–97)
Monocytes Absolute: 0.4 10*3/uL (ref 0.1–0.9)
Monocytes: 6 %
Neutrophils Absolute: 5.1 10*3/uL (ref 1.4–7.0)
Neutrophils: 69 %
Platelets: 189 10*3/uL (ref 150–450)
RBC: 4.35 x10E6/uL (ref 4.14–5.80)
RDW: 13.1 % (ref 11.6–15.4)
WBC: 7.4 10*3/uL (ref 3.4–10.8)

## 2022-07-20 LAB — T4, FREE: Free T4: 1.65 ng/dL (ref 0.82–1.77)

## 2022-07-20 LAB — TSH: TSH: 3.45 u[IU]/mL (ref 0.450–4.500)

## 2022-07-20 LAB — BRAIN NATRIURETIC PEPTIDE: BNP: 87.9 pg/mL (ref 0.0–100.0)

## 2022-08-05 ENCOUNTER — Ambulatory Visit: Payer: Medicare HMO | Admitting: Family Medicine

## 2022-08-26 DIAGNOSIS — H538 Other visual disturbances: Secondary | ICD-10-CM | POA: Diagnosis not present

## 2022-08-26 DIAGNOSIS — H40002 Preglaucoma, unspecified, left eye: Secondary | ICD-10-CM | POA: Diagnosis not present

## 2022-08-26 DIAGNOSIS — H2512 Age-related nuclear cataract, left eye: Secondary | ICD-10-CM | POA: Diagnosis not present

## 2022-08-26 DIAGNOSIS — H401111 Primary open-angle glaucoma, right eye, mild stage: Secondary | ICD-10-CM | POA: Diagnosis not present

## 2022-09-04 ENCOUNTER — Encounter (INDEPENDENT_AMBULATORY_CARE_PROVIDER_SITE_OTHER): Payer: Self-pay | Admitting: Nurse Practitioner

## 2022-09-04 ENCOUNTER — Ambulatory Visit (INDEPENDENT_AMBULATORY_CARE_PROVIDER_SITE_OTHER): Payer: Medicare HMO

## 2022-09-04 ENCOUNTER — Ambulatory Visit (INDEPENDENT_AMBULATORY_CARE_PROVIDER_SITE_OTHER): Payer: Medicare HMO | Admitting: Nurse Practitioner

## 2022-09-04 VITALS — BP 98/65 | HR 60 | Resp 18 | Ht 67.0 in | Wt 224.4 lb

## 2022-09-04 DIAGNOSIS — I7143 Infrarenal abdominal aortic aneurysm, without rupture: Secondary | ICD-10-CM

## 2022-09-04 DIAGNOSIS — I70213 Atherosclerosis of native arteries of extremities with intermittent claudication, bilateral legs: Secondary | ICD-10-CM

## 2022-09-04 DIAGNOSIS — M7989 Other specified soft tissue disorders: Secondary | ICD-10-CM

## 2022-09-04 DIAGNOSIS — I10 Essential (primary) hypertension: Secondary | ICD-10-CM | POA: Diagnosis not present

## 2022-09-04 DIAGNOSIS — E1159 Type 2 diabetes mellitus with other circulatory complications: Secondary | ICD-10-CM | POA: Diagnosis not present
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
## 2022-09-05 LAB — VAS US ABI WITH/WO TBI
Left ABI: 1.07
Right ABI: 1.1

## 2022-09-16 DIAGNOSIS — H2512 Age-related nuclear cataract, left eye: Secondary | ICD-10-CM | POA: Diagnosis not present

## 2022-09-18 ENCOUNTER — Encounter: Payer: Self-pay | Admitting: Ophthalmology

## 2022-09-19 ENCOUNTER — Other Ambulatory Visit: Payer: Self-pay | Admitting: Family Medicine

## 2022-09-19 DIAGNOSIS — N179 Acute kidney failure, unspecified: Secondary | ICD-10-CM
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
## 2022-09-20 NOTE — Discharge Instructions (Signed)

## 2022-09-20 NOTE — Telephone Encounter (Signed)
Requested medications are due for refill today.  yes  Requested medications are on the active medications list.  yes  Last refill. 03/21/2022 #90 1 rf  Future visit scheduled.   no  Notes to clinic.  Labs are expired.    Requested Prescriptions  Pending Prescriptions Disp Refills   tamsulosin (FLOMAX) 0.4 MG CAPS capsule [Pharmacy Med Name: TAMSULOSIN HCL 0.4 MG CAPSULE] 90 capsule 1    Sig: TAKE 1 CAPSULE BY MOUTH EVERY DAY     Urology: Alpha-Adrenergic Blocker Failed - 09/19/2022  2:40 AM      Failed - PSA in normal range and within 360 days    Prostate Specific Ag, Serum  Date Value Ref Range Status  11/01/2019 0.3 0.0 - 4.0 ng/mL Final    Comment:    Roche ECLIA methodology. According to the American Urological Association, Serum PSA should decrease and remain at undetectable levels after radical prostatectomy. The AUA defines biochemical recurrence as an initial PSA value 0.2 ng/mL or greater followed by a subsequent confirmatory PSA value 0.2 ng/mL or greater. Values obtained with different assay methods or kits cannot be used interchangeably. Results cannot be interpreted as absolute evidence of the presence or absence of malignant disease.          Passed - Last BP in normal range    BP Readings from Last 1 Encounters:  09/04/22 98/65         Passed - Valid encounter within last 12 months    Recent Outpatient Visits           2 months ago SOBOE (shortness of breath on exertion)   [place] [facility] Malva Limes, MD   7 months ago Annual physical exam   [facility] Malva Limes, MD   8 months ago Hypothyroidism, postop   [facility] Malva Limes, MD   1 year ago Bilateral leg edema   [facility] Mecum, Oswaldo Conroy, PA-C   1 year ago Hypothyroidism, unspecified type   [facility] Caro Laroche, DO

## 2022-09-25 ENCOUNTER — Other Ambulatory Visit: Payer: Self-pay

## 2022-09-25 ENCOUNTER — Ambulatory Visit
Admission: RE | Admit: 2022-09-25 | Discharge: 2022-09-25 | Disposition: A | Discharge status: Home / Self Care | Payer: Medicare HMO | Attending: Ophthalmology | Admitting: Ophthalmology

## 2022-09-25 ENCOUNTER — Ambulatory Visit: Payer: Medicare HMO | Admitting: Anesthesiology

## 2022-09-25 ENCOUNTER — Encounter: Payer: Self-pay | Admitting: Ophthalmology

## 2022-09-25 ENCOUNTER — Encounter
Admission: RE | Disposition: A | Discharge status: Home / Self Care | Payer: Self-pay | Source: Home / Self Care | Attending: Ophthalmology

## 2022-09-25 DIAGNOSIS — K219 Gastro-esophageal reflux disease without esophagitis: Secondary | ICD-10-CM | POA: Diagnosis not present

## 2022-09-25 DIAGNOSIS — E039 Hypothyroidism, unspecified: Secondary | ICD-10-CM | POA: Insufficient documentation

## 2022-09-25 DIAGNOSIS — C9000 Multiple myeloma not having achieved remission: Secondary | ICD-10-CM | POA: Diagnosis not present

## 2022-09-25 DIAGNOSIS — H9190 Unspecified hearing loss, unspecified ear: Secondary | ICD-10-CM | POA: Insufficient documentation

## 2022-09-25 DIAGNOSIS — Z87891 Personal history of nicotine dependence: Secondary | ICD-10-CM | POA: Diagnosis not present

## 2022-09-25 DIAGNOSIS — I70219 Atherosclerosis of native arteries of extremities with intermittent claudication, unspecified extremity: Secondary | ICD-10-CM | POA: Diagnosis not present

## 2022-09-25 DIAGNOSIS — I2510 Atherosclerotic heart disease of native coronary artery without angina pectoris: Secondary | ICD-10-CM | POA: Diagnosis not present

## 2022-09-25 DIAGNOSIS — J449 Chronic obstructive pulmonary disease, unspecified: Secondary | ICD-10-CM | POA: Insufficient documentation

## 2022-09-25 DIAGNOSIS — Z7951 Long term (current) use of inhaled steroids: Secondary | ICD-10-CM | POA: Insufficient documentation

## 2022-09-25 DIAGNOSIS — H2512 Age-related nuclear cataract, left eye: Secondary | ICD-10-CM | POA: Diagnosis not present

## 2022-09-25 DIAGNOSIS — Z09 Encounter for follow-up examination after completed treatment for conditions other than malignant neoplasm: Secondary | ICD-10-CM | POA: Insufficient documentation

## 2022-09-25 DIAGNOSIS — I10 Essential (primary) hypertension: Secondary | ICD-10-CM | POA: Insufficient documentation

## 2022-09-25 DIAGNOSIS — E1136 Type 2 diabetes mellitus with diabetic cataract: Secondary | ICD-10-CM | POA: Diagnosis not present

## 2022-09-25 HISTORY — DX: Unspecified osteoarthritis, unspecified site: M19.90

## 2022-09-25 HISTORY — DX: Other symptoms and signs involving the musculoskeletal system: R29.898

## 2022-09-25 HISTORY — PX: CATARACT EXTRACTION W/PHACO: SHX586

## 2022-09-25 HISTORY — DX: Presence of external hearing-aid: Z97.4

## 2022-09-25 SURGERY — PHACOEMULSIFICATION, CATARACT, WITH IOL INSERTION
Anesthesia: Monitor Anesthesia Care | Site: Eye | Laterality: Left

## 2022-09-25 MED ORDER — MIDAZOLAM HCL 2 MG/2ML IJ SOLN
INTRAMUSCULAR | Status: DC | PRN
  Administered 2022-09-25: 1 mg via INTRAVENOUS

## 2022-09-25 MED ORDER — LACTATED RINGERS IV SOLN: INTRAVENOUS | Status: DC

## 2022-09-25 MED ORDER — CEFUROXIME OPHTHALMIC INJECTION 1 MG/0.1 ML
INJECTION | OPHTHALMIC | Status: DC | PRN
  Administered 2022-09-25: .1 mL via INTRACAMERAL

## 2022-09-25 MED ORDER — SIGHTPATH DOSE#1 BSS IO SOLN
INTRAOCULAR | Status: DC | PRN
  Administered 2022-09-25: 15 mL

## 2022-09-25 MED ORDER — BRIMONIDINE TARTRATE-TIMOLOL 0.2-0.5 % OP SOLN
OPHTHALMIC | Status: DC | PRN
  Administered 2022-09-25: 1 [drp] via OPHTHALMIC

## 2022-09-25 MED ORDER — TETRACAINE HCL 0.5 % OP SOLN
1.0000 [drp] | OPHTHALMIC | Status: DC | PRN
  Administered 2022-09-25 (×3): 1 [drp] via OPHTHALMIC

## 2022-09-25 MED ORDER — SIGHTPATH DOSE#1 BSS IO SOLN
INTRAOCULAR | Status: DC | PRN
  Administered 2022-09-25: 1 mL via INTRAMUSCULAR

## 2022-09-25 MED ORDER — ARMC OPHTHALMIC DILATING DROPS
1.0000 | OPHTHALMIC | Status: DC | PRN
  Administered 2022-09-25 (×3): 1 via OPHTHALMIC

## 2022-09-25 MED ORDER — SIGHTPATH DOSE#1 NA HYALUR & NA CHOND-NA HYALUR IO KIT
PACK | INTRAOCULAR | Status: DC | PRN
  Administered 2022-09-25: 1 via OPHTHALMIC

## 2022-09-25 MED ORDER — SIGHTPATH DOSE#1 BSS IO SOLN
INTRAOCULAR | Status: DC | PRN
  Administered 2022-09-25: 92 mL via OPHTHALMIC

## 2022-09-25 MED ORDER — FENTANYL CITRATE (PF) 100 MCG/2ML IJ SOLN
INTRAMUSCULAR | Status: DC | PRN
  Administered 2022-09-25 (×2): 25 ug via INTRAVENOUS

## 2022-09-25 SURGICAL SUPPLY — 10 items
CATARACT SUITE SIGHTPATH (MISCELLANEOUS) ×1 IMPLANT
FEE CATARACT SUITE SIGHTPATH (MISCELLANEOUS) ×1 IMPLANT
GLOVE SRG 8 PF TXTR STRL LF DI (GLOVE) ×1 IMPLANT
GLOVE SURG ENC TEXT LTX SZ7.5 (GLOVE) ×1 IMPLANT
GLOVE SURG UNDER POLY LF SZ8 (GLOVE) ×1
LENS IOL TECNIS EYHANCE 24.0 (Intraocular Lens) IMPLANT
NDL FILTER BLUNT 18X1 1/2 (NEEDLE) ×1 IMPLANT
NEEDLE FILTER BLUNT 18X1 1/2 (NEEDLE) ×1 IMPLANT
RING MALYGIN 7.0 (MISCELLANEOUS) IMPLANT
SYR 3ML LL SCALE MARK (SYRINGE) ×1 IMPLANT

## 2022-09-25 NOTE — Op Note (Signed)
  OPERATIVE NOTE  Ryan Ali [id number] 09/25/2022  PREOPERATIVE DIAGNOSIS:   Nuclear sclerotic cataract left eye with miotic pupil      H25.12   POSTOPERATIVE DIAGNOSIS:   Nuclear sclerotic cataract left eye with miotic pupil.     PROCEDURE:  Phacoemulsification with posterior chamber intraocular lens implantation of the left eye which required pupil stretching with the Malyugin pupil expansion device  Ultrasound time: Procedure(s): CATARACT EXTRACTION PHACO AND INTRAOCULAR LENS PLACEMENT (IOC) LEFT MALYUGIN  14.35  01:12.6 (Left)  LENS:   Implant Name Type Inv. Item Serial No. Manufacturer Lot No. LRB No. Used Action  LENS IOL TECNIS EYHANCE 24.0 - [id number] Intraocular Lens LENS IOL TECNIS EYHANCE 24.0 [id number] [employer]  Left 1 Implanted          SURGEON:  Deirdre Evener, MD   ANESTHESIA: Topical with tetracaine drops and 2% Xylocaine jelly, augmented with 1% preservative-free intracameral lidocaine.   COMPLICATIONS:  None.   DESCRIPTION OF PROCEDURE:  The patient was identified in the holding room and transported to the operating room and placed in the supine position under the operating microscope.  The left eye was identified as the operative eye and it was prepped and draped in the usual sterile ophthalmic fashion.   A 1 millimeter clear-corneal paracentesis was made at the 1:30 position.  The anterior chamber was filled with Viscoat viscoelastic.  0.5 ml of preservative-free 1% lidocaine was injected into the anterior chamber.  A 2.4 millimeter keratome was used to make a near-clear corneal incision at the 10:30 position.  A Malyugin pupil expander was then placed through the main incision and into the anterior chamber of the eye.  The edge of the iris was secured on the lip of the pupil expander and it was released, thereby expanding the pupil to approximately 7 millimeters for completion of the cataract surgery.  Additional Viscoat was placed in the anterior  chamber.  A cystotome and capsulorrhexis forceps were used to make a curvilinear capsulorrhexis.   Balanced salt solution was used to hydrodissect and hydrodelineate the lens nucleus.   Phacoemulsification was used in stop and chop fashion to remove the lens, nucleus and epinucleus.  The remaining cortex was aspirated using the irrigation aspiration handpiece.  Additional Provisc was placed into the eye to distend the capsular bag for lens placement.  A lens was then injected into the capsular bag.  The pupil expanding ring was removed using a Kuglen hook and insertion device. The remaining viscoelastic was aspirated from the capsular bag and the anterior chamber.  The anterior chamber was filled with balanced salt solution to inflate to a physiologic pressure.   Wounds were hydrated with balanced salt solution.  The anterior chamber was inflated to a physiologic pressure with balanced salt solution.  No wound leaks were noted. Cefuroxime 0.1 ml of a 10mg /ml solution was injected into the anterior chamber for a dose of 1 mg of intracameral antibiotic at the completion of the case.   Timolol and Brimonidine drops were applied to the eye.  The patient was taken to the recovery room in stable condition without complications of anesthesia or surgery.  [name] 09/25/2022, 11:47 AM
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
## 2022-09-25 NOTE — Transfer of Care (Signed)
Immediate Anesthesia Transfer of Care Note  Patient: Ryan Ali  Procedure(s) Performed: CATARACT EXTRACTION PHACO AND INTRAOCULAR LENS PLACEMENT (IOC) LEFT MALYUGIN  14.35  01:12.6 (Left: Eye)  Patient Location: PACU  Anesthesia Type: MAC  Level of Consciousness: awake, alert  and patient cooperative  Airway and Oxygen Therapy: Patient Spontanous Breathing and Patient connected to supplemental oxygen  Post-op Assessment: Post-op Vital signs reviewed, Patient's Cardiovascular Status Stable, Respiratory Function Stable, Patent Airway and No signs of Nausea or vomiting  Post-op Vital Signs: Reviewed and stable  Complications: No notable events documented.

## 2022-09-25 NOTE — Anesthesia Postprocedure Evaluation (Signed)
Anesthesia Post Note  Patient: Ryan Ali  Procedure(s) Performed: CATARACT EXTRACTION PHACO AND INTRAOCULAR LENS PLACEMENT (IOC) LEFT MALYUGIN  14.35  01:12.6 (Left: Eye)  Patient location during evaluation: PACU Anesthesia Type: MAC Level of consciousness: awake and alert Pain management: pain level controlled Vital Signs Assessment: post-procedure vital signs reviewed and stable Respiratory status: spontaneous breathing, nonlabored ventilation, respiratory function stable and patient connected to nasal cannula oxygen Cardiovascular status: stable and blood pressure returned to baseline Postop Assessment: no apparent nausea or vomiting Anesthetic complications: no   No notable events documented.   Last Vitals:  Vitals:   09/25/22 1150 09/25/22 1155  BP: 127/67 123/75  Pulse: 81 84  Resp: 20 20  Temp: (!) 36.1 C (!) 36.1 C  SpO2: 95% 94%    Last Pain:  Vitals:   09/25/22 1155  TempSrc:   PainSc: 0-No pain                 Alistar Mcenery C Shaan Rhoads

## 2022-10-14 ENCOUNTER — Other Ambulatory Visit: Payer: Self-pay | Admitting: Family Medicine

## 2022-10-14 DIAGNOSIS — R0602 Shortness of breath: Secondary | ICD-10-CM

## 2022-10-29 DIAGNOSIS — Z961 Presence of intraocular lens: Secondary | ICD-10-CM | POA: Diagnosis not present

## 2022-11-12 DIAGNOSIS — H409 Unspecified glaucoma: Secondary | ICD-10-CM | POA: Diagnosis not present

## 2022-11-12 DIAGNOSIS — J301 Allergic rhinitis due to pollen: Secondary | ICD-10-CM | POA: Diagnosis not present

## 2022-11-12 DIAGNOSIS — M1990 Unspecified osteoarthritis, unspecified site: Secondary | ICD-10-CM | POA: Diagnosis not present

## 2022-11-12 DIAGNOSIS — M545 Low back pain, unspecified: Secondary | ICD-10-CM | POA: Diagnosis not present

## 2022-11-12 DIAGNOSIS — M62838 Other muscle spasm: Secondary | ICD-10-CM | POA: Diagnosis not present

## 2022-11-12 DIAGNOSIS — N189 Chronic kidney disease, unspecified: Secondary | ICD-10-CM | POA: Diagnosis not present

## 2022-11-12 DIAGNOSIS — Z008 Encounter for other general examination: Secondary | ICD-10-CM | POA: Diagnosis not present

## 2022-11-12 DIAGNOSIS — N400 Enlarged prostate without lower urinary tract symptoms: Secondary | ICD-10-CM | POA: Diagnosis not present

## 2022-11-12 DIAGNOSIS — K219 Gastro-esophageal reflux disease without esophagitis: Secondary | ICD-10-CM | POA: Diagnosis not present

## 2022-11-12 DIAGNOSIS — I2510 Atherosclerotic heart disease of native coronary artery without angina pectoris: Secondary | ICD-10-CM | POA: Diagnosis not present

## 2022-11-12 DIAGNOSIS — R2681 Unsteadiness on feet: Secondary | ICD-10-CM | POA: Diagnosis not present

## 2022-11-12 DIAGNOSIS — E785 Hyperlipidemia, unspecified: Secondary | ICD-10-CM | POA: Diagnosis not present

## 2022-11-12 LAB — LAB REPORT - SCANNED
Albumin/Creatinine Ratio, Urine, POC: <30
EGFR: 60

## 2022-12-20 ENCOUNTER — Other Ambulatory Visit: Payer: Self-pay | Admitting: Family Medicine

## 2022-12-20 DIAGNOSIS — N179 Acute kidney failure, unspecified: Secondary | ICD-10-CM

## 2023-01-13 ENCOUNTER — Other Ambulatory Visit: Payer: Self-pay | Admitting: Family Medicine

## 2023-01-13 DIAGNOSIS — E039 Hypothyroidism, unspecified: Secondary | ICD-10-CM

## 2023-01-20 DIAGNOSIS — J449 Chronic obstructive pulmonary disease, unspecified: Secondary | ICD-10-CM | POA: Diagnosis not present

## 2023-03-18 ENCOUNTER — Other Ambulatory Visit: Payer: Self-pay | Admitting: Family Medicine

## 2023-03-18 DIAGNOSIS — N179 Acute kidney failure, unspecified: Secondary | ICD-10-CM

## 2023-03-18 NOTE — Telephone Encounter (Signed)
Requested medication (s) are due for refill today: Yes  Requested medication (s) are on the active medication list: Yes  Last refill:  12/20/22  Future visit scheduled: No  Notes to clinic:  Unable to refill per protocol due to failed labs, no updated results.      Requested Prescriptions  Pending Prescriptions Disp Refills   tamsulosin (FLOMAX) 0.4 MG CAPS capsule [Pharmacy Med Name: TAMSULOSIN HCL 0.4 MG CAPSULE] 90 capsule 0    Sig: TAKE 1 CAPSULE BY MOUTH EVERY DAY     Urology: Alpha-Adrenergic Blocker Failed - 03/18/2023  3:07 PM      Failed - PSA in normal range and within 360 days    Prostate Specific Ag, Serum  Date Value Ref Range Status  11/01/2019 0.3 0.0 - 4.0 ng/mL Final    Comment:    Roche ECLIA methodology. According to the American Urological Association, Serum PSA should decrease and remain at undetectable levels after radical prostatectomy. The AUA defines biochemical recurrence as an initial PSA value 0.2 ng/mL or greater followed by a subsequent confirmatory PSA value 0.2 ng/mL or greater. Values obtained with different assay methods or kits cannot be used interchangeably. Results cannot be interpreted as absolute evidence of the presence or absence of malignant disease.          Failed - Valid encounter within last 12 months    Recent Outpatient Visits           8 months ago SOBOE (shortness of breath on exertion)   [place] [facility] Malva Limes, MD   1 year ago Annual physical exam   [place] [facility] Malva Limes, MD   1 year ago Hypothyroidism, postop   [place] [facility] Malva Limes, MD   1 year ago Bilateral leg edema   [place] [facility] Mecum, Oswaldo Conroy, PA-C   1 year ago Hypothyroidism, unspecified type   [facility] Dense M, DO              Passed - Last BP in normal range    BP Readings from Last 1  Encounters:  09/25/22 123/75

## 2023-04-02 ENCOUNTER — Other Ambulatory Visit: Payer: Self-pay | Admitting: Family Medicine

## 2023-04-02 DIAGNOSIS — N179 Acute kidney failure, unspecified: Secondary | ICD-10-CM

## 2023-04-12 ENCOUNTER — Other Ambulatory Visit: Payer: Self-pay | Admitting: Family Medicine

## 2023-04-12 DIAGNOSIS — R0602 Shortness of breath: Secondary | ICD-10-CM

## 2023-05-01 DIAGNOSIS — H353131 Nonexudative age-related macular degeneration, bilateral, early dry stage: Secondary | ICD-10-CM | POA: Diagnosis not present

## 2023-05-01 DIAGNOSIS — H40002 Preglaucoma, unspecified, left eye: Secondary | ICD-10-CM | POA: Diagnosis not present

## 2023-05-01 DIAGNOSIS — Z961 Presence of intraocular lens: Secondary | ICD-10-CM | POA: Diagnosis not present

## 2023-05-01 DIAGNOSIS — H401111 Primary open-angle glaucoma, right eye, mild stage: Secondary | ICD-10-CM | POA: Diagnosis not present

## 2023-06-03 DIAGNOSIS — J449 Chronic obstructive pulmonary disease, unspecified: Secondary | ICD-10-CM | POA: Diagnosis not present

## 2023-06-03 LAB — LAB REPORT - SCANNED: A1c: 7.6

## 2023-06-05 ENCOUNTER — Other Ambulatory Visit: Payer: Self-pay | Admitting: Pulmonary Disease

## 2023-06-05 DIAGNOSIS — J449 Chronic obstructive pulmonary disease, unspecified: Secondary | ICD-10-CM

## 2023-06-05 DIAGNOSIS — J9611 Chronic respiratory failure with hypoxia: Secondary | ICD-10-CM

## 2023-06-05 DIAGNOSIS — Z86711 Personal history of pulmonary embolism: Secondary | ICD-10-CM

## 2023-06-06 ENCOUNTER — Ambulatory Visit: Attending: Pulmonary Disease

## 2023-06-10 ENCOUNTER — Ambulatory Visit
Admission: RE | Admit: 2023-06-10 | Discharge: 2023-06-10 | Disposition: A | Discharge status: Home / Self Care | Source: Ambulatory Visit | Attending: Pulmonary Disease | Admitting: Pulmonary Disease

## 2023-06-10 DIAGNOSIS — J449 Chronic obstructive pulmonary disease, unspecified: Secondary | ICD-10-CM | POA: Diagnosis present

## 2023-06-10 MED ORDER — IOHEXOL 350 MG/ML SOLN
75.0000 mL | Freq: Once | INTRAVENOUS | Status: AC | PRN
  Administered 2023-06-10: 75 mL via INTRAVENOUS

## 2023-06-27 ENCOUNTER — Encounter: Payer: Self-pay | Admitting: Family Medicine

## 2023-06-27 DIAGNOSIS — R052 Subacute cough: Secondary | ICD-10-CM

## 2023-07-02 MED ORDER — HYDROCODONE-ACETAMINOPHEN 5-325 MG PO TABS
1.0000 | ORAL_TABLET | Freq: Four times a day (QID) | ORAL | 0 refills | Status: DC | PRN
Start: 2023-07-02 — End: 2023-08-21

## 2023-07-21 ENCOUNTER — Ambulatory Visit: Admitting: Family Medicine

## 2023-07-22 ENCOUNTER — Other Ambulatory Visit: Payer: Self-pay | Admitting: Family Medicine

## 2023-07-22 DIAGNOSIS — E039 Hypothyroidism, unspecified: Secondary | ICD-10-CM

## 2023-08-12 ENCOUNTER — Encounter (INDEPENDENT_AMBULATORY_CARE_PROVIDER_SITE_OTHER): Payer: Self-pay

## 2023-08-21 ENCOUNTER — Encounter: Payer: Self-pay | Admitting: Family Medicine

## 2023-08-21 ENCOUNTER — Other Ambulatory Visit: Payer: Self-pay

## 2023-08-21 DIAGNOSIS — R052 Subacute cough: Secondary | ICD-10-CM

## 2023-08-21 NOTE — Telephone Encounter (Signed)
 LOV 4*26*24 NOV [id number] LRF [id number] LABS [id number]

## 2023-08-21 NOTE — Telephone Encounter (Signed)
 Rx request has been sent to the provider to be reviewed

## 2023-08-22 MED ORDER — HYDROCODONE-ACETAMINOPHEN 5-325 MG PO TABS: 1.0000 | ORAL_TABLET | Freq: Four times a day (QID) | ORAL | 0 refills | Status: DC | PRN

## 2023-09-02 ENCOUNTER — Other Ambulatory Visit: Payer: Self-pay | Admitting: Family Medicine

## 2023-09-02 NOTE — Telephone Encounter (Signed)
 Requested medications are due for refill today.  yes  Requested medications are on the active medications list.  yes  Last refill. 06/20/2022 #90 4 rf  Future visit scheduled.   yes  Notes to clinic.  Labs are expired.    Requested Prescriptions  Pending Prescriptions Disp Refills   atorvastatin  (LIPITOR) 20 MG tablet [Pharmacy Med Name: ATORVASTATIN  20 MG TABLET] 90 tablet 4    Sig: TAKE 1 TABLET BY MOUTH EVERY DAY     Cardiovascular:  Antilipid - Statins Failed - 09/02/2023  3:37 PM      Failed - Valid encounter within last 12 months    Recent Outpatient Visits   None            Failed - Lipid Panel in normal range within the last 12 months    Cholesterol, Total  Date Value Ref Range Status  01/09/2022 105 100 - 199 mg/dL Final   LDL Chol Calc (NIH)  Date Value Ref Range Status  01/09/2022 46 0 - 99 mg/dL Final   HDL  Date Value Ref Range Status  01/09/2022 45 >39 mg/dL Final   Triglycerides  Date Value Ref Range Status  01/09/2022 66 0 - 149 mg/dL Final         Passed - Patient is not pregnant

## 2023-09-04 ENCOUNTER — Other Ambulatory Visit (INDEPENDENT_AMBULATORY_CARE_PROVIDER_SITE_OTHER): Payer: Self-pay | Admitting: Nurse Practitioner

## 2023-09-04 DIAGNOSIS — I7143 Infrarenal abdominal aortic aneurysm, without rupture: Secondary | ICD-10-CM

## 2023-09-04 DIAGNOSIS — I70213 Atherosclerosis of native arteries of extremities with intermittent claudication, bilateral legs: Secondary | ICD-10-CM

## 2023-09-05 ENCOUNTER — Ambulatory Visit (INDEPENDENT_AMBULATORY_CARE_PROVIDER_SITE_OTHER): Payer: Medicare HMO | Admitting: Nurse Practitioner

## 2023-09-05 ENCOUNTER — Other Ambulatory Visit (INDEPENDENT_AMBULATORY_CARE_PROVIDER_SITE_OTHER): Payer: Medicare HMO

## 2023-09-05 ENCOUNTER — Encounter (INDEPENDENT_AMBULATORY_CARE_PROVIDER_SITE_OTHER): Payer: Medicare HMO

## 2023-09-05 DIAGNOSIS — J439 Emphysema, unspecified: Secondary | ICD-10-CM | POA: Diagnosis not present

## 2023-09-05 DIAGNOSIS — D6869 Other thrombophilia: Secondary | ICD-10-CM | POA: Diagnosis not present

## 2023-09-05 DIAGNOSIS — Z008 Encounter for other general examination: Secondary | ICD-10-CM | POA: Diagnosis not present

## 2023-09-05 DIAGNOSIS — I4891 Unspecified atrial fibrillation: Secondary | ICD-10-CM | POA: Diagnosis not present

## 2023-09-05 DIAGNOSIS — D696 Thrombocytopenia, unspecified: Secondary | ICD-10-CM | POA: Diagnosis not present

## 2023-09-05 DIAGNOSIS — G620 Drug-induced polyneuropathy: Secondary | ICD-10-CM | POA: Diagnosis not present

## 2023-09-05 DIAGNOSIS — I700 Atherosclerosis of aorta: Secondary | ICD-10-CM | POA: Diagnosis not present

## 2023-09-05 DIAGNOSIS — I719 Aortic aneurysm of unspecified site, without rupture: Secondary | ICD-10-CM | POA: Diagnosis not present

## 2023-09-05 DIAGNOSIS — I130 Hypertensive heart and chronic kidney disease with heart failure and stage 1 through stage 4 chronic kidney disease, or unspecified chronic kidney disease: Secondary | ICD-10-CM | POA: Diagnosis not present

## 2023-09-05 DIAGNOSIS — I509 Heart failure, unspecified: Secondary | ICD-10-CM | POA: Diagnosis not present

## 2023-09-05 DIAGNOSIS — N1831 Chronic kidney disease, stage 3a: Secondary | ICD-10-CM | POA: Diagnosis not present

## 2023-09-05 DIAGNOSIS — E785 Hyperlipidemia, unspecified: Secondary | ICD-10-CM | POA: Diagnosis not present

## 2023-09-05 LAB — MICROALBUMIN / CREATININE URINE RATIO: Microalb Creat Ratio: <30

## 2023-09-10 ENCOUNTER — Ambulatory Visit: Admitting: Family Medicine

## 2023-10-06 DIAGNOSIS — Z6841 Body Mass Index (BMI) 40.0 and over, adult: Secondary | ICD-10-CM | POA: Diagnosis not present

## 2023-10-06 DIAGNOSIS — M25512 Pain in left shoulder: Secondary | ICD-10-CM | POA: Diagnosis not present

## 2023-10-06 DIAGNOSIS — E1169 Type 2 diabetes mellitus with other specified complication: Secondary | ICD-10-CM | POA: Diagnosis not present

## 2023-10-06 DIAGNOSIS — M19012 Primary osteoarthritis, left shoulder: Secondary | ICD-10-CM | POA: Diagnosis not present

## 2023-10-07 ENCOUNTER — Encounter: Payer: Self-pay | Admitting: Family Medicine

## 2023-10-07 DIAGNOSIS — I10 Essential (primary) hypertension: Secondary | ICD-10-CM

## 2023-10-07 DIAGNOSIS — E039 Hypothyroidism, unspecified: Secondary | ICD-10-CM

## 2023-10-07 DIAGNOSIS — D696 Thrombocytopenia, unspecified: Secondary | ICD-10-CM

## 2023-10-07 DIAGNOSIS — R5383 Other fatigue: Secondary | ICD-10-CM

## 2023-10-07 DIAGNOSIS — E1169 Type 2 diabetes mellitus with other specified complication: Secondary | ICD-10-CM

## 2023-10-07 DIAGNOSIS — I2510 Atherosclerotic heart disease of native coronary artery without angina pectoris: Secondary | ICD-10-CM

## 2023-10-09 ENCOUNTER — Telehealth: Payer: Self-pay

## 2023-10-09 NOTE — Telephone Encounter (Signed)
 I dont know what that means

## 2023-10-09 NOTE — Telephone Encounter (Signed)
 Copied from CRM [id number]. Topic: Clinical - Home Health Verbal Orders >> Oct 08, 2023  5:41 PM Delon DASEN wrote: Caller/Agency: [facility] Salome Callback Number: [phone] Service Requested: yearly preventative care Frequency: n/a Any new concerns about the patient? Yes- CECG patch was applied - abnormal readings

## 2023-10-10 NOTE — Telephone Encounter (Signed)
 Left message for Ryan Ali with [facility] to calls us  back for more information/clarification.

## 2023-10-11 LAB — T4 AND TSH
T4, Total: 8.5 ug/dL (ref 4.5–12.0)
TSH: 0.312 u[IU]/mL — ABNORMAL LOW (ref 0.450–4.500)

## 2023-10-11 LAB — COMPREHENSIVE METABOLIC PANEL WITH GFR
ALT: 10 IU/L (ref 0–44)
AST: 17 IU/L (ref 0–40)
Albumin: 3.6 g/dL — ABNORMAL LOW (ref 3.7–4.7)
Alkaline Phosphatase: 144 IU/L — ABNORMAL HIGH (ref 44–121)
BUN/Creatinine Ratio: 13 (ref 10–24)
BUN: 24 mg/dL (ref 8–27)
Bilirubin Total: 0.9 mg/dL (ref 0.0–1.2)
CO2: 22 mmol/L (ref 20–29)
Calcium: 9.6 mg/dL (ref 8.6–10.2)
Chloride: 103 mmol/L (ref 96–106)
Creatinine, Ser: 1.88 mg/dL — ABNORMAL HIGH (ref 0.76–1.27)
Globulin, Total: 2.6 g/dL (ref 1.5–4.5)
Glucose: 128 mg/dL — ABNORMAL HIGH (ref 70–99)
Potassium: 4.5 mmol/L (ref 3.5–5.2)
Sodium: 141 mmol/L (ref 134–144)
Total Protein: 6.2 g/dL (ref 6.0–8.5)
eGFR: 35 mL/min/1.73 — ABNORMAL LOW (ref >59)

## 2023-10-11 LAB — LIPID PANEL
Chol/HDL Ratio: 3.3 ratio (ref 0.0–5.0)
Cholesterol, Total: 122 mg/dL (ref 100–199)
HDL: 37 mg/dL — ABNORMAL LOW (ref >39)
LDL Chol Calc (NIH): 67 mg/dL (ref 0–99)
Triglycerides: 95 mg/dL (ref 0–149)
VLDL Cholesterol Cal: 18 mg/dL (ref 5–40)

## 2023-10-11 LAB — CBC
Hematocrit: 41.5 % (ref 37.5–51.0)
Hemoglobin: 13.4 g/dL (ref 13.0–17.7)
MCH: 29.7 pg (ref 26.6–33.0)
MCHC: 32.3 g/dL (ref 31.5–35.7)
MCV: 92 fL (ref 79–97)
Platelets: 193 x10E3/uL (ref 150–450)
RBC: 4.51 x10E6/uL (ref 4.14–5.80)
RDW: 13.9 % (ref 11.6–15.4)
WBC: 7.3 x10E3/uL (ref 3.4–10.8)

## 2023-10-11 LAB — HEMOGLOBIN A1C
Est. average glucose Bld gHb Est-mCnc: 151 mg/dL
Hgb A1c MFr Bld: 6.9 % — ABNORMAL HIGH (ref 4.8–5.6)

## 2023-10-13 NOTE — Telephone Encounter (Signed)
 Called and LVM with [facility] for clarification.

## 2023-10-14 ENCOUNTER — Ambulatory Visit (INDEPENDENT_AMBULATORY_CARE_PROVIDER_SITE_OTHER)

## 2023-10-14 ENCOUNTER — Other Ambulatory Visit (INDEPENDENT_AMBULATORY_CARE_PROVIDER_SITE_OTHER)

## 2023-10-14 ENCOUNTER — Ambulatory Visit (INDEPENDENT_AMBULATORY_CARE_PROVIDER_SITE_OTHER): Admitting: Nurse Practitioner

## 2023-10-14 ENCOUNTER — Encounter (INDEPENDENT_AMBULATORY_CARE_PROVIDER_SITE_OTHER): Payer: Self-pay | Admitting: Nurse Practitioner

## 2023-10-14 VITALS — BP 109/67 | HR 81 | Resp 16 | Ht 67.0 in | Wt 220.4 lb

## 2023-10-14 DIAGNOSIS — I70213 Atherosclerosis of native arteries of extremities with intermittent claudication, bilateral legs: Secondary | ICD-10-CM

## 2023-10-14 DIAGNOSIS — I7143 Infrarenal abdominal aortic aneurysm, without rupture: Secondary | ICD-10-CM

## 2023-10-14 DIAGNOSIS — M7989 Other specified soft tissue disorders: Secondary | ICD-10-CM

## 2023-10-14 DIAGNOSIS — E1159 Type 2 diabetes mellitus with other circulatory complications: Secondary | ICD-10-CM | POA: Diagnosis not present

## 2023-10-14 DIAGNOSIS — I10 Essential (primary) hypertension: Secondary | ICD-10-CM
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
## 2023-10-14 NOTE — Telephone Encounter (Signed)
 We have called Garrel with [employer] and unable to get a hold of him. Closing encounter.

## 2023-10-15 ENCOUNTER — Ambulatory Visit: Payer: Self-pay | Admitting: Family Medicine

## 2023-10-15 ENCOUNTER — Encounter: Payer: Self-pay | Admitting: Family Medicine

## 2023-10-15 ENCOUNTER — Ambulatory Visit: Admitting: Family Medicine

## 2023-10-15 VITALS — BP 95/63 | HR 84 | Resp 16 | Ht 67.0 in | Wt 221.8 lb

## 2023-10-15 DIAGNOSIS — R7303 Prediabetes: Secondary | ICD-10-CM | POA: Diagnosis not present

## 2023-10-15 DIAGNOSIS — J441 Chronic obstructive pulmonary disease with (acute) exacerbation: Secondary | ICD-10-CM

## 2023-10-15 DIAGNOSIS — R600 Localized edema: Secondary | ICD-10-CM | POA: Diagnosis not present

## 2023-10-15 DIAGNOSIS — E890 Postprocedural hypothyroidism: Secondary | ICD-10-CM | POA: Diagnosis not present

## 2023-10-15 DIAGNOSIS — M5030 Other cervical disc degeneration, unspecified cervical region: Secondary | ICD-10-CM

## 2023-10-15 DIAGNOSIS — G9009 Other idiopathic peripheral autonomic neuropathy: Secondary | ICD-10-CM | POA: Diagnosis not present

## 2023-10-15 DIAGNOSIS — E039 Hypothyroidism, unspecified: Secondary | ICD-10-CM

## 2023-10-15 MED ORDER — HYDROCODONE-ACETAMINOPHEN 5-325 MG PO TABS: 1.0000 | ORAL_TABLET | Freq: Four times a day (QID) | ORAL | 0 refills | Status: DC | PRN

## 2023-10-15 MED ORDER — TRAMADOL HCL 50 MG PO TABS
ORAL_TABLET | ORAL | 3 refills | Status: DC
Start: 2023-10-15 — End: 2024-01-27

## 2023-10-15 MED ORDER — LEVOTHYROXINE SODIUM 137 MCG PO TABS: 137.0000 ug | ORAL_TABLET | Freq: Every day | ORAL | 1 refills | Status: DC

## 2023-10-15 MED ORDER — GABAPENTIN 300 MG PO CAPS: 300.0000 mg | ORAL_CAPSULE | Freq: Two times a day (BID) | ORAL | 5 refills | Status: AC

## 2023-10-15 NOTE — Patient Instructions (Signed)
 Marland Kitchen  Please review the attached list of medications and notify my office if there are any errors.   . Please bring all of your medications to every appointment so we can make sure that our medication list is the same as yours.

## 2023-10-15 NOTE — Progress Notes (Signed)
 Established patient visit   Patient: Ryan Ali   DOB: 1940-05-15   83 y.o. Male  MRN: [id number] Visit Date: 10/15/2023  Today's healthcare provider: Nancyann Perry, MD   Chief Complaint  Patient presents with   Back Pain    working out in the yard complained of lower back pain onset 1 week states of no pain today   Foot Pain    Bilateral foot pain neuropathy ongoing for years   Fatigue    Per wife his tired all the time   Subjective    Discussed the use of AI scribe software for clinical note transcription with the patient, who gave verbal consent to proceed.  History of Present Illness   Ryan Ali is an 83 year old male with chronic kidney disease who presents for routine follow up and with bilateral foot and ankle swelling and dizziness.  He has significant swelling in his ankle, persistent for at least a year. He is on two different diuretics, but his kidney function has been affected, with a GFR of 35. He experiences dizziness frequently and has blood pressure readings around 90/50 mmHg. He has been off amlodipine for a couple of months due to low blood pressure and dizziness.  He is followed at [facility] for most of his chronic medication problems  He has a history of breathing difficulties and uses oxygen  at home as needed. Fluid retention exacerbates his breathing issues. He underwent an echocardiogram about a year and a half ago, which showed normal heart function. He is scheduled to see his [place] doctor soon for further evaluation. He is followed by vascular for AAA. Was seen yesterday and discussed use of compression stocking and lower leg pumps.   He experiences neuropathy in his feet, worsening at night. He takes gabapentin  300 mg twice daily, tramadol  at night, and Vicodin for back pain.  His TSH levels were noted to be low. He is currently on 150 mcg of thyroid  medication. No recent changes in diet. He notes a lack of appetite at times.     Lab Results   Component Value Date   NA 141 10/10/2023   K 4.5 10/10/2023   CREATININE 1.88 (H) 10/10/2023   EGFR 35 (L) 10/10/2023   GLUCOSE 128 (H) 10/10/2023   Last thyroid  functions Lab Results  Component Value Date   TSH 0.312 (L) 10/10/2023   T4TOTAL 8.5 10/10/2023   Lab Results  Component Value Date   HGBA1C 6.9 (H) 10/10/2023   HGBA1C 6.2 (H) 01/09/2022   HGBA1C 6.0 (H) 01/17/2021      Medications: Outpatient Medications Prior to Visit  Medication Sig Note   albuterol  (VENTOLIN  HFA) 108 (90 Base) MCG/ACT inhaler Inhale 2 puffs into the lungs every 6 (six) hours as needed for shortness of breath or wheezing.    Ascorbic Acid  (VITAMIN C  PO) Take 1,400 mg by mouth daily.    aspirin  EC 81 MG tablet Take 81 mg by mouth daily. Swallow whole.    ASTAXANTHIN PO Take 12 mg by mouth daily.    atorvastatin  (LIPITOR) 20 MG tablet TAKE 1 TABLET BY MOUTH EVERY DAY    baclofen  (LIORESAL ) 10 MG tablet Take 10 mg by mouth as needed for muscle spasms.    brimonidine  (ALPHAGAN ) 0.2 % ophthalmic solution Place 1 drop into the right eye in the morning and at bedtime.    Calcium  Carbonate-Vit D-Min (CALCIUM  600+D PLUS MINERALS) 600-400 MG-UNIT CHEW Chew 3 tablets by mouth  daily.    Cholecalciferol  (VITAMIN D  PO) Take 1 tablet by mouth daily. 2000 mg    Elastic Bandages & Supports (MEDICAL COMPRESSION SOCKS) MISC Put on compression socks in the AM as soon as you wake up    Fluticasone-Salmeterol (WIXELA INHUB IN) Inhale into the lungs daily.    furosemide  (LASIX ) 20 MG tablet TAKE 1 TABLET BY MOUTH EVERY OTHER DAY    gabapentin  (NEURONTIN ) 300 MG capsule Take 300 mg by mouth 2 (two) times daily.    HYDROcodone -acetaminophen  (NORCO/VICODIN) 5-325 MG tablet Take 1 tablet by mouth every 6 (six) hours as needed for moderate pain (pain score 4-6).    Magnesium  500 MG CAPS Take by mouth daily.    montelukast  (SINGULAIR ) 10 MG tablet TAKE 1 TABLET BY MOUTH EVERYDAY AT BEDTIME    omeprazole (PRILOSEC) 20 MG  capsule Take 20 mg by mouth daily.    tamsulosin  (FLOMAX ) 0.4 MG CAPS capsule TAKE 1 CAPSULE BY MOUTH EVERY DAY    Tiotropium Bromide  Monohydrate 1.25 MCG/ACT AERS INHALE 2 INHALATIONS ORAL INHALATION EVERY MORNING *REPLACES TIOTROPIUM HANDIHALER*    tiZANidine (ZANAFLEX) 2 MG tablet Take 2 mg by mouth once.    traMADol  (ULTRAM ) 50 MG tablet Take by mouth every 6 (six) hours as needed.    levothyroxine  (SYNTHROID ) 150 MCG tablet TAKE 1 TABLET BY MOUTH EVERY DAY    [DISCONTINUED] amLODipine (NORVASC) 10 MG tablet Take 5 mg by mouth daily. (Patient not taking: Reported on 10/15/2023) 10/15/2023: pt already stopped due to swelling and dizziness   [DISCONTINUED] atenolol  (TENORMIN ) 25 MG tablet TAKE 1 TABLET(25 MG) BY MOUTH EVERY MORNING (Patient not taking: Reported on 10/15/2023) 10/15/2023: pt already stopped due to swelling and dizziness   [DISCONTINUED] budesonide-formoterol  (SYMBICORT) 160-4.5 MCG/ACT inhaler Inhale 2 puffs into the lungs 2 (two) times daily. (Patient not taking: Reported on 10/15/2023) 10/15/2023: pt already stopped due to swelling and dizziness   No facility-administered medications prior to visit.   Review of Systems  Constitutional:  Negative for appetite change, chills and fever.  Respiratory:  Negative for chest tightness, shortness of breath and wheezing.   Cardiovascular:  Negative for chest pain and palpitations.  Gastrointestinal:  Negative for abdominal pain, nausea and vomiting.       Objective    BP 95/63 (BP Location: Left Arm, Patient Position: Sitting, Cuff Size: Large)   Pulse 84   Resp 16   Ht 5' 7 (1.702 m)   Wt 221 lb 12.8 oz (100.6 kg)   SpO2 97%   BMI 34.74 kg/m   Physical Exam   General: Appearance:    Mildly obese male in no acute distress  Eyes:    PERRL, conjunctiva/corneas clear, EOM's intact       Lungs:     Clear to auscultation bilaterally, respirations unlabored  Heart:    Normal heart rate. Normal rhythm. No murmurs, rubs, or gallops.     MS:   All extremities are intact.  3+ bipedal and ankle edema. Some superficial varicosities.   Neurologic:   Awake, alert, oriented x 3. No apparent focal neurological defect.         Assessment & Plan    1. Hypothyroidism, unspecified type (Primary) Slightly hyperthyroid per recent labs, reduce from to - levothyroxine  (SYNTHROID ) 137 MCG tablet; Take 1 tablet (137 mcg total) by mouth daily.  Dispense: 90 tablet; Refill: 1  2. Prediabetes A1c now up to 6.7. work on diet. Follow up at [facility] as scheduled.  3. Other idiopathic peripheral autonomic neuropathy Unable to get gabapentin  through [place] any longer.  - gabapentin  (NEURONTIN ) 300 MG capsule; Take 1 capsule (300 mg total) by mouth 2 (two) times daily.  Dispense: 60 capsule; Refill: 5  4. DDD (degenerative disc disease), cervical Refill.  - HYDROcodone -acetaminophen  (NORCO/VICODIN) 5-325 MG tablet; Take 1 tablet by mouth every 6 (six) hours as needed for moderate pain (pain score 4-6).  Dispense: 30 tablet; Refill: 0 - traMADol  (ULTRAM ) 50 MG tablet; Take by mouth every 6 (six) hours as needed.  Dispense: 60 tablet; Refill: 3  5. Bilateral leg edema Negative cardiac workup at [facility]. Stable renal and hepatic functions. Likely secondary to venous stasis. Compression stocks per recommendations by vascular.   Unable to walk for long distances due to above chronic conditions. Completed application for disability parking permit.   6. COPD Continue regular follow up Dr. Parris Nancyann Perry, MD  [facility] [phone] (phone) [phone] (fax)  [facility]

## 2023-10-20 LAB — VAS US ABI WITH/WO TBI
Left ABI: 1.1
Right ABI: 0.96

## 2023-10-22 ENCOUNTER — Encounter: Payer: Self-pay | Admitting: Family Medicine

## 2023-10-28 DIAGNOSIS — N1832 Chronic kidney disease, stage 3b: Secondary | ICD-10-CM | POA: Diagnosis not present

## 2023-10-28 DIAGNOSIS — R918 Other nonspecific abnormal finding of lung field: Secondary | ICD-10-CM | POA: Diagnosis not present

## 2023-10-28 DIAGNOSIS — J449 Chronic obstructive pulmonary disease, unspecified: Secondary | ICD-10-CM | POA: Diagnosis not present

## 2023-10-28 DIAGNOSIS — J9611 Chronic respiratory failure with hypoxia: Secondary | ICD-10-CM | POA: Diagnosis not present

## 2023-11-03 ENCOUNTER — Encounter: Payer: Self-pay | Admitting: Family Medicine

## 2023-11-03 DIAGNOSIS — I471 Supraventricular tachycardia, unspecified: Secondary | ICD-10-CM | POA: Insufficient documentation

## 2023-11-12 DIAGNOSIS — J449 Chronic obstructive pulmonary disease, unspecified: Secondary | ICD-10-CM | POA: Diagnosis not present

## 2023-11-28 DIAGNOSIS — H40002 Preglaucoma, unspecified, left eye: Secondary | ICD-10-CM | POA: Diagnosis not present

## 2023-11-28 DIAGNOSIS — H401111 Primary open-angle glaucoma, right eye, mild stage: Secondary | ICD-10-CM | POA: Diagnosis not present

## 2023-12-02 DIAGNOSIS — H11003 Unspecified pterygium of eye, bilateral: Secondary | ICD-10-CM | POA: Diagnosis not present

## 2023-12-02 DIAGNOSIS — Z961 Presence of intraocular lens: Secondary | ICD-10-CM | POA: Diagnosis not present

## 2023-12-02 DIAGNOSIS — H401111 Primary open-angle glaucoma, right eye, mild stage: Secondary | ICD-10-CM | POA: Diagnosis not present

## 2023-12-02 DIAGNOSIS — H40002 Preglaucoma, unspecified, left eye: Secondary | ICD-10-CM | POA: Diagnosis not present

## 2023-12-05 ENCOUNTER — Encounter: Payer: Self-pay | Admitting: Family Medicine

## 2023-12-05 DIAGNOSIS — I10 Essential (primary) hypertension: Secondary | ICD-10-CM

## 2023-12-08 MED ORDER — AMLODIPINE BESYLATE 5 MG PO TABS: 5.0000 mg | ORAL_TABLET | Freq: Every day | ORAL | 3 refills | Status: AC

## 2023-12-22 ENCOUNTER — Ambulatory Visit (INDEPENDENT_AMBULATORY_CARE_PROVIDER_SITE_OTHER): Admitting: Physician Assistant

## 2023-12-22 DIAGNOSIS — R0602 Shortness of breath: Secondary | ICD-10-CM | POA: Diagnosis not present

## 2023-12-22 DIAGNOSIS — K219 Gastro-esophageal reflux disease without esophagitis: Secondary | ICD-10-CM

## 2023-12-22 DIAGNOSIS — I10 Essential (primary) hypertension: Secondary | ICD-10-CM

## 2023-12-22 DIAGNOSIS — J441 Chronic obstructive pulmonary disease with (acute) exacerbation: Secondary | ICD-10-CM

## 2023-12-22 MED ORDER — DOXYCYCLINE HYCLATE 100 MG PO TABS: 100.0000 mg | ORAL_TABLET | Freq: Two times a day (BID) | ORAL | 0 refills | Status: DC

## 2023-12-22 MED ORDER — PREDNISONE 20 MG PO TABS: 20.0000 mg | ORAL_TABLET | Freq: Every day | ORAL | 0 refills | Status: AC

## 2023-12-22 NOTE — Progress Notes (Signed)
 Established patient visit  Patient: Ryan Ali   DOB: November 04, 1940   83 y.o. Male  MRN: [id number] Visit Date: 12/22/2023  Today's healthcare provider: Jolynn Spencer, PA-C   Chief Complaint  Patient presents with   Nasal Congestion    Patient started with cough 3 weeks ago.  He has been taking Mucinex  cough. Cough got no better and it is productive of yellow sputum   Subjective     HPI     Nasal Congestion    Additional comments: Patient started with cough 3 weeks ago.  He has been taking Mucinex  cough. Cough got no better and it is productive of yellow sputum      Last edited by Kathi Buel BIRCH, CMA on 12/22/2023  9:33 AM.       Discussed the use of AI scribe software for clinical note transcription with the patient, who gave verbal consent to proceed.  History of Present Illness Ryan Ali is an 83 year old male with COPD who presents with persistent cough and yellow sputum production. He is accompanied by his spouse, who is actively involved in his care.  He has experienced a persistent cough with yellow sputum for three weeks. There is no fever or chills. Shortness of breath and a sore throat are present.  He uses Breztri inhaler twice daily, a rescue inhaler every four to six hours, and a nebulizer as needed. Roflumilast is taken once daily. Despite these treatments, symptoms persist. Mucinex  is administered every twelve hours for mucus expectoration. No decongestant is used.  In the past, he was treated with a Z-Pak and prednisone  for similar symptoms, but this episode is more severe. His spouse inquires about using prednisone  to reduce inflammation and facilitate mucus clearance.       10/15/2023    1:46 PM 02/12/2022    2:10 PM 04/19/2021    1:34 PM  Depression screen PHQ 2/9  Decreased Interest 0 0 0  Down, Depressed, Hopeless 0 0 0  PHQ - 2 Score 0 0 0  Altered sleeping 1 0 2  Tired, decreased energy 2 0 1  Change in appetite 2 0   Feeling  bad or failure about yourself  0 0 0  Trouble concentrating 0 0 0  Moving slowly or fidgety/restless 0 0   Suicidal thoughts 0 0 0  PHQ-9 Score 5 0 3  Difficult doing work/chores Not difficult at all Not difficult at all Not difficult at all      10/15/2023    1:46 PM  GAD 7 : Generalized Anxiety Score  Nervous, Anxious, on Edge 0  Control/stop worrying 0  Worry too much - different things 0  Trouble relaxing 0  Restless 0  Easily annoyed or irritable 0  Afraid - awful might happen 0  Total GAD 7 Score 0  Anxiety Difficulty Not difficult at all    Medications: Outpatient Medications Prior to Visit  Medication Sig   albuterol  (VENTOLIN  HFA) 108 (90 Base) MCG/ACT inhaler Inhale 2 puffs into the lungs every 6 (six) hours as needed for shortness of breath or wheezing.   amLODipine  (NORVASC ) 5 MG tablet Take 1 tablet (5 mg total) by mouth daily.   Ascorbic Acid  (VITAMIN C  PO) Take 1,400 mg by mouth daily.   aspirin  EC 81 MG tablet Take 81 mg by mouth daily. Swallow whole.   ASTAXANTHIN PO Take 12 mg by mouth daily.   atorvastatin  (LIPITOR) 20 MG tablet TAKE 1 TABLET BY MOUTH EVERY  DAY   baclofen  (LIORESAL ) 10 MG tablet Take 10 mg by mouth as needed for muscle spasms.   brimonidine  (ALPHAGAN ) 0.2 % ophthalmic solution Place 1 drop into the right eye in the morning and at bedtime.   budesonide-glycopyrrolate -formoterol  (BREZTRI AEROSPHERE) 160-9-4.8 MCG/ACT AERO inhaler Inhale 2 puffs into the lungs 2 (two) times daily.   Calcium  Carbonate-Vit D-Min (CALCIUM  600+D PLUS MINERALS) 600-400 MG-UNIT CHEW Chew 3 tablets by mouth daily.   Cholecalciferol  (VITAMIN D  PO) Take 1 tablet by mouth daily. 2000 mg   Elastic Bandages & Supports (MEDICAL COMPRESSION SOCKS) MISC Put on compression socks in the AM as soon as you wake up   furosemide  (LASIX ) 20 MG tablet TAKE 1 TABLET BY MOUTH EVERY OTHER DAY   gabapentin  (NEURONTIN ) 300 MG capsule Take 1 capsule (300 mg total) by mouth 2 (two) times  daily.   HYDROcodone -acetaminophen  (NORCO/VICODIN) 5-325 MG tablet Take 1 tablet by mouth every 6 (six) hours as needed for moderate pain (pain score 4-6).   levothyroxine  (SYNTHROID ) 137 MCG tablet Take 1 tablet (137 mcg total) by mouth daily.   Magnesium  500 MG CAPS Take by mouth daily.   montelukast  (SINGULAIR ) 10 MG tablet TAKE 1 TABLET BY MOUTH EVERYDAY AT BEDTIME   omeprazole (PRILOSEC) 20 MG capsule Take 20 mg by mouth daily.   roflumilast (DALIRESP) 500 MCG TABS tablet Take 500 mcg by mouth daily.   tamsulosin  (FLOMAX ) 0.4 MG CAPS capsule TAKE 1 CAPSULE BY MOUTH EVERY DAY   Tiotropium Bromide  Monohydrate 1.25 MCG/ACT AERS INHALE 2 INHALATIONS ORAL INHALATION EVERY MORNING *REPLACES TIOTROPIUM HANDIHALER*   tiZANidine (ZANAFLEX) 2 MG tablet Take 2 mg by mouth once.   traMADol  (ULTRAM ) 50 MG tablet Take by mouth every 6 (six) hours as needed.   Fluticasone-Salmeterol (WIXELA INHUB IN) Inhale into the lungs daily.   No facility-administered medications prior to visit.    Review of Systems  All other systems reviewed and are negative.  All negative Except see HPI   {Insert previous labs (optional):23779} {See past labs  Heme  Chem  Endocrine  Serology  Results Review (optional):1}   Objective    BP 104/68 (BP Location: Left Arm, Patient Position: Sitting, Cuff Size: Normal)   Temp 98.6 F (37 C) (Oral)   Ht 5' 7 (1.702 m)   Wt 207 lb (93.9 kg)   SpO2 98% Comment: on 2lpm O2, Room air 89  BMI 32.42 kg/m  {Insert last BP/Wt (optional):23777}{See vitals history (optional):1}   Physical Exam Vitals reviewed.  Constitutional:      General: He is not in acute distress.    Appearance: Normal appearance. He is not ill-appearing, toxic-appearing or diaphoretic.  HENT:     Head: Normocephalic and atraumatic.     Right Ear: Tympanic membrane, ear canal and external ear normal.     Left Ear: Tympanic membrane, ear canal and external ear normal.     Nose: Congestion and  rhinorrhea present.     Mouth/Throat:     Pharynx: Posterior oropharyngeal erythema present.  Eyes:     General: No scleral icterus.       Right eye: No discharge.        Left eye: No discharge.     Extraocular Movements: Extraocular movements intact.     Conjunctiva/sclera: Conjunctivae normal.     Pupils: Pupils are equal, round, and reactive to light.  Cardiovascular:     Rate and Rhythm: Normal rate and regular rhythm.     Pulses: Normal  pulses.     Heart sounds: Normal heart sounds. No murmur heard. Pulmonary:     Effort: Pulmonary effort is normal. No respiratory distress.     Breath sounds: Normal breath sounds. No wheezing or rhonchi.  Abdominal:     General: Abdomen is flat. Bowel sounds are normal.     Palpations: Abdomen is soft.  Musculoskeletal:        General: Normal range of motion.     Cervical back: Normal range of motion and neck supple.     Right lower leg: No edema.     Left lower leg: No edema.  Lymphadenopathy:     Cervical: No cervical adenopathy.  Skin:    General: Skin is warm and dry.     Findings: No rash.  Neurological:     General: No focal deficit present.     Mental Status: He is alert and oriented to person, place, and time. Mental status is at baseline.  Psychiatric:        Mood and Affect: Mood normal.        Behavior: Behavior normal.        Thought Content: Thought content normal.      No results found for any visits on 12/22/23.  Assessment & Plan Chronic obstructive pulmonary disease with acute exacerbation COPD exacerbation with persistent cough and yellow sputum. No fever or chills, reducing pneumonia likelihood. Shortness of breath present. Doxycycline chosen over azithromycin  due to age and treatment preferences. - Prescribed doxycycline for 5-7 days. Discontinue if symptoms improve in 5 days. - Prescribed prednisone  for 5 days. - Continue Breztri and rescue inhaler every 4-6 hours. - Use nebulizer as needed. - Continue  Mucinex  and ensure hydration. - Communicate with pulmonologist if no improvement. - Consider chest X-ray if symptoms persist or worsen.  ?  Orders Placed This Encounter  Procedures   EKG 12-Lead    No follow-ups on file.   The patient was advised to call back or seek an in-person evaluation if the symptoms worsen or if the condition fails to improve as anticipated.  I discussed the assessment and treatment plan with the patient. The patient was provided an opportunity to ask questions and all were answered. The patient agreed with the plan and demonstrated an understanding of the instructions.  I, Kadia Abaya, PA-C have reviewed all documentation for this visit. The documentation on 12/22/2023  for the exam, diagnosis, procedures, and orders are all accurate and complete.  Jolynn Spencer, [facility], MMS [facility] [phone] (phone) [phone] (fax)  [facility]

## 2023-12-24 ENCOUNTER — Encounter: Payer: Self-pay | Admitting: Physician Assistant

## 2024-01-26 ENCOUNTER — Encounter: Payer: Self-pay | Admitting: Family Medicine

## 2024-01-26 DIAGNOSIS — M5030 Other cervical disc degeneration, unspecified cervical region: Secondary | ICD-10-CM

## 2024-01-27 MED ORDER — TRAMADOL HCL 50 MG PO TABS: ORAL_TABLET | ORAL | 3 refills | Status: AC

## 2024-01-27 MED ORDER — HYDROCODONE-ACETAMINOPHEN 5-325 MG PO TABS: 1.0000 | ORAL_TABLET | Freq: Four times a day (QID) | ORAL | 0 refills | Status: AC | PRN

## 2024-02-04 DIAGNOSIS — M1711 Unilateral primary osteoarthritis, right knee: Secondary | ICD-10-CM | POA: Diagnosis not present

## 2024-02-11 DIAGNOSIS — M1711 Unilateral primary osteoarthritis, right knee: Secondary | ICD-10-CM | POA: Diagnosis not present

## 2024-02-18 DIAGNOSIS — M1711 Unilateral primary osteoarthritis, right knee: Secondary | ICD-10-CM | POA: Diagnosis not present

## 2024-03-11 ENCOUNTER — Other Ambulatory Visit: Payer: Self-pay | Admitting: Family Medicine

## 2024-03-11 DIAGNOSIS — E039 Hypothyroidism, unspecified: Secondary | ICD-10-CM

## 2024-10-14 ENCOUNTER — Other Ambulatory Visit (INDEPENDENT_AMBULATORY_CARE_PROVIDER_SITE_OTHER)

## 2024-10-14 ENCOUNTER — Encounter (INDEPENDENT_AMBULATORY_CARE_PROVIDER_SITE_OTHER)

## 2024-10-14 ENCOUNTER — Ambulatory Visit (INDEPENDENT_AMBULATORY_CARE_PROVIDER_SITE_OTHER): Admitting: Vascular Surgery
